# Patient Record
Sex: Female | Born: 1945 | Race: White | Hispanic: No | Marital: Married | Smoking: Never smoker
Health system: Southern US, Community
[De-identification: ages and names within clinical notes are randomized; demographics above are authoritative.]

## PROBLEM LIST (undated history)

## (undated) DIAGNOSIS — M4800 Spinal stenosis, site unspecified: Secondary | ICD-10-CM

## (undated) DIAGNOSIS — M1990 Unspecified osteoarthritis, unspecified site: Secondary | ICD-10-CM

## (undated) DIAGNOSIS — R55 Syncope and collapse: Secondary | ICD-10-CM

## (undated) DIAGNOSIS — R251 Tremor, unspecified: Secondary | ICD-10-CM

## (undated) DIAGNOSIS — E785 Hyperlipidemia, unspecified: Secondary | ICD-10-CM

## (undated) DIAGNOSIS — A879 Viral meningitis, unspecified: Secondary | ICD-10-CM

## (undated) DIAGNOSIS — E063 Autoimmune thyroiditis: Secondary | ICD-10-CM

## (undated) DIAGNOSIS — E039 Hypothyroidism, unspecified: Secondary | ICD-10-CM

## (undated) DIAGNOSIS — Z9889 Other specified postprocedural states: Secondary | ICD-10-CM

## (undated) DIAGNOSIS — C50919 Malignant neoplasm of unspecified site of unspecified female breast: Secondary | ICD-10-CM

## (undated) HISTORY — PX: BREAST SURGERY: SHX581

## (undated) HISTORY — DX: Viral meningitis, unspecified: A87.9

## (undated) HISTORY — DX: Hypothyroidism, unspecified: E03.9

## (undated) HISTORY — PX: FOOT SURGERY: SHX648

## (undated) HISTORY — PX: TONSILLECTOMY: SUR1361

## (undated) HISTORY — DX: Hyperlipidemia, unspecified: E78.5

## (undated) HISTORY — DX: Spinal stenosis, site unspecified: M48.00

## (undated) HISTORY — DX: Unspecified osteoarthritis, unspecified site: M19.90

## (undated) HISTORY — DX: Autoimmune thyroiditis: E06.3

## (undated) HISTORY — DX: Tremor, unspecified: R25.1

## (undated) HISTORY — DX: Other specified postprocedural states: Z98.890

---

## 1983-05-20 DIAGNOSIS — Z9889 Other specified postprocedural states: Secondary | ICD-10-CM

## 1983-05-20 HISTORY — DX: Other specified postprocedural states: Z98.890

## 1983-05-20 HISTORY — PX: KNEE ARTHROSCOPY: SHX127

## 2004-05-19 HISTORY — PX: CHOLECYSTECTOMY: SHX55

## 2004-05-22 ENCOUNTER — Ambulatory Visit: Payer: Self-pay | Admitting: Internal Medicine

## 2004-05-27 ENCOUNTER — Ambulatory Visit: Payer: Self-pay | Admitting: Surgery

## 2004-06-17 ENCOUNTER — Ambulatory Visit: Payer: Self-pay | Admitting: Surgery

## 2004-06-19 ENCOUNTER — Ambulatory Visit: Payer: Self-pay | Admitting: Surgery

## 2005-05-05 ENCOUNTER — Ambulatory Visit: Payer: Self-pay | Admitting: Specialist

## 2005-05-13 ENCOUNTER — Ambulatory Visit: Payer: Self-pay | Admitting: Specialist

## 2005-07-01 ENCOUNTER — Ambulatory Visit: Payer: Self-pay | Admitting: Internal Medicine

## 2006-07-16 ENCOUNTER — Ambulatory Visit: Payer: Self-pay | Admitting: Internal Medicine

## 2007-01-28 ENCOUNTER — Ambulatory Visit: Payer: Self-pay | Admitting: Internal Medicine

## 2007-06-25 ENCOUNTER — Ambulatory Visit: Payer: Self-pay | Admitting: Unknown Physician Specialty

## 2007-07-02 LAB — HM COLONOSCOPY

## 2007-07-19 ENCOUNTER — Ambulatory Visit: Payer: Self-pay | Admitting: Internal Medicine

## 2008-07-20 ENCOUNTER — Ambulatory Visit: Payer: Self-pay | Admitting: Internal Medicine

## 2009-08-16 ENCOUNTER — Ambulatory Visit: Payer: Self-pay | Admitting: Internal Medicine

## 2010-02-25 LAB — HM DEXA SCAN

## 2010-02-26 ENCOUNTER — Other Ambulatory Visit: Payer: Self-pay | Admitting: Internal Medicine

## 2010-02-27 ENCOUNTER — Ambulatory Visit: Payer: Self-pay | Admitting: Internal Medicine

## 2010-03-11 ENCOUNTER — Other Ambulatory Visit: Payer: Self-pay | Admitting: Internal Medicine

## 2010-05-22 ENCOUNTER — Other Ambulatory Visit: Payer: Self-pay | Admitting: Internal Medicine

## 2010-08-14 ENCOUNTER — Ambulatory Visit: Payer: Self-pay | Admitting: Internal Medicine

## 2011-02-18 ENCOUNTER — Other Ambulatory Visit: Payer: Self-pay | Admitting: Internal Medicine

## 2011-03-26 ENCOUNTER — Other Ambulatory Visit: Payer: Self-pay

## 2011-04-02 ENCOUNTER — Ambulatory Visit (INDEPENDENT_AMBULATORY_CARE_PROVIDER_SITE_OTHER): Payer: PRIVATE HEALTH INSURANCE | Admitting: Internal Medicine

## 2011-04-02 ENCOUNTER — Encounter: Payer: Self-pay | Admitting: Internal Medicine

## 2011-04-02 VITALS — BP 110/60 | HR 79 | Temp 98.4°F | Resp 14 | Ht 67.5 in | Wt 158.5 lb

## 2011-04-02 DIAGNOSIS — M8580 Other specified disorders of bone density and structure, unspecified site: Secondary | ICD-10-CM

## 2011-04-02 DIAGNOSIS — E785 Hyperlipidemia, unspecified: Secondary | ICD-10-CM

## 2011-04-02 DIAGNOSIS — G250 Essential tremor: Secondary | ICD-10-CM

## 2011-04-02 DIAGNOSIS — Z124 Encounter for screening for malignant neoplasm of cervix: Secondary | ICD-10-CM

## 2011-04-02 DIAGNOSIS — M48061 Spinal stenosis, lumbar region without neurogenic claudication: Secondary | ICD-10-CM

## 2011-04-02 DIAGNOSIS — M899 Disorder of bone, unspecified: Secondary | ICD-10-CM

## 2011-04-02 DIAGNOSIS — M949 Disorder of cartilage, unspecified: Secondary | ICD-10-CM

## 2011-04-02 DIAGNOSIS — E559 Vitamin D deficiency, unspecified: Secondary | ICD-10-CM

## 2011-04-02 NOTE — Progress Notes (Signed)
Subjective:    Patient ID: Tiffany Chang, female    DOB: 05/19/1946, 66 y.o.   MRN: [id number]  HPI   Past Medical History  Diagnosis Date  . Osteoporosis   . History of arthroscopy of left knee 1985  . Spinal stenosis     mild  . Hyperlipidemia     mild, with prior statin intolerance   Current Outpatient Prescriptions on File Prior to Visit  Medication Sig Dispense Refill  . SYNTHROID 75 MCG tablet TAKE 1 TABLET EVERY DAY  30 tablet  3      Review of Systems    BP 110/60  Pulse 79  Temp(Src) 98.4 F (36.9 C) (Oral)  Resp 14  Ht 5' 7.5" (1.715 m)  Wt 158 lb 8 oz (71.895 kg)  BMI 24.46 kg/m2  SpO2 96%     Physical Exam    Assessment & Plan:   Subjective:    Tiffany Chang is a 65 y.o. female who presents for an annual exam. The patient is sexually active. GYN screening history: last pap: was normal. The patient wears seatbelts: yes. The patient participates in regular exercise: yes. Has the patient ever been transfused or tattooed?: no. The patient reports that there is not domestic violence in her life.  Last seen one year ago.  History of spinal stenosis getting more problematic. Sees T[facility] Chang.  Has periods of daily pain followed by pain free periods.  Teaching her core strength training classes does not aggravate it. Essential tremor is not getting worse, not chronically bothering her hand writing, and she remains able to drink soup out of right hand . Using propanolol.  Taking crestor for hyperlipidemia every other day or every 3 days without myalgias. Needs repeat lipids .  Annual eye exam by Tiffany Chang : vitreous degeneration and hypermetropia.  Last colonoscopy was 2009 by Tiffany Chang was clear , not due yet.   Menstrual History: OB History    Grav Para Term Preterm Abortions TAB SAB Ect Mult Living                   No LMP recorded. Patient is postmenopausal.    The following portions of the patient's history were reviewed and updated as appropriate: allergies,  current medications, past family history, past medical history, past social history, past surgical history and problem list.  Review of Systems A comprehensive review of systems was negative.    Objective:    BP 110/60  Pulse 79  Temp(Src) 98.4 F (36.9 C) (Oral)  Resp 14  Ht 5' 7.5" (1.715 m)  Wt 158 lb 8 oz (71.895 kg)  BMI 24.46 kg/m2  SpO2 96%  General Appearance:    Alert, cooperative, no distress, appears stated age  Head:    Normocephalic, without obvious abnormality, atraumatic  Eyes:    PERRL, conjunctiva/corneas clear, EOM's intact, fundi    benign, both eyes  Ears:    Normal TM's and external ear canals, both ears  Nose:   Nares normal, septum midline, mucosa normal, no drainage    or sinus tenderness  Throat:   Lips, mucosa, and tongue normal; teeth and gums normal  Neck:   Supple, symmetrical, trachea midline, no adenopathy;    thyroid:  no enlargement/tenderness/nodules; no carotid   bruit or JVD  Back:     Symmetric, no curvature, ROM normal, no CVA tenderness  Lungs:     Clear to auscultation bilaterally, respirations unlabored  Chest Wall:    No  tenderness or deformity   Heart:    Regular rate and rhythm, S1 and S2 normal, no murmur, rub   or gallop  Breast Exam:    No tenderness, masses, or nipple abnormality  Abdomen:     Soft, non-tender, bowel sounds active all four quadrants,    no masses, no organomegaly  Genitalia:    Normal female without lesion, discharge or tenderness  Rectal:    Normal tone, normal prostate, no masses or tenderness;   guaiac negative stool  Extremities:   Extremities normal, atraumatic, no cyanosis or edema  Pulses:   2+ and symmetric all extremities  Skin:   Skin color, texture, turgor normal, no rashes or lesions  Lymph nodes:   Cervical, supraclavicular, and axillary nodes normal  Neurologic:   CNII-XII intact, normal strength, sensation and reflexes    throughout  .    Assessment:    Healthy female exam.    Plan:      Await pap smear results. Blood tests: Comprehensive metabolic panel, Lipoproteins, Total cholesterol and TSH. Follow up as needed. Mammogram.

## 2011-04-03 ENCOUNTER — Other Ambulatory Visit (HOSPITAL_COMMUNITY)
Admission: RE | Admit: 2011-04-03 | Discharge: 2011-04-03 | Disposition: A | Discharge status: Home / Self Care | Payer: PRIVATE HEALTH INSURANCE | Source: Ambulatory Visit | Attending: Internal Medicine | Admitting: Internal Medicine

## 2011-04-03 DIAGNOSIS — Z1159 Encounter for screening for other viral diseases: Secondary | ICD-10-CM | POA: Insufficient documentation

## 2011-04-03 DIAGNOSIS — Z01419 Encounter for gynecological examination (general) (routine) without abnormal findings: Secondary | ICD-10-CM | POA: Insufficient documentation

## 2011-04-05 ENCOUNTER — Encounter: Payer: Self-pay | Admitting: Internal Medicine

## 2011-04-05 DIAGNOSIS — E559 Vitamin D deficiency, unspecified: Secondary | ICD-10-CM | POA: Insufficient documentation

## 2011-04-05 DIAGNOSIS — M48061 Spinal stenosis, lumbar region without neurogenic claudication: Secondary | ICD-10-CM | POA: Insufficient documentation

## 2011-04-05 DIAGNOSIS — M810 Age-related osteoporosis without current pathological fracture: Secondary | ICD-10-CM | POA: Insufficient documentation

## 2011-04-05 DIAGNOSIS — E785 Hyperlipidemia, unspecified: Secondary | ICD-10-CM | POA: Insufficient documentation

## 2011-04-05 DIAGNOSIS — M8580 Other specified disorders of bone density and structure, unspecified site: Secondary | ICD-10-CM | POA: Insufficient documentation

## 2011-04-05 DIAGNOSIS — G250 Essential tremor: Secondary | ICD-10-CM | POA: Insufficient documentation

## 2011-04-05 NOTE — Assessment & Plan Note (Signed)
Last DEXA 2011  . T scorees -1.8 Femur and stable

## 2011-04-05 NOTE — Assessment & Plan Note (Addendum)
Using Crestor every 3 days with drop in LDL from 189 to 133 in Jan 2012.

## 2011-04-08 ENCOUNTER — Other Ambulatory Visit: Payer: Self-pay | Admitting: Internal Medicine

## 2011-04-08 LAB — HM PAP SMEAR: HM Pap smear: NORMAL

## 2011-04-09 ENCOUNTER — Encounter: Payer: Self-pay | Admitting: *Deleted

## 2011-05-06 ENCOUNTER — Other Ambulatory Visit: Payer: Self-pay | Admitting: Internal Medicine

## 2011-06-24 ENCOUNTER — Encounter: Payer: Self-pay | Admitting: Internal Medicine

## 2011-07-12 LAB — HM COLONOSCOPY

## 2011-07-16 ENCOUNTER — Telehealth: Payer: Self-pay | Admitting: Internal Medicine

## 2011-07-16 DIAGNOSIS — Z1239 Encounter for other screening for malignant neoplasm of breast: Secondary | ICD-10-CM

## 2011-07-16 NOTE — Telephone Encounter (Signed)
Patient called and stated it is time for her mammogram, she goes to [facility].

## 2011-07-17 ENCOUNTER — Other Ambulatory Visit: Payer: Self-pay | Admitting: *Deleted

## 2011-07-17 MED ORDER — LEVOTHYROXINE SODIUM 75 MCG PO TABS: 75.0000 ug | ORAL_TABLET | Freq: Every day | ORAL | Status: DC

## 2011-07-17 NOTE — Telephone Encounter (Signed)
On printer

## 2011-07-17 NOTE — Telephone Encounter (Signed)
Patient notified she will pick up the order.

## 2011-08-14 ENCOUNTER — Other Ambulatory Visit: Payer: Self-pay | Admitting: Internal Medicine

## 2011-08-14 MED ORDER — ROSUVASTATIN CALCIUM 5 MG PO TABS: 5.0000 mg | ORAL_TABLET | ORAL | Status: DC

## 2011-08-27 ENCOUNTER — Ambulatory Visit: Payer: Self-pay | Admitting: Internal Medicine

## 2011-08-27 LAB — HM MAMMOGRAPHY: HM Mammogram: NORMAL

## 2011-08-28 ENCOUNTER — Telehealth: Payer: Self-pay | Admitting: Internal Medicine

## 2011-08-28 NOTE — Telephone Encounter (Signed)
Mammogram was normal.   

## 2011-08-29 NOTE — Telephone Encounter (Signed)
Patient notified

## 2011-09-05 ENCOUNTER — Encounter: Payer: Self-pay | Admitting: Internal Medicine

## 2011-09-10 ENCOUNTER — Other Ambulatory Visit: Payer: Self-pay | Admitting: Internal Medicine

## 2011-09-10 MED ORDER — PROPRANOLOL HCL 20 MG PO TABS: 20.0000 mg | ORAL_TABLET | Freq: Two times a day (BID) | ORAL | Status: DC

## 2011-11-10 ENCOUNTER — Other Ambulatory Visit: Payer: Self-pay | Admitting: *Deleted

## 2011-11-10 MED ORDER — LEVOTHYROXINE SODIUM 75 MCG PO TABS: 75.0000 ug | ORAL_TABLET | Freq: Every day | ORAL | Status: DC

## 2011-12-09 ENCOUNTER — Other Ambulatory Visit: Payer: Self-pay | Admitting: Internal Medicine

## 2011-12-09 MED ORDER — PROPRANOLOL HCL 20 MG PO TABS: 20.0000 mg | ORAL_TABLET | Freq: Two times a day (BID) | ORAL | Status: DC

## 2012-03-05 ENCOUNTER — Other Ambulatory Visit: Payer: Self-pay | Admitting: *Deleted

## 2012-03-05 MED ORDER — PROPRANOLOL HCL 20 MG PO TABS: 20.0000 mg | ORAL_TABLET | Freq: Two times a day (BID) | ORAL | Status: DC

## 2012-03-05 NOTE — Telephone Encounter (Signed)
R'cd fax from [facility] for refill of Propranolol.

## 2012-03-18 ENCOUNTER — Other Ambulatory Visit: Payer: Self-pay

## 2012-03-18 MED ORDER — LEVOTHYROXINE SODIUM 75 MCG PO TABS: 75.0000 ug | ORAL_TABLET | Freq: Every day | ORAL | Status: DC

## 2012-03-18 NOTE — Telephone Encounter (Signed)
Yes, but please remind patient to make appt forTSH check.and annual PE

## 2012-03-18 NOTE — Telephone Encounter (Signed)
Refill request for Levothyroxine 75 mcg no recent OV. Ok to refill?

## 2012-03-19 NOTE — Telephone Encounter (Signed)
Levothyroxine 75 mcg called in to [facility] pharmacy.

## 2012-06-29 ENCOUNTER — Ambulatory Visit (INDEPENDENT_AMBULATORY_CARE_PROVIDER_SITE_OTHER): Payer: 59 | Admitting: Internal Medicine

## 2012-06-29 ENCOUNTER — Encounter: Payer: Self-pay | Admitting: Internal Medicine

## 2012-06-29 VITALS — BP 112/60 | HR 90 | Temp 98.0°F | Resp 16 | Ht 67.75 in | Wt 162.5 lb

## 2012-06-29 DIAGNOSIS — M8580 Other specified disorders of bone density and structure, unspecified site: Secondary | ICD-10-CM

## 2012-06-29 DIAGNOSIS — Z0000 Encounter for general adult medical examination without abnormal findings: Secondary | ICD-10-CM

## 2012-06-29 DIAGNOSIS — R5381 Other malaise: Secondary | ICD-10-CM

## 2012-06-29 DIAGNOSIS — E785 Hyperlipidemia, unspecified: Secondary | ICD-10-CM

## 2012-06-29 DIAGNOSIS — G250 Essential tremor: Secondary | ICD-10-CM

## 2012-06-29 DIAGNOSIS — Z1239 Encounter for other screening for malignant neoplasm of breast: Secondary | ICD-10-CM

## 2012-06-29 DIAGNOSIS — Z23 Encounter for immunization: Secondary | ICD-10-CM

## 2012-06-29 DIAGNOSIS — E559 Vitamin D deficiency, unspecified: Secondary | ICD-10-CM

## 2012-06-29 DIAGNOSIS — M899 Disorder of bone, unspecified: Secondary | ICD-10-CM

## 2012-06-29 DIAGNOSIS — R5383 Other fatigue: Secondary | ICD-10-CM

## 2012-06-29 DIAGNOSIS — E039 Hypothyroidism, unspecified: Secondary | ICD-10-CM

## 2012-06-29 DIAGNOSIS — Z13220 Encounter for screening for lipoid disorders: Secondary | ICD-10-CM

## 2012-06-29 MED ORDER — ZOSTER VACCINE LIVE 19400 UNT/0.65ML ~~LOC~~ SOLR: 0.6500 mL | Freq: Once | SUBCUTANEOUS | Status: DC

## 2012-06-29 NOTE — Patient Instructions (Addendum)
 This is  my version of a  "Low GI"  Diet:  It is not ultra low carb, but will still lower your blood sugars and allow you to lose 5 to 10 lbs per month if you follow it carefully. All of the foods can be found at grocery stores and in bulk at [employer].  The Atkins protein bars and shakes are available in more varieties at [employer], [employer] and [employer].     7 AM Breakfast:  Low carbohydrate Protein  Shakes (I recommend the EAS AdvantEdge "Carb Control" shakes  Or the low carb shakes by Atkins.   Both are available everywhere:  In  cases at [employer]Js  Or in 4 packs at grocery stores and pharmacies  2.5 carbs  (Alternative is  a toasted Arnold's Sandwhich Thin w/ peanut butter, a "Bagel Thin" with cream cheese and salmon) or  a scrambled egg burrito made with a low carb tortilla .  Avoid cereal and bananas, oatmeal too unless you are cooking the old fashioned kind that takes 30-40 minutes to prepare.  the rest is overly processed, has minimal fiber, and is loaded with carbohydrates!   10 AM: Protein bar by Atkins (the snack size, under 200 cal).  There are many varieties , available widely again or in bulk in limited varieties at BJs)  Other so called "protein bars" tend to be loaded with carbohydrates.  Remember, in food advertising, the word "energy" is synonymous for " carbohydrate."  Lunch: sandwich of turkey, (or any lunchmeat, grilled meat or canned tuna), fresh avocado, mayonnaise  and cheese on a lower carbohydrate pita bread, flatbread, or tortilla . Ok to use regular mayonnaise. The bread is the only source or carbohydrate that can be decreased (Joseph's makes a pita bread and a flat bread that are 50 cal and 4 net carbs ; Toufayan makes a low carb flatbread that's 100 cal and 9 net carbs  and  Mission makes a low carb whole wheat tortilla  That is 210 cal and 6 net carbs)  3 PM:  Mid day :  Another protein bar,  Or a  cheese stick (100 cal, 0 carbs),  Or 1 ounce of  almonds, walnuts, pistachios,  pecans, peanuts,  Macadamia nuts. Or a Dannon light n Fit greek yogurt, 80 cal 8 net carbs . Avoid "granola"; the dried cranberries and raisins are loaded with carbohydrates. Mixed nuts ok if no raisins or cranberries or dried fruit.      6 PM  Dinner:  "mean and green:"  Meat/chicken/fish or a high protein legume; , with a green salad, and a low GI  Veggie (broccoli, cauliflower, green beans, spinach, brussel sprouts. Lima beans) : Avoid "Low fat dressings, as well as [place] and [place]! They are loaded with sugar! Instead use ranch, vinagrette,  Blue cheese, etc.  There is a low carb pasta by Dreamfield's available at [employer] that is acceptable and tastes great. Try Michel Angel's chicken piccata over low carb pasta. The chicken dish is 0 carbs, and can be found in frozen section at BJs and [employer]. Also try Aaron Sanchez's "Carnitas" (pulled pork, no sauce,  0 carbs) and his pot roast.   both are in the refrigerated section at BJs   Dreamfield's makes a low carb pasta only 5 g/serving.  Available at all grocery stores,  And tastes like normal pasta  9 PM snack : Breyer's "low carb" fudgsicle or  ice cream bar (Carb Smart   line), or  Weight Watcher's ice cream bar , or another "no sugar added" ice cream;a serving of fresh berries/cherries with whipped cream (Avoid bananas, pineapple, grapes  and watermelon on a regular basis because they are high in sugar)   Remember that snack Substitutions should be less than 10 carbs per serving and meals < 20 carbs. Remember to subtract fiber grams and sugar alcohols to get the "net carbs."  

## 2012-06-29 NOTE — Progress Notes (Signed)
Patient ID: Tiffany Chang, female   DOB: Mar 31, 1946, 67 y.o.   MRN: [id number]    Subjective:     Tiffany Chang is a 67 y.o. female and is here for a comprehensive physical exam. The patient reports the following:.Weight gain. She has a very healthy diet and is physically active but admits that she is not "pushing " herself in her workouts to the point of breathing hard .  No other complaints today.   BMI is < 25   Colonoscopy is planned for march by Tiffany Chang for follow up on polyps .  Her PAP smear is not due until 2015.  Her mammogram is due in April .  She has had her annual dermatology exam by Dr. Roseanne [facility] at [facility] .    History   Social History  . Marital Status: Married    Spouse Name: N/A    Number of Children: N/A  . Years of Education: N/A   Occupational History  . Not on file.   Social History Main Topics  . Smoking status: Never Smoker   . Smokeless tobacco: Never Used  . Alcohol Use: 1.5 oz/week    3 drink(s) per week     Comment: social  . Drug Use: No  . Sexually Active: Not on file   Other Topics Concern  . Not on file   Social History Narrative  . No narrative on file   Health Maintenance  Topic Date Due  . Colonoscopy  01/31/1998  . Zostavax  02/01/2008  . Influenza Vaccine  01/17/2013  . Mammogram  08/26/2013  . Pap Smear  04/02/2014  . Tetanus/tdap  06/29/2022    The following portions of the patient's history were reviewed and updated as appropriate: allergies, current medications, past family history, past medical history, past social history, past surgical history and problem list.  Review of Systems A comprehensive review of systems was negative.   Objective:  BP 112/60  Pulse 90  Temp(Src) 98 F (36.7 C) (Oral)  Resp 16  Ht 5' 7.75" (1.721 m)  Wt 162 lb 8 oz (73.71 kg)  BMI 24.89 kg/m2  SpO2 98%  General Appearance:    Alert, cooperative, no distress, appears stated age  Head:    Normocephalic, without obvious abnormality, atraumatic   Eyes:    PERRL, conjunctiva/corneas clear, EOM's intact, fundi    benign, both eyes  Ears:    Normal TM's and external ear canals, both ears  Nose:   Nares normal, septum midline, mucosa normal, no drainage    or sinus tenderness  Throat:   Lips, mucosa, and tongue normal; teeth and gums normal  Neck:   Supple, symmetrical, trachea midline, no adenopathy;    thyroid:  no enlargement/tenderness/nodules; no carotid   bruit or JVD  Back:     Symmetric, no curvature, ROM normal, no CVA tenderness  Lungs:     Clear to auscultation bilaterally, respirations unlabored  Chest Wall:    No tenderness or deformity   Heart:    Regular rate and rhythm, S1 and S2 normal, no murmur, rub   or gallop  Abdomen:     Soft, non-tender, bowel sounds active all four quadrants,    no masses, no organomegaly  Extremities:   Extremities normal, atraumatic, no cyanosis or edema  Pulses:   2+ and symmetric all extremities  Skin:   Skin color, texture, turgor normal, no rashes or lesions  Lymph nodes:   Cervical, supraclavicular, and axillary nodes normal  Neurologic:   CNII-XII intact, normal strength, sensation and reflexes    throughout      Assessment:   Unspecified vitamin D deficiency Continue weekly Drisdol given osteopenia.   Hyperlipidemia Tolerating low dose QOD crestor . repeat lfts and lipids are due.   Osteopenia By last DEXA 2011 and prior use of Evista for 5 years.  Will repeat DEXA this year and if T scores have dropped significantly will consider medication.   Benign essential tremor She inquired about the need for a referral to a neurologist since [facility] neurologist was a specialist in movement disorders.  Hewr tremor is nonprogressive and not suggestive of Parkinson's.  I advised against referral unless she was concerned or wanting a second opinion.   Routine general medical examination at a health care facility Annual comprehensive exam excluding breast , pelvic and PAP smear was  done today. Screenings were all brought up to date. She will return for fasting labs.    Updated Medication List Outpatient Encounter Prescriptions as of 06/29/2012  Medication Sig Dispense Refill  . aspirin 81 MG tablet Take 81 mg by mouth daily.        . ergocalciferol (VITAMIN D2) 50000 UNITS capsule Take 50,000 Units by mouth once a week.        . levothyroxine (SYNTHROID) 75 MCG tablet Take 1 tablet (75 mcg total) by mouth daily.  30 tablet  3  . propranolol (INDERAL) 20 MG tablet Take 1 tablet (20 mg total) by mouth 2 (two) times daily.  60 tablet  3  . rosuvastatin (CRESTOR) 5 MG tablet Take 1 tablet (5 mg total) by mouth every other day.  30 tablet  3  . zoster vaccine live, PF, (ZOSTAVAX) 96045 UNT/0.65ML injection Inject 19,400 Units into the skin once.  1 vial  0   No facility-administered encounter medications on file as of 06/29/2012.

## 2012-07-01 ENCOUNTER — Encounter: Payer: Self-pay | Admitting: Internal Medicine

## 2012-07-01 DIAGNOSIS — Z0000 Encounter for general adult medical examination without abnormal findings: Secondary | ICD-10-CM | POA: Insufficient documentation

## 2012-07-01 NOTE — Assessment & Plan Note (Signed)
By last DEXA 2011 and prior use of Evista for 5 years.  Will repeat DEXA this year and if T scores have dropped significantly will consider medication.

## 2012-07-01 NOTE — Assessment & Plan Note (Signed)
Annual comprehensive exam excluding breast , pelvic and PAP smear was done today. Screenings were all brought up to date. She will return for fasting labs.

## 2012-07-01 NOTE — Assessment & Plan Note (Signed)
Continue weekly Drisdol given osteopenia.

## 2012-07-01 NOTE — Assessment & Plan Note (Addendum)
Tolerating low dose QOD crestor . repeat lfts and lipids are due.

## 2012-07-01 NOTE — Assessment & Plan Note (Signed)
She inquired about the need for a referral to a neurologist since [facility] neurologist was a specialist in movement disorders.  Hewr tremor is nonprogressive and not suggestive of Parkinson's.  I advised against referral unless she was concerned or wanting a second opinion.

## 2012-07-06 ENCOUNTER — Other Ambulatory Visit: Payer: Self-pay | Admitting: General Practice

## 2012-07-06 MED ORDER — PROPRANOLOL HCL 20 MG PO TABS: 20.0000 mg | ORAL_TABLET | Freq: Two times a day (BID) | ORAL | Status: DC

## 2012-07-06 MED ORDER — LEVOTHYROXINE SODIUM 75 MCG PO TABS: 75.0000 ug | ORAL_TABLET | Freq: Every day | ORAL | Status: DC

## 2012-07-06 NOTE — Telephone Encounter (Signed)
meds filled per pt.

## 2012-07-08 ENCOUNTER — Other Ambulatory Visit (INDEPENDENT_AMBULATORY_CARE_PROVIDER_SITE_OTHER): Payer: 59

## 2012-07-08 DIAGNOSIS — R5383 Other fatigue: Secondary | ICD-10-CM

## 2012-07-08 DIAGNOSIS — R5381 Other malaise: Secondary | ICD-10-CM

## 2012-07-08 DIAGNOSIS — E559 Vitamin D deficiency, unspecified: Secondary | ICD-10-CM

## 2012-07-08 DIAGNOSIS — E039 Hypothyroidism, unspecified: Secondary | ICD-10-CM

## 2012-07-08 DIAGNOSIS — Z13220 Encounter for screening for lipoid disorders: Secondary | ICD-10-CM

## 2012-07-08 LAB — CBC WITH DIFFERENTIAL/PLATELET
Basophils Absolute: 0 10*3/uL (ref 0.0–0.1)
Basophils Relative: 0.6 % (ref 0.0–3.0)
Eosinophils Absolute: 0.2 10*3/uL (ref 0.0–0.7)
Eosinophils Relative: 4.4 % (ref 0.0–5.0)
HCT: 38.9 % (ref 36.0–46.0)
Hemoglobin: 13.1 g/dL (ref 12.0–15.0)
Lymphocytes Relative: 28.4 % (ref 12.0–46.0)
Lymphs Abs: 1.4 10*3/uL (ref 0.7–4.0)
MCHC: 33.8 g/dL (ref 30.0–36.0)
MCV: 87.1 fl (ref 78.0–100.0)
Monocytes Absolute: 0.3 10*3/uL (ref 0.1–1.0)
Monocytes Relative: 6.4 % (ref 3.0–12.0)
Neutro Abs: 2.9 10*3/uL (ref 1.4–7.7)
Neutrophils Relative %: 60.2 % (ref 43.0–77.0)
Platelets: 325 10*3/uL (ref 150.0–400.0)
RBC: 4.47 Mil/uL (ref 3.87–5.11)
RDW: 13.9 % (ref 11.5–14.6)
WBC: 4.8 10*3/uL (ref 4.5–10.5)

## 2012-07-08 LAB — LDL CHOLESTEROL, DIRECT: Direct LDL: 169.6 mg/dL

## 2012-07-08 LAB — TSH: TSH: 6.1 u[IU]/mL — ABNORMAL HIGH (ref 0.35–5.50)

## 2012-07-08 LAB — COMPREHENSIVE METABOLIC PANEL
ALT: 19 U/L (ref 0–35)
AST: 18 U/L (ref 0–37)
Albumin: 3.9 g/dL (ref 3.5–5.2)
Alkaline Phosphatase: 81 U/L (ref 39–117)
BUN: 21 mg/dL (ref 6–23)
CO2: 28 mEq/L (ref 19–32)
Calcium: 9.2 mg/dL (ref 8.4–10.5)
Chloride: 106 mEq/L (ref 96–112)
Creatinine, Ser: 0.8 mg/dL (ref 0.4–1.2)
GFR: 80.1 mL/min (ref >60.00)
Glucose, Bld: 100 mg/dL — ABNORMAL HIGH (ref 70–99)
Potassium: 4.5 mEq/L (ref 3.5–5.1)
Sodium: 140 mEq/L (ref 135–145)
Total Bilirubin: 0.5 mg/dL (ref 0.3–1.2)
Total Protein: 6.9 g/dL (ref 6.0–8.3)

## 2012-07-08 LAB — LIPID PANEL
Cholesterol: 255 mg/dL — ABNORMAL HIGH (ref 0–200)
HDL: 36.7 mg/dL — ABNORMAL LOW (ref >39.00)
Total CHOL/HDL Ratio: 7
Triglycerides: 149 mg/dL (ref 0.0–149.0)
VLDL: 29.8 mg/dL (ref 0.0–40.0)

## 2012-07-09 ENCOUNTER — Other Ambulatory Visit: Payer: Self-pay | Admitting: General Practice

## 2012-07-09 LAB — VITAMIN D 25 HYDROXY (VIT D DEFICIENCY, FRACTURES): Vit D, 25-Hydroxy: 39 ng/mL (ref 30–89)

## 2012-07-09 MED ORDER — LEVOTHYROXINE SODIUM 88 MCG PO TABS: 88.0000 ug | ORAL_TABLET | Freq: Every day | ORAL | Status: DC

## 2012-07-09 MED ORDER — ROSUVASTATIN CALCIUM 5 MG PO TABS: 5.0000 mg | ORAL_TABLET | ORAL | Status: DC

## 2012-07-09 NOTE — Addendum Note (Signed)
Addended by: Sherlene Shams on: 07/09/2012 06:24 AM   Modules accepted: Orders

## 2012-07-26 ENCOUNTER — Ambulatory Visit: Payer: Self-pay | Admitting: Unknown Physician Specialty

## 2012-07-27 LAB — PATHOLOGY REPORT

## 2012-08-23 ENCOUNTER — Encounter: Payer: Self-pay | Admitting: Internal Medicine

## 2012-08-24 ENCOUNTER — Other Ambulatory Visit (INDEPENDENT_AMBULATORY_CARE_PROVIDER_SITE_OTHER): Payer: 59

## 2012-08-24 DIAGNOSIS — E039 Hypothyroidism, unspecified: Secondary | ICD-10-CM

## 2012-08-24 DIAGNOSIS — Z13220 Encounter for screening for lipoid disorders: Secondary | ICD-10-CM

## 2012-08-24 LAB — LDL CHOLESTEROL, DIRECT: Direct LDL: 129 mg/dL

## 2012-08-24 LAB — TSH: TSH: 1.01 u[IU]/mL (ref 0.35–5.50)

## 2012-08-30 ENCOUNTER — Ambulatory Visit: Payer: Self-pay | Admitting: Internal Medicine

## 2012-09-08 ENCOUNTER — Encounter: Payer: Self-pay | Admitting: Internal Medicine

## 2012-11-04 ENCOUNTER — Other Ambulatory Visit: Payer: Self-pay | Admitting: *Deleted

## 2012-11-04 MED ORDER — LEVOTHYROXINE SODIUM 88 MCG PO TABS: 88.0000 ug | ORAL_TABLET | Freq: Every day | ORAL | Status: DC

## 2012-11-04 MED ORDER — PROPRANOLOL HCL 20 MG PO TABS: 20.0000 mg | ORAL_TABLET | Freq: Two times a day (BID) | ORAL | Status: DC

## 2013-03-24 ENCOUNTER — Other Ambulatory Visit: Payer: Self-pay

## 2013-05-02 ENCOUNTER — Other Ambulatory Visit: Payer: Self-pay | Admitting: Internal Medicine

## 2013-07-05 ENCOUNTER — Encounter: Payer: 59 | Admitting: Internal Medicine

## 2013-07-11 ENCOUNTER — Encounter: Payer: Self-pay | Admitting: Internal Medicine

## 2013-07-11 ENCOUNTER — Ambulatory Visit (INDEPENDENT_AMBULATORY_CARE_PROVIDER_SITE_OTHER): Payer: 59 | Admitting: Internal Medicine

## 2013-07-11 VITALS — BP 120/68 | HR 67 | Temp 98.4°F | Resp 16 | Ht 67.5 in | Wt 157.8 lb

## 2013-07-11 DIAGNOSIS — Z79899 Other long term (current) drug therapy: Secondary | ICD-10-CM

## 2013-07-11 DIAGNOSIS — G252 Other specified forms of tremor: Secondary | ICD-10-CM

## 2013-07-11 DIAGNOSIS — Z1239 Encounter for other screening for malignant neoplasm of breast: Secondary | ICD-10-CM

## 2013-07-11 DIAGNOSIS — M8580 Other specified disorders of bone density and structure, unspecified site: Secondary | ICD-10-CM

## 2013-07-11 DIAGNOSIS — E559 Vitamin D deficiency, unspecified: Secondary | ICD-10-CM

## 2013-07-11 DIAGNOSIS — M949 Disorder of cartilage, unspecified: Secondary | ICD-10-CM

## 2013-07-11 DIAGNOSIS — G250 Essential tremor: Secondary | ICD-10-CM

## 2013-07-11 DIAGNOSIS — Z0000 Encounter for general adult medical examination without abnormal findings: Secondary | ICD-10-CM

## 2013-07-11 DIAGNOSIS — R5383 Other fatigue: Secondary | ICD-10-CM

## 2013-07-11 DIAGNOSIS — Z8249 Family history of ischemic heart disease and other diseases of the circulatory system: Secondary | ICD-10-CM

## 2013-07-11 DIAGNOSIS — M899 Disorder of bone, unspecified: Secondary | ICD-10-CM

## 2013-07-11 DIAGNOSIS — R7309 Other abnormal glucose: Secondary | ICD-10-CM

## 2013-07-11 DIAGNOSIS — Z23 Encounter for immunization: Secondary | ICD-10-CM

## 2013-07-11 DIAGNOSIS — Z124 Encounter for screening for malignant neoplasm of cervix: Secondary | ICD-10-CM

## 2013-07-11 DIAGNOSIS — R739 Hyperglycemia, unspecified: Secondary | ICD-10-CM

## 2013-07-11 DIAGNOSIS — E785 Hyperlipidemia, unspecified: Secondary | ICD-10-CM

## 2013-07-11 DIAGNOSIS — Z136 Encounter for screening for cardiovascular disorders: Secondary | ICD-10-CM

## 2013-07-11 DIAGNOSIS — R5381 Other malaise: Secondary | ICD-10-CM

## 2013-07-11 MED ORDER — ZOSTER VACCINE LIVE 19400 UNT/0.65ML ~~LOC~~ SOLR: 0.6500 mL | Freq: Once | SUBCUTANEOUS | Status: DC

## 2013-07-11 NOTE — Assessment & Plan Note (Signed)
Would like neurology evaluation .  Referral to Dr. [name] discussed.

## 2013-07-11 NOTE — Patient Instructions (Signed)
You had your annual Medicare wellness exam today  We will schedule your mammogram, your bone density tests soon.  Referrals to Dr Tat and Dr Lucky Cowboy for 1) tremor evaluaton 2) aortic aneurysm screening   I do recommend a Shingles vaccine.  I have given you prescription for this because it will cost less elsewhere since  Medicare will not reimburse for it   You received the pneumonia vaccine today.  Return for fasting labs, We will contact you with the bloodwork results

## 2013-07-11 NOTE — Progress Notes (Signed)
Patient ID: Tiffany Chang, female   DOB: 10-26-1945, 68 y.o.   MRN: [id number]  The patient is here for her "Welcome to  Medicare"  wellness examination and management of other chronic and acute problems:    Hyperlipidmeia.  Has been taking crestor gwice weekly with good tolerance.    Concerned that she has "pre diabetes" due to a recent screening a1c 5.7 via fingerstick   BET:  Wants to see [name] for evaluation of her BET   History of AA in family member : Father had a ruptured AAA and whe would like to have aortic ultrasound/eval by [facility].    No prior cardiovascular screen.  Exercises regularly,  No risk factors. No chest pain  Frustrated at how much effort it takes to maintain healthy weight.  Feels it is hormonally driven since she had no problems premenopasue.     The risk factors are reflected in the social history.  The roster of all physicians providing medical care to patient - is listed in the Snapshot section of the chart.  Activities of daily living:  The patient is 100% independent in all ADLs: dressing, toileting, feeding as well as independent mobility  Home safety : The patient has smoke detectors in the home. They wear seatbelts.  There are no firearms at home. There is no violence in the home.   There is no risks for hepatitis, STDs or HIV. There is no   history of blood transfusion. They have no travel history to infectious disease endemic areas of the world.  The patient has seen their dentist in the last six month. They have seen their eye doctor in the last year. They admit to slight hearing difficulty with regard to whispered voices and some television programs.  They have deferred audiologic testing in the last year.  They do not  have excessive sun exposure. Discussed the need for sun protection: hats, long sleeves and use of sunscreen if there is significant sun exposure.   Diet: the importance of a healthy diet is discussed. They do have a healthy  diet.  The benefits of regular aerobic exercise were discussed. She walks 4 times per week ,  20 minutes.   Depression screen: there are no signs or vegative symptoms of depression- irritability, change in appetite, anhedonia, sadness/tearfullness.  Cognitive assessment: the patient manages all their financial and personal affairs and is actively engaged. They could relate day,date,year and events; recalled 2/3 objects at 3 minutes; performed clock-face test normally.  The following portions of the patient's history were reviewed and updated as appropriate: allergies, current medications, past family history, past medical history,  past surgical history, past social history  and problem list.  Visual acuity was not assessed per patient preference since she has regular follow up with her ophthalmologist. Hearing and body mass index were assessed and reviewed.   During the course of the visit the patient was educated and counseled about appropriate screening and preventive services including : fall prevention , diabetes screening, nutrition counseling, colorectal cancer screening, and recommended immunizations.    Objective:   BP 120/68  Pulse 67  Temp(Src) 98.4 F (36.9 C) (Oral)  Resp 16  Ht 5' 7.5" (1.715 m)  Wt 157 lb 12 oz (71.555 kg)  BMI 24.33 kg/m2  SpO2 97%  General Appearance:    Alert, cooperative, no distress, appears stated age  Head:    Normocephalic, without obvious abnormality, atraumatic  Eyes:    PERRL, conjunctiva/corneas clear, EOM's  intact, fundi    benign, both eyes  Ears:    Normal TM's and external ear canals, both ears  Nose:   Nares normal, septum midline, mucosa normal, no drainage    or sinus tenderness  Throat:   Lips, mucosa, and tongue normal; teeth and gums normal  Neck:   Supple, symmetrical, trachea midline, no adenopathy;    thyroid:  no enlargement/tenderness/nodules; no carotid   bruit or JVD  Back:     Symmetric, no curvature, ROM normal, no  CVA tenderness  Lungs:     Clear to auscultation bilaterally, respirations unlabored  Chest Wall:    No tenderness or deformity   Heart:    Regular rate and rhythm, S1 and S2 normal, no murmur, rub   or gallop  Breast Exam:    No tenderness, masses, or nipple abnormality  Abdomen:     Soft, non-tender, bowel sounds active all four quadrants,    no masses, no organomegaly  Genitalia:    Pelvic: cervix normal in appearance, external genitalia normal, no adnexal masses or tenderness, no cervical motion tenderness, rectovaginal septum normal, uterus normal size, shape, and consistency and vagina normal without discharge  Extremities:   Extremities normal, atraumatic, no cyanosis or edema  Pulses:   2+ and symmetric all extremities  Skin:   Skin color, texture, turgor normal, no rashes or lesions  Lymph nodes:   Cervical, supraclavicular, and axillary nodes normal  Neurologic:   CNII-XII intact, normal strength, sensation and reflexes    throughout    Assessment and Plan:  Benign essential tremor Would like neurology evaluation .  Referral to Dr. [name] discussed.   Hyperlipidemia Tolerating crestor twice weekly  Fasting labs due.   Osteopenia Needs follow up DEXA   Family history of abdominal aortic aneurysm Referral to [facility] for evaluation .,  Exam was normal   Encounter for initial preventive physical examination covered by Medicare Annual comprehensive exam was done including breast, pelvic and PAP smear. All screenings have been addressed .    Updated Medication List Outpatient Encounter Prescriptions as of 07/11/2013  Medication Sig  . aspirin 81 MG tablet Take 81 mg by mouth daily.    . ergocalciferol (VITAMIN D2) 50000 UNITS capsule Take 50,000 Units by mouth once a week.    . levothyroxine (SYNTHROID, LEVOTHROID) 88 MCG tablet Take 1 tablet (88 mcg total) by mouth daily.  . propranolol (INDERAL) 20 MG tablet TAKE ONE TABLET TWICE A DAY  . rosuvastatin (CRESTOR) 5  MG tablet Take 1 tablet (5 mg total) by mouth every other day.  . zoster vaccine live, PF, (ZOSTAVAX) [id number] UNT/0.65ML injection Inject 19,400 Units into the skin once.  . zoster vaccine live, PF, (ZOSTAVAX) [id number] UNT/0.65ML injection Inject 19,400 Units into the skin once.

## 2013-07-12 ENCOUNTER — Other Ambulatory Visit (HOSPITAL_COMMUNITY)
Admission: RE | Admit: 2013-07-12 | Discharge: 2013-07-12 | Disposition: A | Discharge status: Home / Self Care | Payer: 59 | Source: Ambulatory Visit | Attending: Internal Medicine | Admitting: Internal Medicine

## 2013-07-12 DIAGNOSIS — Z1151 Encounter for screening for human papillomavirus (HPV): Secondary | ICD-10-CM | POA: Insufficient documentation

## 2013-07-12 DIAGNOSIS — Z8249 Family history of ischemic heart disease and other diseases of the circulatory system: Secondary | ICD-10-CM | POA: Insufficient documentation

## 2013-07-12 DIAGNOSIS — Z01419 Encounter for gynecological examination (general) (routine) without abnormal findings: Secondary | ICD-10-CM | POA: Insufficient documentation

## 2013-07-12 DIAGNOSIS — Z0000 Encounter for general adult medical examination without abnormal findings: Secondary | ICD-10-CM | POA: Insufficient documentation

## 2013-07-12 NOTE — Assessment & Plan Note (Signed)
Tolerating crestor twice weekly  Fasting labs due.

## 2013-07-12 NOTE — Assessment & Plan Note (Signed)
Needs follow up DEXA

## 2013-07-12 NOTE — Assessment & Plan Note (Signed)
Annual comprehensive exam was done including breast, pelvic and PAP smear. All screenings have been addressed .  

## 2013-07-12 NOTE — Assessment & Plan Note (Signed)
Referral to [facility] for evaluation .,  Exam was normal

## 2013-07-13 ENCOUNTER — Other Ambulatory Visit: Payer: 59

## 2013-07-15 ENCOUNTER — Encounter: Payer: Self-pay | Admitting: Internal Medicine

## 2013-07-18 NOTE — Telephone Encounter (Signed)
Mailed unread MyChart message to pt  

## 2013-07-24 ENCOUNTER — Telehealth: Payer: Self-pay | Admitting: Internal Medicine

## 2013-07-24 NOTE — Telephone Encounter (Signed)
Abdominal aortic ultrasound was normal.

## 2013-07-25 NOTE — Telephone Encounter (Signed)
Left message, notifying pt of results 

## 2013-07-26 ENCOUNTER — Encounter: Payer: Self-pay | Admitting: Emergency Medicine

## 2013-07-26 ENCOUNTER — Encounter: Payer: Self-pay | Admitting: Neurology

## 2013-07-26 ENCOUNTER — Ambulatory Visit (INDEPENDENT_AMBULATORY_CARE_PROVIDER_SITE_OTHER): Payer: 59 | Admitting: Neurology

## 2013-07-26 VITALS — BP 130/76 | HR 80 | Resp 14 | Ht 67.5 in | Wt 159.0 lb

## 2013-07-26 DIAGNOSIS — G252 Other specified forms of tremor: Secondary | ICD-10-CM

## 2013-07-26 DIAGNOSIS — G250 Essential tremor: Secondary | ICD-10-CM

## 2013-07-26 NOTE — Progress Notes (Signed)
Subjective:    Tiffany Chang was seen in consultation in the movement disorder clinic at the request of Deborra Medina, MD.  The evaluation is for tremor.  She was previously seen by Dr. Loletta Specter at the [facility] in 2009.  I do have a note from him from May, 2009.  The patient is a 68 y.o. right handed female with a history of tremor.  Tremor began in approximately 9-10 years ago.  She first noted it in her R hand after a long trip to [place].  It is present with activation.  She went to Dr. Loletta Specter and was dx with ET.  She was started on propranolol 20 mg bid and it helped.  It is still helping and it hasn't really progressing.  The tremor is mainly in her R hand.  No tremor in the head or the legs.  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  There is a family hx of tremor due to PD in paternal grandmother and uncle.  Her balance is good.    Affected by caffeine:  no Affected by alcohol:  no Affected by stress:  no Affected by fatigue:  no Spills soup if on spoon:  no but she can notice the shake and has to concentrate if has peas on a spoon Spills glass of liquid if full:  no Affects ADL's (tying shoes, brushing teeth, etc):  no  Current/Previously tried tremor medications: propranolol  Current medications that may exacerbate tremor:  n/a  Outside reports reviewed: historical medical records, office notes and referral letter/letters.  Allergies  Allergen Reactions  . Statins     Leg cramps    Current Outpatient Prescriptions on File Prior to Visit  Medication Sig Dispense Refill  . aspirin 81 MG tablet Take 81 mg by mouth daily.        Marland Kitchen levothyroxine (SYNTHROID, LEVOTHROID) 88 MCG tablet Take 1 tablet (88 mcg total) by mouth daily.  30 tablet  9  . propranolol (INDERAL) 20 MG tablet TAKE ONE TABLET TWICE A DAY  60 tablet  3   No current facility-administered medications on file prior to visit.    Past Medical History  Diagnosis Date  . History of  arthroscopy of left knee 1985  . Spinal stenosis     mild  . Hyperlipidemia     mild, with prior statin intolerance  . Osteoporosis     prior Evista use x 5 yrs , 2007  . Hypothyroid   . Tremor   . Viral meningitis     68 years old  . Hashimoto's thyroiditis     Past Surgical History  Procedure Laterality Date  . Foot surgery      plantar fascitis, Dr. Elvina Mattes  . Knee arthroscopy  1985    right  . Cholecystectomy  2006  . Tonsillectomy    . Cesarean section      History   Social History  . Marital Status: Married    Spouse Name: N/A    Number of Children: N/A  . Years of Education: N/A   Occupational History  .      exercise physiologist   Social History Main Topics  . Smoking status: Never Smoker   . Smokeless tobacco: Never Used  . Alcohol Use: 1.5 oz/week    3 drink(s) per week     Comment: social, 2 times per week  . Drug Use: No  . Sexual Activity: Not on file   Other Topics  Concern  . Not on file   Social History Narrative  . No narrative on file    Family Status  Relation Status Death Age  . Mother Alive 63    htn, has pacemaker  . Father Deceased 59    AAA  . Sister Alive     breast cancer  . Sister Alive   . Sister Alive   . Son Alive     melanoma  . Daughter Alive     Review of Systems A complete 10 system ROS was obtained and was negative apart from what is mentioned.   Objective:   VITALS:   Filed Vitals:   07/26/13 1305  BP: 130/76  Pulse: 80  Resp: 14  Height: 5' 7.5" (1.715 m)  Weight: 159 lb (72.122 kg)   Gen:  Appears stated age and in NAD. HEENT:  Normocephalic, atraumatic. The mucous membranes are moist. The superficial temporal arteries are without ropiness or tenderness. Cardiovascular: Regular rate and rhythm. Lungs: Clear to auscultation bilaterally. Neck: There are no carotid bruits noted bilaterally.  NEUROLOGICAL:  Orientation:  The patient is alert and oriented x 3.  Recent and remote memory are  intact.  Attention span and concentration are normal.  Able to name objects and repeat without trouble.  Fund of knowledge is appropriate Cranial nerves: There is good facial symmetry. The pupils are equal round and reactive to light bilaterally. Fundoscopic exam reveals clear disc margins bilaterally. Extraocular muscles are intact and visual fields are full to confrontational testing. Speech is fluent and clear. Soft palate rises symmetrically and there is no tongue deviation. Hearing is intact to conversational tone. Tone: Tone is good throughout. Sensation: Sensation is intact to light touch and pinprick throughout (facial, trunk, extremities). Vibration is intact at the bilateral big toe. There is no extinction with double simultaneous stimulation. There is no sensory dermatomal level identified. Coordination:  The patient has no dysdiadichokinesia or dysmetria. Motor: Strength is 5/5 in the bilateral upper and lower extremities.  Shoulder shrug is equal bilaterally.  There is no pronator drift.  There are no fasciculations noted. DTR's: Deep tendon reflexes are 2/4 at the bilateral biceps, triceps, brachioradialis, patella and achilles.  Plantar responses are downgoing bilaterally. Gait and Station: The patient is able to ambulate without difficulty. The patient is able to heel toe walk without any difficulty. The patient is able to ambulate in a tandem fashion. The patient is able to stand in the Romberg position.   MOVEMENT EXAM: Tremor:  There is mild tremor in the RUE, noted most significantly with action.  It is notable with intention on the R, and most noteable with writing a sentence in cursive.  Print was not affected.  She had trouble with archimedes spirals on the R. The patient is able to pour water from one glass to another without spilling it but tremor was evident on the R     Assessment/Plan:   1.  Tremor.  -I think that the differential diagnosis lies between dystonic tremor and  essential tremor.  It is somewhat asymmetric, although essential tremor can be that way.  Nonetheless, the treatments really are the same and she is doing good on her propranolol, 20 mg twice a day.  It is a relatively low dose and she is side effect free.  She is able to continue to exercise.  She and I talked about risks and benefits of medication as well as possible progression of tremor.  She really has not  seen any progression, and I do not really recommend anything further.  I gave her some patient information on tremor, including information on the international essential trem[employer].  I will see her back on a prn basis.

## 2013-08-04 ENCOUNTER — Telehealth: Payer: Self-pay | Admitting: Internal Medicine

## 2013-08-04 ENCOUNTER — Other Ambulatory Visit (INDEPENDENT_AMBULATORY_CARE_PROVIDER_SITE_OTHER): Payer: 59

## 2013-08-04 DIAGNOSIS — R5383 Other fatigue: Secondary | ICD-10-CM

## 2013-08-04 DIAGNOSIS — R739 Hyperglycemia, unspecified: Secondary | ICD-10-CM

## 2013-08-04 DIAGNOSIS — Z79899 Other long term (current) drug therapy: Secondary | ICD-10-CM

## 2013-08-04 DIAGNOSIS — G250 Essential tremor: Secondary | ICD-10-CM

## 2013-08-04 DIAGNOSIS — M8580 Other specified disorders of bone density and structure, unspecified site: Secondary | ICD-10-CM

## 2013-08-04 DIAGNOSIS — M899 Disorder of bone, unspecified: Secondary | ICD-10-CM

## 2013-08-04 DIAGNOSIS — M949 Disorder of cartilage, unspecified: Secondary | ICD-10-CM

## 2013-08-04 DIAGNOSIS — G252 Other specified forms of tremor: Secondary | ICD-10-CM

## 2013-08-04 DIAGNOSIS — Z0000 Encounter for general adult medical examination without abnormal findings: Secondary | ICD-10-CM

## 2013-08-04 DIAGNOSIS — E559 Vitamin D deficiency, unspecified: Secondary | ICD-10-CM

## 2013-08-04 DIAGNOSIS — R7309 Other abnormal glucose: Secondary | ICD-10-CM

## 2013-08-04 DIAGNOSIS — R5381 Other malaise: Secondary | ICD-10-CM

## 2013-08-04 DIAGNOSIS — E785 Hyperlipidemia, unspecified: Secondary | ICD-10-CM

## 2013-08-04 LAB — CBC WITH DIFFERENTIAL/PLATELET
Basophils Absolute: 0 10*3/uL (ref 0.0–0.1)
Basophils Relative: 0.3 % (ref 0.0–3.0)
Eosinophils Absolute: 0.2 10*3/uL (ref 0.0–0.7)
Eosinophils Relative: 2.7 % (ref 0.0–5.0)
HCT: 40.2 % (ref 36.0–46.0)
Hemoglobin: 13.5 g/dL (ref 12.0–15.0)
Lymphocytes Relative: 21.1 % (ref 12.0–46.0)
Lymphs Abs: 1.4 10*3/uL (ref 0.7–4.0)
MCHC: 33.5 g/dL (ref 30.0–36.0)
MCV: 87.5 fl (ref 78.0–100.0)
Monocytes Absolute: 0.2 10*3/uL (ref 0.1–1.0)
Monocytes Relative: 2.4 % — ABNORMAL LOW (ref 3.0–12.0)
Neutro Abs: 4.8 10*3/uL (ref 1.4–7.7)
Neutrophils Relative %: 73.5 % (ref 43.0–77.0)
Platelets: 323 10*3/uL (ref 150.0–400.0)
RBC: 4.59 Mil/uL (ref 3.87–5.11)
RDW: 13.8 % (ref 11.5–14.6)
WBC: 6.5 10*3/uL (ref 4.5–10.5)

## 2013-08-04 LAB — COMPREHENSIVE METABOLIC PANEL
ALT: 15 U/L (ref 0–35)
AST: 13 U/L (ref 0–37)
Albumin: 4.2 g/dL (ref 3.5–5.2)
Alkaline Phosphatase: 81 U/L (ref 39–117)
BUN: 16 mg/dL (ref 6–23)
CO2: 28 mEq/L (ref 19–32)
Calcium: 9 mg/dL (ref 8.4–10.5)
Chloride: 105 mEq/L (ref 96–112)
Creatinine, Ser: 0.8 mg/dL (ref 0.4–1.2)
GFR: 81.05 mL/min (ref >60.00)
Glucose, Bld: 102 mg/dL — ABNORMAL HIGH (ref 70–99)
Potassium: 4.9 mEq/L (ref 3.5–5.1)
Sodium: 141 mEq/L (ref 135–145)
Total Bilirubin: 0.6 mg/dL (ref 0.3–1.2)
Total Protein: 7.3 g/dL (ref 6.0–8.3)

## 2013-08-04 LAB — LIPID PANEL
Cholesterol: 251 mg/dL — ABNORMAL HIGH (ref 0–200)
HDL: 38.3 mg/dL — ABNORMAL LOW (ref >39.00)
LDL Cholesterol: 183 mg/dL — ABNORMAL HIGH (ref 0–99)
Total CHOL/HDL Ratio: 7
Triglycerides: 151 mg/dL — ABNORMAL HIGH (ref 0.0–149.0)
VLDL: 30.2 mg/dL (ref 0.0–40.0)

## 2013-08-04 LAB — HEMOGLOBIN A1C: Hgb A1c MFr Bld: 6.2 % (ref 4.6–6.5)

## 2013-08-04 LAB — TSH: TSH: 1.53 u[IU]/mL (ref 0.35–5.50)

## 2013-08-04 NOTE — Telephone Encounter (Signed)
FYI

## 2013-08-04 NOTE — Telephone Encounter (Signed)
Patient states she had her physical about a month ago after her physical she started having leg cramps really bad. Her husband who is a doctor told her to stop using her Crestor. She states she has done that and she is waiting to hear back from her lab work. She wanted you to know that she had stopped taken them.

## 2013-08-05 ENCOUNTER — Telehealth: Payer: Self-pay | Admitting: Internal Medicine

## 2013-08-05 DIAGNOSIS — E785 Hyperlipidemia, unspecified: Secondary | ICD-10-CM

## 2013-08-05 DIAGNOSIS — Z1239 Encounter for other screening for malignant neoplasm of breast: Secondary | ICD-10-CM

## 2013-08-05 DIAGNOSIS — Z789 Other specified health status: Secondary | ICD-10-CM | POA: Insufficient documentation

## 2013-08-05 LAB — VITAMIN D 25 HYDROXY (VIT D DEFICIENCY, FRACTURES): Vit D, 25-Hydroxy: 51 ng/mL (ref 30–89)

## 2013-08-05 NOTE — Assessment & Plan Note (Signed)
Lab Results  Component Value Date   CHOL 251* 08/04/2013   CHOL 255* 07/08/2012   Lab Results  Component Value Date   HDL 38.30* 08/04/2013   HDL 36.70* 07/08/2012   Lab Results  Component Value Date   LDLCALC 183* 08/04/2013   Lab Results  Component Value Date   TRIG 151.0* 08/04/2013   TRIG 149.0 07/08/2012   Lab Results  Component Value Date   CHOLHDL 7 08/04/2013   CHOLHDL 7 07/08/2012   Lab Results  Component Value Date   LDLDIRECT 129.0 08/24/2012   LDLDIRECT 169.6 07/08/2012

## 2013-08-08 NOTE — Telephone Encounter (Signed)
Mailed unread MyChart message to pt  

## 2013-08-09 NOTE — Telephone Encounter (Signed)
Scheduling with arm has been down all morning. I will make this apt as soon as it comes back up

## 2013-08-09 NOTE — Telephone Encounter (Signed)
Tiffany Chang ,  Tiffany Chang prefers an afternoon appt for her mammmogram  Thanks.  Dr Derrel Nip

## 2013-08-09 NOTE — Addendum Note (Signed)
Addended by: Crecencio Mc on: 08/09/2013 09:46 AM   Modules accepted: Orders

## 2013-08-11 ENCOUNTER — Encounter: Payer: Self-pay | Admitting: Internal Medicine

## 2013-08-29 ENCOUNTER — Other Ambulatory Visit: Payer: Self-pay | Admitting: Internal Medicine

## 2013-08-31 ENCOUNTER — Ambulatory Visit: Payer: Self-pay | Admitting: Internal Medicine

## 2013-09-02 ENCOUNTER — Telehealth: Payer: Self-pay | Admitting: Internal Medicine

## 2013-09-02 DIAGNOSIS — M8580 Other specified disorders of bone density and structure, unspecified site: Secondary | ICD-10-CM

## 2013-09-02 NOTE — Telephone Encounter (Signed)
Recent DEXA April 2015 showed that T Scores are  -1.4 in the femur and  -2.3 in he forearm.   the prior T scores from the same machine in 2011 were not provided for comparison and after review of old records,  It appears that  they didn't even report the same femur for comparison. So what I have found is that there  was a slight decrease in femur from -1.3 to -1.4 .  I  Recommend resuming Evista if you tolerated it in the past and repeating the DEXA in 2017.

## 2013-09-02 NOTE — Assessment & Plan Note (Addendum)
Recent DEXA April 2015 showed that T Scores have improved to -1.4 in the femur but the forearm T score is much worse at -2.3 ,  the prior T scores from the same machine in 2011 were not provided for comparison and after review of chart they didn't even report the same femur for comparison. There was a slight decrease in femur from -1.3 to -1.4 .   Recommend resuming Evista if she tolerated it in the past and repeating the DEXA in 2017.

## 2013-09-27 ENCOUNTER — Encounter: Payer: Self-pay | Admitting: Internal Medicine

## 2013-09-29 ENCOUNTER — Encounter: Payer: Self-pay | Admitting: Internal Medicine

## 2014-02-27 ENCOUNTER — Other Ambulatory Visit: Payer: Self-pay | Admitting: Internal Medicine

## 2014-05-19 DIAGNOSIS — C50919 Malignant neoplasm of unspecified site of unspecified female breast: Secondary | ICD-10-CM

## 2014-05-19 HISTORY — PX: MASTECTOMY: SHX3

## 2014-05-19 HISTORY — DX: Malignant neoplasm of unspecified site of unspecified female breast: C50.919

## 2014-05-23 ENCOUNTER — Encounter: Payer: Self-pay | Admitting: Internal Medicine

## 2014-05-23 ENCOUNTER — Other Ambulatory Visit: Payer: Self-pay | Admitting: Internal Medicine

## 2014-05-23 DIAGNOSIS — F32A Depression, unspecified: Secondary | ICD-10-CM

## 2014-05-23 DIAGNOSIS — F329 Major depressive disorder, single episode, unspecified: Secondary | ICD-10-CM

## 2014-06-05 ENCOUNTER — Telehealth: Payer: Self-pay | Admitting: *Deleted

## 2014-06-05 NOTE — Telephone Encounter (Signed)
Pt sent MyChart message following up on Psy referral.  Please notify pt of status.

## 2014-06-26 ENCOUNTER — Other Ambulatory Visit: Payer: Self-pay | Admitting: Internal Medicine

## 2014-06-27 ENCOUNTER — Ambulatory Visit (INDEPENDENT_AMBULATORY_CARE_PROVIDER_SITE_OTHER): Payer: 59 | Admitting: Psychology

## 2014-06-27 DIAGNOSIS — F4321 Adjustment disorder with depressed mood: Secondary | ICD-10-CM

## 2014-07-01 ENCOUNTER — Encounter: Payer: Self-pay | Admitting: Internal Medicine

## 2014-07-03 NOTE — Telephone Encounter (Signed)
FYI

## 2014-07-10 ENCOUNTER — Encounter: Payer: 59 | Admitting: Internal Medicine

## 2014-07-11 ENCOUNTER — Ambulatory Visit (INDEPENDENT_AMBULATORY_CARE_PROVIDER_SITE_OTHER): Payer: 59 | Admitting: Psychology

## 2014-07-11 DIAGNOSIS — F4321 Adjustment disorder with depressed mood: Secondary | ICD-10-CM

## 2014-07-24 ENCOUNTER — Encounter: Payer: Self-pay | Admitting: Internal Medicine

## 2014-07-24 ENCOUNTER — Other Ambulatory Visit: Payer: Self-pay | Admitting: Internal Medicine

## 2014-07-24 ENCOUNTER — Ambulatory Visit (INDEPENDENT_AMBULATORY_CARE_PROVIDER_SITE_OTHER): Payer: 59 | Admitting: Internal Medicine

## 2014-07-24 VITALS — BP 118/76 | HR 75 | Temp 98.6°F | Resp 16 | Ht 68.75 in | Wt 161.5 lb

## 2014-07-24 DIAGNOSIS — G250 Essential tremor: Secondary | ICD-10-CM

## 2014-07-24 DIAGNOSIS — Z0000 Encounter for general adult medical examination without abnormal findings: Secondary | ICD-10-CM | POA: Diagnosis not present

## 2014-07-24 DIAGNOSIS — E559 Vitamin D deficiency, unspecified: Secondary | ICD-10-CM

## 2014-07-24 DIAGNOSIS — Z889 Allergy status to unspecified drugs, medicaments and biological substances status: Secondary | ICD-10-CM | POA: Diagnosis not present

## 2014-07-24 DIAGNOSIS — M48061 Spinal stenosis, lumbar region without neurogenic claudication: Secondary | ICD-10-CM

## 2014-07-24 DIAGNOSIS — Z789 Other specified health status: Secondary | ICD-10-CM

## 2014-07-24 DIAGNOSIS — M4806 Spinal stenosis, lumbar region: Secondary | ICD-10-CM

## 2014-07-24 DIAGNOSIS — Z8249 Family history of ischemic heart disease and other diseases of the circulatory system: Secondary | ICD-10-CM

## 2014-07-24 DIAGNOSIS — E785 Hyperlipidemia, unspecified: Secondary | ICD-10-CM

## 2014-07-24 MED ORDER — PROPRANOLOL HCL 20 MG PO TABS: 20.0000 mg | ORAL_TABLET | Freq: Two times a day (BID) | ORAL | Status: DC

## 2014-07-24 NOTE — Assessment & Plan Note (Addendum)
Managed with daily stretching and walking and avoidance of prolonged sitting.

## 2014-07-24 NOTE — Assessment & Plan Note (Signed)
Has not progressed,   Using propranolol bid.

## 2014-07-24 NOTE — Progress Notes (Signed)
Patient ID: Tiffany Chang, female   DOB: [birth year], 69 y.o.   MRN: [id number]   Annual exanm.  No new issues,  Needs thryoid checked,  Did not toelrate crestor even qod Subjective:    The patient is here for  Her annual Medicare wellness examination and management of other chronic and acute problems.   The risk factors are reflected in the social history.  The roster of all physicians providing medical care to patient - is listed in the Snapshot section of the chart.  Activities of daily living:  The patient is 100% independent in all ADLs: dressing, toileting, feeding as well as independent mobility  Home safety : The patient has smoke detectors in the home. They wear seatbelts.  There are no firearms at home. There is no violence in the home.   There is no risks for hepatitis, STDs or HIV. There is no   history of blood transfusion. They have no travel history to infectious disease endemic areas of the world.  The patient has seen their dentist in the last six month. They have seen their eye doctor in the last year. They admit to slight hearing difficulty with regard to whispered voices and some television programs.  They have deferred audiologic testing in the last year.  They do not  have excessive sun exposure. Discussed the need for sun protection: hats, long sleeves and use of sunscreen if there is significant sun exposure.   Diet: the importance of a healthy diet is discussed. They do have a healthy diet.  The benefits of regular aerobic exercise were discussed. She walks 4 times per week ,  20 minutes.   Depression screen: there are no signs or vegative symptoms of depression- irritability, change in appetite, anhedonia, sadness/tearfullness.  Cognitive assessment: the patient manages all their financial and personal affairs and is actively engaged. They could relate day,date,year and events; recalled 2/3 objects at 3 minutes; performed clock-face test normally.  The following portions  of the patient's history were reviewed and updated as appropriate: allergies, current medications, past family history, past medical history,  past surgical history, past social history  and problem list.  Visual acuity was not assessed per patient preference since she has regular follow up with her ophthalmologist. Hearing and body mass index were assessed and reviewed.   During the course of the visit the patient was educated and counseled about appropriate screening and preventive services including : fall prevention , diabetes screening, nutrition counseling, colorectal cancer screening, and recommended immunizations.    Review of Systems  Patient denies headache, fevers, malaise, unintentional weight loss, skin rash, eye pain, sinus congestion and sinus pain, sore throat, dysphagia,  hemoptysis , cough, dyspnea, wheezing, chest pain, palpitations, orthopnea, edema, abdominal pain, nausea, melena, diarrhea, constipation, flank pain, dysuria, hematuria, urinary  Frequency, nocturia, numbness, tingling, seizures,  Focal weakness, Loss of consciousness,  Tremor, insomnia, depression, anxiety, and suicidal ideation.      Objective:   BP 118/76 mmHg  Pulse 75  Temp(Src) 98.6 F (37 C) (Oral)  Resp 16  Ht 5' 8.75" (1.746 m)  Wt 161 lb 8 oz (73.256 kg)  BMI 24.03 kg/m2  SpO2 98% General appearance: alert, cooperative and appears stated age Head: Normocephalic, without obvious abnormality, atraumatic Eyes: conjunctivae/corneas clear. PERRL, EOM's intact. Fundi benign. Ears: normal TM's and external ear canals both ears Nose: Nares normal. Septum midline. Mucosa normal. No drainage or sinus tenderness. Throat: lips, mucosa, and tongue normal; teeth and gums normal  Neck: no adenopathy, no carotid bruit, no JVD, supple, symmetrical, trachea midline and thyroid not enlarged, symmetric, no tenderness/mass/nodules Lungs: clear to auscultation bilaterally Breasts: normal appearance, no masses or  tenderness Heart: regular rate and rhythm, S1, S2 normal, no murmur, click, rub or gallop Abdomen: soft, non-tender; bowel sounds normal; no masses,  no organomegaly Extremities: extremities normal, atraumatic, no cyanosis or edema Pulses: 2+ and symmetric Skin: Skin color, texture, turgor normal. No rashes or lesions Neurologic: Alert and oriented X 3, normal strength and tone. Normal symmetric reflexes. Normal coordination and gait.   .    Assessment and Plan:    Problem List Items Addressed This Visit    Vitamin D deficiency    Resolved with supplementation.       Statin intolerance - Primary    Did not tolerate qod crestor  Dicussed ryr       Spinal stenosis of lumbar region    Managed with daily stretching and walking and avoidance of prolonged sitting.       Hyperlipidemia LDL goal <160   Relevant Medications   propranolol (INDERAL) IR tablet   Hyperlipidemia    mild, with prior statin intolerance.  Retrial of crestor  caused myalgias. Recommended trial of red yeast rice but she prefers to discuss with [facility] pharmacist.       Relevant Medications   propranolol (INDERAL) IR tablet   Family history of abdominal aortic aneurysm    screening ultrasound was negative for AAA 2015.      Encounter for initial preventive physical examination covered by Medicare    Annual Medicare wellness  exam was done as well as a comprehensive physical exam and management of acute and chronic conditions .  During the course of the visit the patient was educated and counseled about appropriate screening and preventive services including : fall prevention , diabetes screening, nutrition counseling, colorectal cancer screening, and recommended immunizations.  Printed recommendations for health maintenance screenings was given.       Relevant Orders   MM DIGITAL SCREENING BILATERAL   Benign essential tremor    Has not progressed,   Using propranolol bid.

## 2014-07-24 NOTE — Patient Instructions (Signed)
Please take a probiotic ( Align, Floraque or Culturelle) for 3 weeks if you are given an antibiotic to prevent a serious antibiotic associated diarrhea  Called" clostridium dificile colitis" ( should also help prevent   vaginal yeast infection)   Health Maintenance Adopting a healthy lifestyle and getting preventive care can go a long way to promote health and wellness. Talk with your health care provider about what schedule of regular examinations is right for you. This is a good chance for you to check in with your provider about disease prevention and staying healthy. In between checkups, there are plenty of things you can do on your own. Experts have done a lot of research about which lifestyle changes and preventive measures are most likely to keep you healthy. Ask your health care provider for more information. WEIGHT AND DIET  Eat a healthy diet  Be sure to include plenty of vegetables, fruits, low-fat dairy products, and lean protein.  Do not eat a lot of foods high in solid fats, added sugars, or salt.  Get regular exercise. This is one of the most important things you can do for your health.  Most adults should exercise for at least 150 minutes each week. The exercise should increase your heart rate and make you sweat (moderate-intensity exercise).  Most adults should also do strengthening exercises at least twice a week. This is in addition to the moderate-intensity exercise.  Maintain a healthy weight  Body mass index (BMI) is a measurement that can be used to identify possible weight problems. It estimates body fat based on height and weight. Your health care provider can help determine your BMI and help you achieve or maintain a healthy weight.  For females 83 years of age and older:   A BMI below 18.5 is considered underweight.  A BMI of 18.5 to 24.9 is normal.  A BMI of 25 to 29.9 is considered overweight.  A BMI of 30 and above is considered obese.  Watch levels of  cholesterol and blood lipids  You should start having your blood tested for lipids and cholesterol at 67 years of age, then have this test every 5 years.  You may need to have your cholesterol levels checked more often if:  Your lipid or cholesterol levels are high.  You are older than 67 years of age.  You are at high risk for heart disease.  CANCER SCREENING   Lung Cancer  Lung cancer screening is recommended for adults 40-4 years old who are at high risk for lung cancer because of a history of smoking.  A yearly low-dose CT scan of the lungs is recommended for people who:  Currently smoke.  Have quit within the past 15 years.  Have at least a 30-pack-year history of smoking. A pack year is smoking an average of one pack of cigarettes a day for 1 year.  Yearly screening should continue until it has been 15 years since you quit.  Yearly screening should stop if you develop a health problem that would prevent you from having lung cancer treatment.  Breast Cancer  Practice breast self-awareness. This means understanding how your breasts normally appear and feel.  It also means doing regular breast self-exams. Let your health care provider know about any changes, no matter how small.  If you are in your 20s or 30s, you should have a clinical breast exam (CBE) by a health care provider every 1-3 years as part of a regular health exam.  If  you are 40 or older, have a CBE every year. Also consider having a breast X-ray (mammogram) every year.  If you have a family history of breast cancer, talk to your health care provider about genetic screening.  If you are at high risk for breast cancer, talk to your health care provider about having an MRI and a mammogram every year.  Breast cancer gene (BRCA) assessment is recommended for women who have family members with BRCA-related cancers. BRCA-related cancers include:  Breast.  Ovarian.  Tubal.  Peritoneal  cancers.  Results of the assessment will determine the need for genetic counseling and BRCA1 and BRCA2 testing. Cervical Cancer Routine pelvic examinations to screen for cervical cancer are no longer recommended for nonpregnant women who are considered low risk for cancer of the pelvic organs (ovaries, uterus, and vagina) and who do not have symptoms. A pelvic examination may be necessary if you have symptoms including those associated with pelvic infections. Ask your health care provider if a screening pelvic exam is right for you.   The Pap test is the screening test for cervical cancer for women who are considered at risk.  If you had a hysterectomy for a problem that was not cancer or a condition that could lead to cancer, then you no longer need Pap tests.  If you are older than 65 years, and you have had normal Pap tests for the past 10 years, you no longer need to have Pap tests.  If you have had past treatment for cervical cancer or a condition that could lead to cancer, you need Pap tests and screening for cancer for at least 20 years after your treatment.  If you no longer get a Pap test, assess your risk factors if they change (such as having a new sexual partner). This can affect whether you should start being screened again.  Some women have medical problems that increase their chance of getting cervical cancer. If this is the case for you, your health care provider may recommend more frequent screening and Pap tests.  The human papillomavirus (HPV) test is another test that may be used for cervical cancer screening. The HPV test looks for the virus that can cause cell changes in the cervix. The cells collected during the Pap test can be tested for HPV.  The HPV test can be used to screen women 30 years of age and older. Getting tested for HPV can extend the interval between normal Pap tests from three to five years.  An HPV test also should be used to screen women of any age who  have unclear Pap test results.  After 67 years of age, women should have HPV testing as often as Pap tests.  Colorectal Cancer  This type of cancer can be detected and often prevented.  Routine colorectal cancer screening usually begins at 67 years of age and continues through 67 years of age.  Your health care provider may recommend screening at an earlier age if you have risk factors for colon cancer.  Your health care provider may also recommend using home test kits to check for hidden blood in the stool.  A small camera at the end of a tube can be used to examine your colon directly (sigmoidoscopy or colonoscopy). This is done to check for the earliest forms of colorectal cancer.  Routine screening usually begins at age 50.  Direct examination of the colon should be repeated every 5-10 years through 67 years of age. However, you   may need to be screened more often if early forms of precancerous polyps or small growths are found. Skin Cancer  Check your skin from head to toe regularly.  Tell your health care provider about any new moles or changes in moles, especially if there is a change in a mole's shape or color.  Also tell your health care provider if you have a mole that is larger than the size of a pencil eraser.  Always use sunscreen. Apply sunscreen liberally and repeatedly throughout the day.  Protect yourself by wearing long sleeves, pants, a wide-brimmed hat, and sunglasses whenever you are outside. HEART DISEASE, DIABETES, AND HIGH BLOOD PRESSURE   Have your blood pressure checked at least every 1-2 years. High blood pressure causes heart disease and increases the risk of stroke.  If you are between 3 years and 36 years old, ask your health care provider if you should take aspirin to prevent strokes.  Have regular diabetes screenings. This involves taking a blood sample to check your fasting blood sugar level.  If you are at a normal weight and have a low risk for  diabetes, have this test once every three years after 67 years of age.  If you are overweight and have a high risk for diabetes, consider being tested at a younger age or more often. PREVENTING INFECTION  Hepatitis B  If you have a higher risk for hepatitis B, you should be screened for this virus. You are considered at high risk for hepatitis B if:  You were born in a country where hepatitis B is common. Ask your health care provider which countries are considered high risk.  Your parents were born in a high-risk country, and you have not been immunized against hepatitis B (hepatitis B vaccine).  You have HIV or AIDS.  You use needles to inject street drugs.  You live with someone who has hepatitis B.  You have had sex with someone who has hepatitis B.  You get hemodialysis treatment.  You take certain medicines for conditions, including cancer, organ transplantation, and autoimmune conditions. Hepatitis C  Blood testing is recommended for:  Everyone born from [birth year] through 1963.  Anyone with known risk factors for hepatitis C. Sexually transmitted infections (STIs)  You should be screened for sexually transmitted infections (STIs) including gonorrhea and chlamydia if:  You are sexually active and are younger than 67 years of age.  You are older than 67 years of age and your health care provider tells you that you are at risk for this type of infection.  Your sexual activity has changed since you were last screened and you are at an increased risk for chlamydia or gonorrhea. Ask your health care provider if you are at risk.  If you do not have HIV, but are at risk, it may be recommended that you take a prescription medicine daily to prevent HIV infection. This is called pre-exposure prophylaxis (PrEP). You are considered at risk if:  You are sexually active and do not regularly use condoms or know the HIV status of your partner(s).  You take drugs by injection.  You are  sexually active with a partner who has HIV. Talk with your health care provider about whether you are at high risk of being infected with HIV. If you choose to begin PrEP, you should first be tested for HIV. You should then be tested every 3 months for as long as you are taking PrEP.  PREGNANCY   If you  are premenopausal and you may become pregnant, ask your health care provider about preconception counseling.  If you may become pregnant, take 400 to 800 micrograms (mcg) of folic acid every day.  If you want to prevent pregnancy, talk to your health care provider about birth control (contraception). OSTEOPOROSIS AND MENOPAUSE   Osteoporosis is a disease in which the bones lose minerals and strength with aging. This can result in serious bone fractures. Your risk for osteoporosis can be identified using a bone density scan.  If you are 11 years of age or older, or if you are at risk for osteoporosis and fractures, ask your health care provider if you should be screened.  Ask your health care provider whether you should take a calcium or vitamin D supplement to lower your risk for osteoporosis.  Menopause may have certain physical symptoms and risks.  Hormone replacement therapy may reduce some of these symptoms and risks. Talk to your health care provider about whether hormone replacement therapy is right for you.  HOME CARE INSTRUCTIONS   Schedule regular health, dental, and eye exams.  Stay current with your immunizations.   Do not use any tobacco products including cigarettes, chewing tobacco, or electronic cigarettes.  If you are pregnant, do not drink alcohol.  If you are breastfeeding, limit how much and how often you drink alcohol.  Limit alcohol intake to no more than 1 drink per day for nonpregnant women. One drink equals 12 ounces of beer, 5 ounces of wine, or 1 ounces of hard liquor.  Do not use street drugs.  Do not share needles.  Ask your health care provider for  help if you need support or information about quitting drugs.  Tell your health care provider if you often feel depressed.  Tell your health care provider if you have ever been abused or do not feel safe at home. Document Released: 11/18/2010 Document Revised: 09/19/2013 Document Reviewed: 04/06/2013 [facility] Patient Information 2015 [place], [place]. This information is not intended to replace advice given to you by your health care provider. Make sure you discuss any questions you have with your health care provider.

## 2014-07-24 NOTE — Assessment & Plan Note (Signed)
Did not tolerate qod crestor  Dicussed ryr

## 2014-07-24 NOTE — Progress Notes (Signed)
Pre-visit discussion using our clinic review tool. No additional management support is needed unless otherwise documented below in the visit note.  

## 2014-07-25 ENCOUNTER — Encounter: Payer: Self-pay | Admitting: Internal Medicine

## 2014-07-25 ENCOUNTER — Telehealth: Payer: Self-pay | Admitting: *Deleted

## 2014-07-25 ENCOUNTER — Other Ambulatory Visit (INDEPENDENT_AMBULATORY_CARE_PROVIDER_SITE_OTHER): Payer: 59

## 2014-07-25 DIAGNOSIS — E785 Hyperlipidemia, unspecified: Secondary | ICD-10-CM | POA: Insufficient documentation

## 2014-07-25 DIAGNOSIS — E034 Atrophy of thyroid (acquired): Secondary | ICD-10-CM

## 2014-07-25 DIAGNOSIS — R5383 Other fatigue: Secondary | ICD-10-CM

## 2014-07-25 DIAGNOSIS — E038 Other specified hypothyroidism: Secondary | ICD-10-CM

## 2014-07-25 LAB — LIPID PANEL
Cholesterol: 244 mg/dL — ABNORMAL HIGH (ref 0–200)
HDL: 42.4 mg/dL (ref >39.00)
LDL Cholesterol: 169 mg/dL — ABNORMAL HIGH (ref 0–99)
NonHDL: 201.6
Total CHOL/HDL Ratio: 6
Triglycerides: 165 mg/dL — ABNORMAL HIGH (ref 0.0–149.0)
VLDL: 33 mg/dL (ref 0.0–40.0)

## 2014-07-25 LAB — COMPREHENSIVE METABOLIC PANEL
ALT: 11 U/L (ref 0–35)
AST: 12 U/L (ref 0–37)
Albumin: 4.2 g/dL (ref 3.5–5.2)
Alkaline Phosphatase: 81 U/L (ref 39–117)
BUN: 16 mg/dL (ref 6–23)
CO2: 34 mEq/L — ABNORMAL HIGH (ref 19–32)
Calcium: 9.7 mg/dL (ref 8.4–10.5)
Chloride: 104 mEq/L (ref 96–112)
Creatinine, Ser: 0.76 mg/dL (ref 0.40–1.20)
GFR: 80.81 mL/min (ref >60.00)
Glucose, Bld: 106 mg/dL — ABNORMAL HIGH (ref 70–99)
Potassium: 5.4 mEq/L — ABNORMAL HIGH (ref 3.5–5.1)
Sodium: 139 mEq/L (ref 135–145)
Total Bilirubin: 0.4 mg/dL (ref 0.2–1.2)
Total Protein: 7.1 g/dL (ref 6.0–8.3)

## 2014-07-25 LAB — CBC WITH DIFFERENTIAL/PLATELET
Basophils Absolute: 0 10*3/uL (ref 0.0–0.1)
Basophils Relative: 0.5 % (ref 0.0–3.0)
Eosinophils Absolute: 0.2 10*3/uL (ref 0.0–0.7)
Eosinophils Relative: 2.6 % (ref 0.0–5.0)
HCT: 41 % (ref 36.0–46.0)
Hemoglobin: 13.9 g/dL (ref 12.0–15.0)
Lymphocytes Relative: 26.4 % (ref 12.0–46.0)
Lymphs Abs: 1.6 10*3/uL (ref 0.7–4.0)
MCHC: 34 g/dL (ref 30.0–36.0)
MCV: 86.3 fl (ref 78.0–100.0)
Monocytes Absolute: 0.3 10*3/uL (ref 0.1–1.0)
Monocytes Relative: 5.1 % (ref 3.0–12.0)
Neutro Abs: 3.9 10*3/uL (ref 1.4–7.7)
Neutrophils Relative %: 65.4 % (ref 43.0–77.0)
Platelets: 349 10*3/uL (ref 150.0–400.0)
RBC: 4.74 Mil/uL (ref 3.87–5.11)
RDW: 14.2 % (ref 11.5–15.5)
WBC: 6 10*3/uL (ref 4.0–10.5)

## 2014-07-25 LAB — TSH: TSH: 1.44 u[IU]/mL (ref 0.35–4.50)

## 2014-07-25 NOTE — Telephone Encounter (Signed)
Labs and dx?  

## 2014-07-25 NOTE — Assessment & Plan Note (Signed)

## 2014-07-25 NOTE — Assessment & Plan Note (Signed)
screening ultrasound was negative for AAA 2015.

## 2014-07-25 NOTE — Assessment & Plan Note (Signed)
Resolved with supplementation 

## 2014-07-25 NOTE — Assessment & Plan Note (Signed)
mild, with prior statin intolerance.  Retrial of crestor  caused myalgias. Recommended trial of red yeast rice but she prefers to discuss with [facility] pharmacist.

## 2014-07-26 ENCOUNTER — Other Ambulatory Visit: Payer: Self-pay | Admitting: Internal Medicine

## 2014-07-26 ENCOUNTER — Encounter: Payer: Self-pay | Admitting: Internal Medicine

## 2014-07-26 DIAGNOSIS — E875 Hyperkalemia: Secondary | ICD-10-CM

## 2014-07-27 ENCOUNTER — Other Ambulatory Visit: Payer: Self-pay | Admitting: Internal Medicine

## 2014-07-27 MED ORDER — SYNTHROID 88 MCG PO TABS: 88.0000 ug | ORAL_TABLET | Freq: Every day | ORAL | Status: DC

## 2014-08-08 ENCOUNTER — Ambulatory Visit (INDEPENDENT_AMBULATORY_CARE_PROVIDER_SITE_OTHER): Payer: 59 | Admitting: Psychology

## 2014-08-08 DIAGNOSIS — F4321 Adjustment disorder with depressed mood: Secondary | ICD-10-CM

## 2014-08-09 NOTE — Telephone Encounter (Signed)
Pt had viewed unread [employer]. Lab appt scheduled for tomorrow for repeat potassium

## 2014-08-09 NOTE — Telephone Encounter (Signed)
Left message for pt to return my call.

## 2014-08-10 ENCOUNTER — Other Ambulatory Visit (INDEPENDENT_AMBULATORY_CARE_PROVIDER_SITE_OTHER): Payer: 59

## 2014-08-10 ENCOUNTER — Encounter: Payer: Self-pay | Admitting: Internal Medicine

## 2014-08-10 DIAGNOSIS — E875 Hyperkalemia: Secondary | ICD-10-CM | POA: Diagnosis not present

## 2014-08-10 LAB — BASIC METABOLIC PANEL
BUN: 23 mg/dL (ref 6–23)
CO2: 30 mEq/L (ref 19–32)
Calcium: 9.6 mg/dL (ref 8.4–10.5)
Chloride: 103 mEq/L (ref 96–112)
Creatinine, Ser: 0.81 mg/dL (ref 0.40–1.20)
GFR: 75.07 mL/min (ref >60.00)
Glucose, Bld: 116 mg/dL — ABNORMAL HIGH (ref 70–99)
Potassium: 4.6 mEq/L (ref 3.5–5.1)
Sodium: 137 mEq/L (ref 135–145)

## 2014-08-11 ENCOUNTER — Encounter: Payer: Self-pay | Admitting: Internal Medicine

## 2014-08-25 NOTE — Telephone Encounter (Signed)
Mailed Patient a copy of her unread My Chart Message on 08/25/2014.

## 2014-09-04 ENCOUNTER — Ambulatory Visit
Admit: 2014-09-04 | Disposition: A | Discharge status: Home / Self Care | Payer: Self-pay | Attending: Internal Medicine | Admitting: Internal Medicine

## 2014-09-06 ENCOUNTER — Telehealth: Payer: Self-pay | Admitting: Internal Medicine

## 2014-09-06 NOTE — Telephone Encounter (Signed)
Tiffany Chang,  Your  mammogram was reported to me as abnormal on the right breast, due to possible distortion.  It was read byThomas Purcell Nails Louie Casa will want to know ) [facility] will be contacting you for additional images and possibly an ultrasound.  If you don't hear from them by tomorrow,  Let me know.

## 2014-09-06 NOTE — Telephone Encounter (Signed)
Patient notified and voiced understanding and will advise if has not heard from [place] by 09/07/14.

## 2014-09-20 ENCOUNTER — Other Ambulatory Visit: Payer: Self-pay | Admitting: Internal Medicine

## 2014-09-20 ENCOUNTER — Ambulatory Visit
Admission: RE | Admit: 2014-09-20 | Discharge: 2014-09-20 | Disposition: A | Discharge status: Home / Self Care | Payer: 59 | Source: Ambulatory Visit | Attending: Internal Medicine | Admitting: Internal Medicine

## 2014-09-20 ENCOUNTER — Telehealth: Payer: Self-pay | Admitting: Internal Medicine

## 2014-09-20 DIAGNOSIS — R928 Other abnormal and inconclusive findings on diagnostic imaging of breast: Secondary | ICD-10-CM

## 2014-09-20 DIAGNOSIS — C50911 Malignant neoplasm of unspecified site of right female breast: Secondary | ICD-10-CM

## 2014-09-20 NOTE — Telephone Encounter (Signed)
[facility] [place] called needing orders signed for Biopsy and [place]. Patient was also notified of date and time of biopsy today and verified with patient directions to facility.

## 2014-09-20 NOTE — Telephone Encounter (Signed)
I have printed the order ,  signed it  AND placed it on your keyboard

## 2014-09-21 ENCOUNTER — Other Ambulatory Visit: Payer: Self-pay | Admitting: Internal Medicine

## 2014-09-21 NOTE — Telephone Encounter (Signed)
Faxed orders to [facility] imaging Breast center attention Juliann Pulse to [phone].

## 2014-09-22 ENCOUNTER — Telehealth: Payer: Self-pay

## 2014-09-22 NOTE — Telephone Encounter (Signed)
Patient is aware of appointment °

## 2014-09-22 NOTE — Telephone Encounter (Signed)
Cathy at the breast center of [facility] called and was hoping to speak with Juliann Pulse regarding this pt's surgery  Callback - [phone] ext [phone]

## 2014-09-25 ENCOUNTER — Ambulatory Visit
Admission: RE | Admit: 2014-09-25 | Discharge: 2014-09-25 | Disposition: A | Discharge status: Home / Self Care | Payer: 59 | Source: Ambulatory Visit | Attending: Internal Medicine | Admitting: Internal Medicine

## 2014-09-25 DIAGNOSIS — N6489 Other specified disorders of breast: Secondary | ICD-10-CM | POA: Diagnosis not present

## 2014-09-25 DIAGNOSIS — D0591 Unspecified type of carcinoma in situ of right breast: Secondary | ICD-10-CM | POA: Diagnosis not present

## 2014-09-25 DIAGNOSIS — C50411 Malignant neoplasm of upper-outer quadrant of right female breast: Secondary | ICD-10-CM | POA: Diagnosis not present

## 2014-09-25 DIAGNOSIS — R928 Other abnormal and inconclusive findings on diagnostic imaging of breast: Secondary | ICD-10-CM | POA: Diagnosis not present

## 2014-09-26 ENCOUNTER — Other Ambulatory Visit: Payer: Self-pay | Admitting: Internal Medicine

## 2014-09-26 ENCOUNTER — Encounter: Payer: Self-pay | Admitting: Internal Medicine

## 2014-09-26 DIAGNOSIS — C50911 Malignant neoplasm of unspecified site of right female breast: Secondary | ICD-10-CM

## 2014-09-27 ENCOUNTER — Telehealth: Payer: Self-pay

## 2014-09-27 ENCOUNTER — Telehealth: Payer: Self-pay | Admitting: *Deleted

## 2014-09-27 DIAGNOSIS — C50911 Malignant neoplasm of unspecified site of right female breast: Secondary | ICD-10-CM

## 2014-09-27 NOTE — Telephone Encounter (Signed)
Order for referral printed and signed.  Does Tiffany Chang need to do anything with it?

## 2014-09-27 NOTE — Telephone Encounter (Signed)
Ann from the [facility] called requesting a referral to Dr Grayland Ormond, pt has appoint already scheduled for 5.17.16 at 10 am.  Please fax referral to [phone].

## 2014-09-27 NOTE — Telephone Encounter (Signed)
Received notification from Dr. Pat Patrick that his wife diagnosed with breast cancer, and wanted navigation. Phoned patient to introduce Navigation service.   Scheduled her to see Dr. Grayland Ormond on 10/03/14 at 10:00.  Phoned Dr. Lupita Dawn office for referral.  [facility] Breast Cancer Treatment Handbook/folder with hospital service to Dr. Pat Patrick to give to patient.  Will meet with her next week in Breast Clinic.

## 2014-09-28 ENCOUNTER — Encounter: Payer: Self-pay | Admitting: Internal Medicine

## 2014-09-29 ENCOUNTER — Telehealth: Payer: Self-pay

## 2014-09-29 NOTE — Telephone Encounter (Signed)
Oncology Nurse Navigator Documentation  Oncology Nurse Navigator Flowsheets 09/29/2014  Navigator Encounter Type Telephone  Patient Visit Type Follow-up  Treatment Phase Other  Barriers/Navigation Needs No barriers at this time  Interventions Coordination of Care  Coordination of Care MD Appointments  Time Spent with Patient 15    Phoned patient to review risk factors for Case Conference presentation.  At patients request put her in contact with Carlena Hurl, Survivorship Outreach coordinator.

## 2014-10-03 ENCOUNTER — Other Ambulatory Visit: Payer: Self-pay | Admitting: Surgery

## 2014-10-03 ENCOUNTER — Encounter
Admission: RE | Admit: 2014-10-03 | Discharge: 2014-10-03 | Disposition: A | Discharge status: Home / Self Care | Payer: 59 | Source: Ambulatory Visit | Attending: Surgery | Admitting: Surgery

## 2014-10-03 ENCOUNTER — Inpatient Hospital Stay: Payer: 59 | Attending: Oncology | Admitting: Oncology

## 2014-10-03 ENCOUNTER — Ambulatory Visit: Payer: Medicare Other

## 2014-10-03 VITALS — BP 143/85 | HR 74 | Temp 98.4°F | Resp 18 | Ht 67.72 in | Wt 158.1 lb

## 2014-10-03 DIAGNOSIS — C50911 Malignant neoplasm of unspecified site of right female breast: Secondary | ICD-10-CM

## 2014-10-03 DIAGNOSIS — M818 Other osteoporosis without current pathological fracture: Secondary | ICD-10-CM | POA: Insufficient documentation

## 2014-10-03 DIAGNOSIS — Z170 Estrogen receptor positive status [ER+]: Secondary | ICD-10-CM | POA: Diagnosis not present

## 2014-10-03 DIAGNOSIS — Z01810 Encounter for preprocedural cardiovascular examination: Secondary | ICD-10-CM | POA: Diagnosis not present

## 2014-10-03 DIAGNOSIS — C50411 Malignant neoplasm of upper-outer quadrant of right female breast: Secondary | ICD-10-CM | POA: Insufficient documentation

## 2014-10-03 DIAGNOSIS — E039 Hypothyroidism, unspecified: Secondary | ICD-10-CM | POA: Diagnosis not present

## 2014-10-03 DIAGNOSIS — Z79899 Other long term (current) drug therapy: Secondary | ICD-10-CM | POA: Diagnosis not present

## 2014-10-03 DIAGNOSIS — Z01812 Encounter for preprocedural laboratory examination: Secondary | ICD-10-CM | POA: Insufficient documentation

## 2014-10-03 LAB — BASIC METABOLIC PANEL
Anion gap: 7 (ref 5–15)
BUN: 22 mg/dL — ABNORMAL HIGH (ref 6–20)
CO2: 27 mmol/L (ref 22–32)
Calcium: 8.8 mg/dL — ABNORMAL LOW (ref 8.9–10.3)
Chloride: 106 mmol/L (ref 101–111)
Creatinine, Ser: 0.66 mg/dL (ref 0.44–1.00)
GFR calc Af Amer: >60 mL/min (ref >60)
GFR calc non Af Amer: >60 mL/min (ref >60)
Glucose, Bld: 106 mg/dL — ABNORMAL HIGH (ref 65–99)
Potassium: 4 mmol/L (ref 3.5–5.1)
Sodium: 140 mmol/L (ref 135–145)

## 2014-10-03 LAB — DIFFERENTIAL
Basophils Absolute: 0 10*3/uL (ref 0–0.1)
Basophils Relative: 1 %
Eosinophils Absolute: 0.2 10*3/uL (ref 0–0.7)
Eosinophils Relative: 2 %
Lymphocytes Relative: 21 %
Lymphs Abs: 1.6 10*3/uL (ref 1.0–3.6)
Monocytes Absolute: 0.4 10*3/uL (ref 0.2–0.9)
Monocytes Relative: 6 %
Neutro Abs: 5.3 10*3/uL (ref 1.4–6.5)
Neutrophils Relative %: 70 %

## 2014-10-03 LAB — CBC
HCT: 40.7 % (ref 35.0–47.0)
Hemoglobin: 13.4 g/dL (ref 12.0–16.0)
MCH: 29.2 pg (ref 26.0–34.0)
MCHC: 32.8 g/dL (ref 32.0–36.0)
MCV: 88.9 fL (ref 80.0–100.0)
Platelets: 309 10*3/uL (ref 150–440)
RBC: 4.58 MIL/uL (ref 3.80–5.20)
RDW: 13.6 % (ref 11.5–14.5)
WBC: 7.5 10*3/uL (ref 3.6–11.0)

## 2014-10-03 LAB — SLIDE CONSULT, PATHOLOGY ARMC

## 2014-10-03 NOTE — Progress Notes (Signed)
Oncology Nurse Navigator Documentation  Oncology Nurse Navigator Flowsheets 09/29/2014 10/03/2014  Navigator Encounter Type Telephone Initial MedOnc  Patient Visit Type Follow-up Medonc  Treatment Phase Other -  Barriers/Navigation Needs No barriers at this time -  Interventions Coordination of Care Coordination of Care  Coordination of Care MD Appointments MD Appointments  Education Method - Teach-back  Support Groups/Services - Breast  Time Spent with Patient 15 60   Met with patient at initial [facility] visit.  Coordinated Pre-admission testing for today.  Patient is scheduled for mastectomy 10/10/14.  Spoke to Julianne Handler RN at Dr. Thompson Caul office. Put her in contact with Sarah in Nuclear Medicine to schedule Sentinel node biopsy .

## 2014-10-03 NOTE — Patient Instructions (Signed)
  Your procedure is scheduled on: Report to 5/24/16Day Surgery. To find out your arrival time please call [phone] between 1PM - 3PM on 10/09/14  Remember: Instructions that are not followed completely may result in serious medical risk, up to and including death, or upon the discretion of your surgeon and anesthesiologist your surgery may need to be rescheduled.    _x___ 1. Do not eat food or drink liquids after midnight. No gum chewing or hard candies.     __x__ 2. No Alcohol for 24 hours before or after surgery.   ____ 3. Bring all medications with you on the day of surgery if instructed.    __x__ 4. Notify your doctor if there is any change in your medical condition     (cold, fever, infections).     Do not wear jewelry, make-up, hairpins, clips or nail polish.  Do not wear lotions, powders, or perfumes. You may wear deodorant.  Do not shave 48 hours prior to surgery. Men may shave face and neck.  Do not bring valuables to the hospital.    [facility] is not responsible for any belongings or valuables.               Contacts, dentures or bridgework may not be worn into surgery.  Leave your suitcase in the car. After surgery it may be brought to your room.  For patients admitted to the hospital, discharge time is determined by your                treatment team.   Patients discharged the day of surgery will not be allowed to drive home.   Please read over the following fact sheets that you were given:    ____ Take these medicines the morning of surgery with A SIP OF WATER:    1. Synthroid  2. Prropranolol  3.   4.  5.  6.  ____ Fleet Enema (as directed)   _x___ Use CHG Soap as directed  ____ Use inhalers on the day of surgery  ____ Stop metformin 2 days prior to surgery    ____ Take 1/2 of usual insulin dose the night before surgery and none on the morning of surgery.   _x___ Stop Coumadin/Plavix/aspirin on: stop Aspirin 10/03/14  ____ Stop Anti-inflammatories  on    ____ Stop supplements until after surgery.    ____ Bring C-Pap to the hospital.

## 2014-10-06 ENCOUNTER — Other Ambulatory Visit: Payer: Medicare Other

## 2014-10-10 ENCOUNTER — Ambulatory Visit: Payer: 59 | Admitting: Certified Registered Nurse Anesthetist

## 2014-10-10 ENCOUNTER — Observation Stay
Admission: RE | Admit: 2014-10-10 | Discharge: 2014-10-11 | Disposition: A | Discharge status: Home / Self Care | Payer: 59 | Source: Ambulatory Visit | Attending: Surgery | Admitting: Surgery

## 2014-10-10 ENCOUNTER — Encounter
Admission: RE | Admit: 2014-10-10 | Discharge: 2014-10-10 | Disposition: A | Discharge status: Home / Self Care | Payer: 59 | Source: Ambulatory Visit | Attending: Surgery | Admitting: Surgery

## 2014-10-10 ENCOUNTER — Encounter
Admission: RE | Disposition: A | Discharge status: Home / Self Care | Payer: Self-pay | Source: Ambulatory Visit | Attending: Surgery

## 2014-10-10 DIAGNOSIS — C50911 Malignant neoplasm of unspecified site of right female breast: Secondary | ICD-10-CM | POA: Diagnosis not present

## 2014-10-10 DIAGNOSIS — Z7982 Long term (current) use of aspirin: Secondary | ICD-10-CM | POA: Insufficient documentation

## 2014-10-10 DIAGNOSIS — Z853 Personal history of malignant neoplasm of breast: Secondary | ICD-10-CM | POA: Diagnosis not present

## 2014-10-10 DIAGNOSIS — Z803 Family history of malignant neoplasm of breast: Secondary | ICD-10-CM | POA: Diagnosis not present

## 2014-10-10 DIAGNOSIS — E559 Vitamin D deficiency, unspecified: Secondary | ICD-10-CM | POA: Insufficient documentation

## 2014-10-10 DIAGNOSIS — Z8249 Family history of ischemic heart disease and other diseases of the circulatory system: Secondary | ICD-10-CM | POA: Insufficient documentation

## 2014-10-10 DIAGNOSIS — D0501 Lobular carcinoma in situ of right breast: Principal | ICD-10-CM | POA: Insufficient documentation

## 2014-10-10 DIAGNOSIS — C50411 Malignant neoplasm of upper-outer quadrant of right female breast: Secondary | ICD-10-CM | POA: Diagnosis present

## 2014-10-10 DIAGNOSIS — Z91048 Other nonmedicinal substance allergy status: Secondary | ICD-10-CM | POA: Insufficient documentation

## 2014-10-10 DIAGNOSIS — Z79899 Other long term (current) drug therapy: Secondary | ICD-10-CM | POA: Diagnosis not present

## 2014-10-10 DIAGNOSIS — E079 Disorder of thyroid, unspecified: Secondary | ICD-10-CM | POA: Insufficient documentation

## 2014-10-10 DIAGNOSIS — L821 Other seborrheic keratosis: Secondary | ICD-10-CM | POA: Diagnosis not present

## 2014-10-10 HISTORY — PX: SIMPLE MASTECTOMY WITH AXILLARY SENTINEL NODE BIOPSY: SHX6098

## 2014-10-10 HISTORY — PX: SENTINEL NODE BIOPSY: SHX6608

## 2014-10-10 SURGERY — SIMPLE MASTECTOMY
Anesthesia: General | Laterality: Right

## 2014-10-10 MED ORDER — ONDANSETRON HCL 4 MG/2ML IJ SOLN
4.0000 mg | Freq: Four times a day (QID) | INTRAMUSCULAR | Status: DC | PRN
  Administered 2014-10-11: 4 mg via INTRAMUSCULAR
  Filled 2014-10-10: qty 2

## 2014-10-10 MED ORDER — DEXAMETHASONE SODIUM PHOSPHATE 4 MG/ML IJ SOLN
INTRAMUSCULAR | Status: DC | PRN
  Administered 2014-10-10: 10 mg via INTRAVENOUS

## 2014-10-10 MED ORDER — HYDROCODONE-ACETAMINOPHEN 5-325 MG PO TABS
1.0000 | ORAL_TABLET | ORAL | Status: DC | PRN
Start: 2014-10-10 — End: 2014-10-11
  Administered 2014-10-10 – 2014-10-11 (×3): 1 via ORAL
  Filled 2014-10-10: qty 2
  Filled 2014-10-10 (×2): qty 1

## 2014-10-10 MED ORDER — FENTANYL CITRATE (PF) 100 MCG/2ML IJ SOLN
25.0000 ug | INTRAMUSCULAR | Status: DC | PRN
  Administered 2014-10-10 (×4): 25 ug via INTRAVENOUS

## 2014-10-10 MED ORDER — LIDOCAINE HCL (CARDIAC) 20 MG/ML IV SOLN
INTRAVENOUS | Status: DC | PRN
  Administered 2014-10-10: 80 mg via INTRAVENOUS

## 2014-10-10 MED ORDER — ONDANSETRON HCL 4 MG/2ML IJ SOLN: 4.0000 mg | Freq: Once | INTRAMUSCULAR | Status: DC | PRN

## 2014-10-10 MED ORDER — FAMOTIDINE 20 MG PO TABS
20.0000 mg | ORAL_TABLET | Freq: Once | ORAL | Status: AC
  Administered 2014-10-10: 20 mg via ORAL

## 2014-10-10 MED ORDER — DEXTROSE-NACL 5-0.2 % IV SOLN
INTRAVENOUS | Status: DC
Start: 2014-10-10 — End: 2014-10-11
  Administered 2014-10-10: 20:00:00 via INTRAVENOUS

## 2014-10-10 MED ORDER — LACTATED RINGERS IV SOLN
INTRAVENOUS | Status: DC
  Administered 2014-10-10 (×3): via INTRAVENOUS

## 2014-10-10 MED ORDER — PROPOFOL 10 MG/ML IV BOLUS
INTRAVENOUS | Status: DC | PRN
  Administered 2014-10-10: 150 mg via INTRAVENOUS

## 2014-10-10 MED ORDER — PROPRANOLOL HCL 10 MG PO TABS
20.0000 mg | ORAL_TABLET | Freq: Two times a day (BID) | ORAL | Status: DC
  Administered 2014-10-10 – 2014-10-11 (×2): 20 mg via ORAL
  Filled 2014-10-10 (×2): qty 2

## 2014-10-10 MED ORDER — GLYCOPYRROLATE 0.2 MG/ML IJ SOLN
INTRAMUSCULAR | Status: DC | PRN
  Administered 2014-10-10: 0.2 mg via INTRAVENOUS

## 2014-10-10 MED ORDER — ACETAMINOPHEN 325 MG PO TABS: 650.0000 mg | ORAL_TABLET | ORAL | Status: DC | PRN

## 2014-10-10 MED ORDER — ONDANSETRON HCL 4 MG/2ML IJ SOLN
INTRAMUSCULAR | Status: DC | PRN
  Administered 2014-10-10: 4 mg via INTRAVENOUS

## 2014-10-10 MED ORDER — SODIUM CHLORIDE 0.9 % IR SOLN
Status: DC | PRN
  Administered 2014-10-10: 700 mL

## 2014-10-10 MED ORDER — TECHNETIUM TC 99M SULFUR COLLOID
1.0480 | Freq: Once | INTRAVENOUS | Status: AC | PRN
  Administered 2014-10-10: 1.048 via INTRAVENOUS

## 2014-10-10 MED ORDER — LEVOTHYROXINE SODIUM 88 MCG PO TABS
88.0000 ug | ORAL_TABLET | Freq: Every day | ORAL | Status: DC
  Administered 2014-10-11: 88 ug via ORAL
  Filled 2014-10-10 (×2): qty 1

## 2014-10-10 MED ORDER — MORPHINE SULFATE 2 MG/ML IJ SOLN
2.0000 mg | INTRAMUSCULAR | Status: DC | PRN
  Administered 2014-10-10: 2 mg via INTRAVENOUS
  Filled 2014-10-10: qty 1

## 2014-10-10 MED ORDER — PHENYLEPHRINE HCL 10 MG/ML IJ SOLN
INTRAMUSCULAR | Status: DC | PRN
  Administered 2014-10-10 (×4): 100 ug via INTRAVENOUS

## 2014-10-10 MED ORDER — FENTANYL CITRATE (PF) 100 MCG/2ML IJ SOLN
INTRAMUSCULAR | Status: AC
  Administered 2014-10-10: 25 ug via INTRAVENOUS
  Filled 2014-10-10: qty 2

## 2014-10-10 MED ORDER — FAMOTIDINE 20 MG PO TABS
ORAL_TABLET | ORAL | Status: AC
  Administered 2014-10-10: 20 mg via ORAL
  Filled 2014-10-10: qty 1

## 2014-10-10 MED ORDER — ACETAMINOPHEN 325 MG PO TABS: 650.0000 mg | ORAL_TABLET | Freq: Four times a day (QID) | ORAL | Status: DC | PRN

## 2014-10-10 MED ORDER — HYDROCODONE-ACETAMINOPHEN 5-325 MG PO TABS
1.0000 | ORAL_TABLET | ORAL | Status: DC | PRN
Start: 2014-10-10 — End: 2014-10-10

## 2014-10-10 MED ORDER — FENTANYL CITRATE (PF) 100 MCG/2ML IJ SOLN
INTRAMUSCULAR | Status: DC | PRN
  Administered 2014-10-10 (×2): 25 ug via INTRAVENOUS
  Administered 2014-10-10 (×2): 50 ug via INTRAVENOUS
  Administered 2014-10-10 (×4): 25 ug via INTRAVENOUS

## 2014-10-10 MED ORDER — MIDAZOLAM HCL 2 MG/2ML IJ SOLN
INTRAMUSCULAR | Status: DC | PRN
  Administered 2014-10-10: 2 mg via INTRAVENOUS

## 2014-10-10 SURGICAL SUPPLY — 36 items
BULB RESERV EVAC DRAIN JP 100C (MISCELLANEOUS) ×4 IMPLANT
CANISTER SUCT 1200ML W/VALVE (MISCELLANEOUS) ×2 IMPLANT
CHLORAPREP W/TINT 26ML (MISCELLANEOUS) ×2 IMPLANT
DERMABOND ADVANCED (GAUZE/BANDAGES/DRESSINGS) ×1
DERMABOND ADVANCED .7 DNX12 (GAUZE/BANDAGES/DRESSINGS) ×1 IMPLANT
DRAIN CHANNEL JP 15F RND 16 (MISCELLANEOUS) ×4 IMPLANT
DRAPE LAPAROTOMY TRNSV 106X77 (MISCELLANEOUS) ×2 IMPLANT
GAUZE SPONGE 4X4 12PLY STRL (GAUZE/BANDAGES/DRESSINGS) ×2 IMPLANT
GLOVE BIO SURGEON STRL SZ7.5 (GLOVE) ×4 IMPLANT
GOWN STRL REUS W/ TWL LRG LVL3 (GOWN DISPOSABLE) ×3 IMPLANT
GOWN STRL REUS W/TWL LRG LVL3 (GOWN DISPOSABLE) ×3
KIT RM TURNOVER STRD PROC AR (KITS) ×2 IMPLANT
LABEL OR SOLS (LABEL) ×2 IMPLANT
LIQUID BAND (GAUZE/BANDAGES/DRESSINGS) ×2 IMPLANT
NDL SAFETY 18GX1.5 (NEEDLE) IMPLANT
NDL SAFETY 22GX1.5 (NEEDLE) ×2 IMPLANT
PACK BASIN MINOR ARMC (MISCELLANEOUS) ×2 IMPLANT
PAD GROUND ADULT SPLIT (MISCELLANEOUS) ×2 IMPLANT
SLEVE PROBE SENORX GAMMA FIND (MISCELLANEOUS) ×2 IMPLANT
SPONGE LAP 18X18 5 PK (GAUZE/BANDAGES/DRESSINGS) ×4 IMPLANT
SUT CHROMIC 3 0 SH 27 (SUTURE) ×2 IMPLANT
SUT CHROMIC 4 0 RB 1X27 (SUTURE) ×4 IMPLANT
SUT ETHILON 3-0 FS-10 30 BLK (SUTURE) ×2
SUT ETHILON 4-0 (SUTURE) ×5
SUT ETHILON 4-0 FS2 18XMFL BLK (SUTURE) ×5
SUT MNCRL 4-0 (SUTURE) ×2
SUT MNCRL 4-0 27XMFL (SUTURE) ×2
SUT SILK 3-0 (SUTURE) ×2
SUT SILK 3-0 SH-1 18XCR BRD (SUTURE) ×2
SUT VICRYL+ 3-0 144IN (SUTURE) ×2 IMPLANT
SUTURE EHLN 3-0 FS-10 30 BLK (SUTURE) ×1 IMPLANT
SUTURE ETHLN 4-0 FS2 18XMF BLK (SUTURE) ×5 IMPLANT
SUTURE MNCRL 4-0 27XMF (SUTURE) ×2 IMPLANT
SUTURE SILK 3-0 SH-1 18XCR BRD (SUTURE) ×2 IMPLANT
SYRINGE 10CC LL (SYRINGE) ×2 IMPLANT
WATER STERILE IRR 1000ML POUR (IV SOLUTION) ×2 IMPLANT

## 2014-10-10 NOTE — Transfer of Care (Signed)
Immediate Anesthesia Transfer of Care Note  Patient: Tiffany Chang  Procedure(s) Performed: Procedure(s): SIMPLE MASTECTOMY (Right) SENTINEL NODE BIOPSY (Right)  Patient Location: PACU  Anesthesia Type:General  Level of Consciousness: sedated  Airway & Oxygen Therapy: Patient Spontanous Breathing and Patient connected to face mask oxygen  Post-op Assessment: Report given to RN  Post vital signs: Reviewed and stable  Last Vitals:  Filed Vitals:   10/10/14 1739  BP: 113/65  Pulse: 90  Temp: 37.3 C  Resp: 16    Complications: No apparent anesthesia complications

## 2014-10-10 NOTE — Op Note (Signed)
OPERATIVE REPORT  PREOPERATIVE  DIAGNOSIS: . Carcinoma of the right breast  POSTOPERATIVE DIAGNOSIS: . Carcinoma of the right breast  PROCEDURE: . Right mastectomy with sentinel lymph node biopsy  ANESTHESIA:  General  SURGEON: Rochel Brome  MD   INDICATIONS: . She had recent mammogram depicting a density in the upper outer quadrant of the left breast. 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  With the patient on the operating table in the supine position under general anesthesia,  the right arm was placed on a lateral arm support. The right breast was prepared with Chloaprep and  draped in a sterile manner. Oblique incisions were made from medial to lateral above and below the breast and carried down into the subcutaneous tissues using electrocautery for hemostasis. Silk sutures were placed at the skin edges for traction. Skin and subcutaneous flaps were raised in the direction of the clavicle also medially towards the sternum, inferiorly to the inframammary fold and lateraly to the latissimus dorsi muscle. Further dissection was carried out in the axilla separating a portion of the axillary fat pad from the pectoralis major muscle. The gamma counter was used to demonstrate the location of radioactivity in the inferior aspect of the axilla adjacent to the rib cage. A lymph node was dissected free from surrounding structures with some surrounding fatty tissue. This lymph node was approximately 8 mm in dimension and was soft. The ex vivo counts per second was in the range of 90-120. This was submitted as sentinel lymph node for frozen section. The background count was less than 8 counts per second. There was no remaining palpable mass within the axilla. Next the breast was elevated off the underlying deep fascia and muscle with electrocautery dissecting from medial to lateral.  The pathologist called to report no cancer was seen in the sentinel lymph node. Further dissection  was carried out removing the axillary tail of the breast in continuity with the breast. The lateral end of the skin ellipse was tagged with a 3-0 nylon stitch and the breast was submitted for routine pathology. It is noted that during the course of the procedure a number of bleeding points were suture ligated with 3-0 chromic. The wound was inspected and determined hemostasis was intact. The wound was irrigated with warm saline solution. The estimated blood loss for the procedure was approximately 50 cc. 2 Blake drains were inserted through separate inferior stab wounds. One was placed in the axilla and the other along the medial chest wall. The drains were sutured to the skin with 3-0 nylon. The wound was closed with a running 4-0 Monocryl subcuticular suture and LiquiBand. Gauze dressings were placed around the drain site and secured with benzoin and 2 inch silk tape. The drains were activated and there was a minimal serosanguineous drainage. The patient was prepared for transfer to the recovery room in satisfactory condition.  Rochel Brome M.D.

## 2014-10-10 NOTE — Anesthesia Postprocedure Evaluation (Signed)
  Anesthesia Post-op Note  Patient: Tiffany Chang  Procedure(s) Performed: Procedure(s): SIMPLE MASTECTOMY (Right) SENTINEL NODE BIOPSY (Right)  Anesthesia type:General LMA  Patient location: PACU  Post pain: Pain level controlled  Post assessment: Post-op Vital signs reviewed, Patient's Cardiovascular Status Stable, Respiratory Function Stable, Patent Airway and No signs of Nausea or vomiting  Post vital signs: Reviewed and stable  Last Vitals:  Filed Vitals:   10/10/14 1810  BP: 115/80  Pulse: 91  Temp:   Resp: 14    Level of consciousness: awake, alert  and patient cooperative  Complications: No apparent anesthesia complications

## 2014-10-10 NOTE — Anesthesia Procedure Notes (Signed)
Procedure Name: LMA Insertion Date/Time: 10/10/2014 2:56 PM Performed by: Silvana Newness Pre-anesthesia Checklist: Patient identified, Emergency Drugs available, Suction available, Patient being monitored and Timeout performed Patient Re-evaluated:Patient Re-evaluated prior to inductionOxygen Delivery Method: Circle system utilized Preoxygenation: Pre-oxygenation with 100% oxygen Intubation Type: IV induction Ventilation: Mask ventilation without difficulty LMA: LMA inserted LMA Size: 3.5 Number of attempts: 1 Placement Confirmation: positive ETCO2 and breath sounds checked- equal and bilateral Tube secured with: Tape Dental Injury: Teeth and Oropharynx as per pre-operative assessment

## 2014-10-10 NOTE — Anesthesia Preprocedure Evaluation (Signed)
Anesthesia Evaluation  Patient identified by MRN, date of birth, ID band Patient awake    Reviewed: Allergy & Precautions, H&P , NPO status , Patient's Chart, lab work & pertinent test results, reviewed documented beta blocker date and time   Airway Mallampati: II  TM Distance: >3 FB Neck ROM: full    Dental   Pulmonary Current Smoker,          Cardiovascular Rate:Normal     Neuro/Psych    GI/Hepatic   Endo/Other    Renal/GU      Musculoskeletal   Abdominal   Peds  Hematology   Anesthesia Other Findings   Reproductive/Obstetrics                             Anesthesia Physical Anesthesia Plan  ASA: II  Anesthesia Plan: General LMA   Post-op Pain Management:    Induction:   Airway Management Planned:   Additional Equipment:   Intra-op Plan:   Post-operative Plan:   Informed Consent: I have reviewed the patients History and Physical, chart, labs and discussed the procedure including the risks, benefits and alternatives for the proposed anesthesia with the patient or authorized representative who has indicated his/her understanding and acceptance.     Plan Discussed with: CRNA  Anesthesia Plan Comments:         Anesthesia Quick Evaluation

## 2014-10-10 NOTE — H&P (Signed)
  After discussion in the office she elected to proceed with a mastectomy with sentinel lymph node biopsy possible axillary lymph node dissection.  She reports no change in overall condition since the day of the office visit  Rochel Brome M.D. 10/10/2014

## 2014-10-11 ENCOUNTER — Encounter: Payer: Self-pay | Admitting: Nurse Practitioner

## 2014-10-11 DIAGNOSIS — E559 Vitamin D deficiency, unspecified: Secondary | ICD-10-CM | POA: Diagnosis not present

## 2014-10-11 DIAGNOSIS — E079 Disorder of thyroid, unspecified: Secondary | ICD-10-CM | POA: Diagnosis not present

## 2014-10-11 DIAGNOSIS — Z7982 Long term (current) use of aspirin: Secondary | ICD-10-CM | POA: Diagnosis not present

## 2014-10-11 DIAGNOSIS — Z853 Personal history of malignant neoplasm of breast: Secondary | ICD-10-CM | POA: Diagnosis not present

## 2014-10-11 DIAGNOSIS — L821 Other seborrheic keratosis: Secondary | ICD-10-CM | POA: Diagnosis not present

## 2014-10-11 DIAGNOSIS — D0501 Lobular carcinoma in situ of right breast: Secondary | ICD-10-CM | POA: Diagnosis not present

## 2014-10-11 MED ORDER — HYDROCODONE-ACETAMINOPHEN 5-325 MG PO TABS: 1.0000 | ORAL_TABLET | ORAL | Status: DC | PRN

## 2014-10-11 MED ORDER — ONDANSETRON HCL 4 MG/2ML IJ SOLN
4.0000 mg | Freq: Four times a day (QID) | INTRAMUSCULAR | Status: DC | PRN
Start: 2014-10-11 — End: 2014-10-11

## 2014-10-11 NOTE — Progress Notes (Signed)
Instructions given to pt. And spouse with all questions answered. Prescriptions given and IV removed. Pt. Taken out in wheelchair by Set designer.

## 2014-10-13 LAB — SURGICAL PATHOLOGY

## 2014-10-13 NOTE — Progress Notes (Addendum)
Patient states she is doing well. "Healing".  Reminded of navigation support.   Oncology Nurse Navigator Documentation  Oncology Nurse Navigator Flowsheets 09/29/2014 10/03/2014 10/13/2014  Navigator Encounter Type Telephone Initial MedOnc Telephone  Patient Visit Type Follow-up Medonc Medonc  Treatment Phase Other - Other  Barriers/Navigation Needs No barriers at this time - No barriers at this time  Interventions Coordination of Care Coordination of Care Talk to a survivor  Coordination of Care MD Appointments MD Appointments -  Education Method - Teach-back -  Support Groups/Services - Breast -  Time Spent with Patient 15 60 15

## 2014-10-13 NOTE — Discharge Summary (Signed)
This 69 year old came in through the outpatient surgery department prepared for right mastectomy with sentinel lymph node biopsy.  There was a recent finding of  lobular carcinoma of the upper-outer quadrant.  Postoperatively she was kept in observation bed overnight.  She did have some postoperative nausea which resolved with time  On the day after surgery her wound appeared to be healing satisfactorily.  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

## 2014-10-18 NOTE — Addendum Note (Signed)
Addendum  created 10/18/14 1146 by Molli Barrows, MD   Modules edited: Anesthesia Events, Narrator   Narrator:  Narrator: Event Log Edited

## 2014-10-19 NOTE — Progress Notes (Signed)
[facility]  Telephone:(336) [phone] Fax:(336) [phone]  ID: ADLEIGH MCMASTERS OB: Feb 11, 1946  MR#: [id number]  EPP#:[id number]  Patient Care Team: Crecencio Mc, MD as PCP - General (Internal Medicine)  CHIEF COMPLAINT:  Chief Complaint  Patient presents with  . New Evaluation    breast ca    INTERVAL HISTORY: Patient is a 69 year old female who had a routine screening mammogram that was reported as suspicious. Subsequent biopsy revealed invasive lobular breast cancer. Currently, she is anxious but otherwise feels well. She has no neurologic complaints. She denies any fevers. She has a good appetite and denies weight loss. She has no chest pain or shortness of breath. She denies any nausea, vomiting, constipation, or diarrhea. She has no urinary complaints. Patient otherwise feels well and offers no further specific complaints.  REVIEW OF SYSTEMS:   Review of Systems  Constitutional: Negative.   Cardiovascular: Negative.     As per HPI. Otherwise, a complete review of systems is negatve.  PAST MEDICAL HISTORY: Past Medical History  Diagnosis Date  . History of arthroscopy of left knee 1985  . Spinal stenosis     mild  . Hyperlipidemia     mild, with prior statin intolerance  . Osteoporosis     prior Evista use x 5 yrs , 2007  . Hypothyroid   . Tremor   . Viral meningitis     69 years old  . Hashimoto's thyroiditis     PAST SURGICAL HISTORY: Past Surgical History  Procedure Laterality Date  . Foot surgery      plantar fascitis, Dr. Elvina Mattes  . Knee arthroscopy  1985    right  . Cholecystectomy  2006  . Tonsillectomy    . Cesarean section    . Simple mastectomy with axillary sentinel node biopsy Right 10/10/2014    Procedure: SIMPLE MASTECTOMY;  Surgeon: Leonie Green, MD;  Location: [facility] ORS;  Service: General;  Laterality: Right;  . Sentinel node biopsy Right 10/10/2014    Procedure: SENTINEL NODE BIOPSY;  Surgeon: Leonie Green, MD;   Location: [facility] ORS;  Service: General;  Laterality: Right;    FAMILY HISTORY Family History  Problem Relation Age of Onset  . Osteoporosis Mother   . Cancer Sister 32    Breast  . Cancer Maternal Grandfather     breast  . Aortic aneurysm Father   . Breast cancer Paternal Grandmother 71       ADVANCED DIRECTIVES:    HEALTH MAINTENANCE: History  Substance Use Topics  . Smoking status: Never Smoker   . Smokeless tobacco: Never Used  . Alcohol Use: 1.5 oz/week    3 drink(s) per week     Comment: social, 2 times per week     Colonoscopy:  PAP:  Bone density:  Lipid panel:  Allergies  Allergen Reactions  . Statins Other (See Comments)    Reaction:  Leg cramps   . Bee Venom Hives    Current Outpatient Prescriptions  Medication Sig Dispense Refill  . propranolol (INDERAL) 20 MG tablet TAKE ONE TABLET TWICE A DAY 60 tablet 6  . SYNTHROID 88 MCG tablet Take 1 tablet (88 mcg total) by mouth daily. 90 tablet 1  . aspirin EC 81 MG tablet Take 81 mg by mouth daily.    . Cholecalciferol (VITAMIN D3) 2000 UNITS TABS Take 1 tablet by mouth daily.    Marland Kitchen HYDROcodone-acetaminophen (NORCO/VICODIN) 5-325 MG per tablet Take 1-2 tablets by mouth every 4 (four) hours  as needed for moderate pain. 16 tablet 0   No current facility-administered medications for this visit.    OBJECTIVE: Filed Vitals:   10/03/14 1054  BP: 143/85  Pulse: 74  Temp: 98.4 F (36.9 C)  Resp: 18     Body mass index is 24.24 kg/(m^2).    ECOG FS:0 - Asymptomatic  General: Well-developed, well-nourished, no acute distress. Eyes: Pink conjunctiva, anicteric sclera. Breasts: Exam deferred today. Lungs: Clear to auscultation bilaterally. Heart: Regular rate and rhythm. No rubs, murmurs, or gallops. Abdomen: Soft, nontender, nondistended. No organomegaly noted, normoactive bowel sounds. Musculoskeletal: No edema, cyanosis, or clubbing. Neuro: Alert, answering all questions appropriately. Cranial nerves  grossly intact. Skin: No rashes or petechiae noted. Psych: Normal affect.   LAB RESULTS:  Lab Results  Component Value Date   NA 140 10/03/2014   K 4.0 10/03/2014   CL 106 10/03/2014   CO2 27 10/03/2014   GLUCOSE 106* 10/03/2014   BUN 22* 10/03/2014   CREATININE 0.66 10/03/2014   CALCIUM 8.8* 10/03/2014   PROT 7.1 07/25/2014   ALBUMIN 4.2 07/25/2014   AST 12 07/25/2014   ALT 11 07/25/2014   ALKPHOS 81 07/25/2014   BILITOT 0.4 07/25/2014   GFRNONAA >60 10/03/2014   GFRAA >60 10/03/2014    Lab Results  Component Value Date   WBC 7.5 10/03/2014   NEUTROABS 5.3 10/03/2014   HGB 13.4 10/03/2014   HCT 40.7 10/03/2014   MCV 88.9 10/03/2014   PLT 309 10/03/2014     STUDIES: Nm Sentinel Node Inj-no Rpt (breast)  10/10/2014   CLINICAL DATA: right breast sentinel node   Sulfur colloid was injected intradermally by the nuclear medicine  technologist for breast cancer sentinel node localization.    US Breast Ltd Uni Right Inc Axilla  09/20/2014   CLINICAL DATA:  Recall from screening mammogram.  EXAM: DIGITAL DIAGNOSTIC RIGHT MAMMOGRAM WITH 3D TOMOSYNTHESIS  ULTRASOUND RIGHT BREAST  COMPARISON:  09/04/2014, 08/31/2013, 08/30/2012, 08/27/2011.  ACR Breast Density Category c: The breast tissue is heterogeneously dense, which may obscure small masses.  FINDINGS: There is a persistent subtle area of distortion located within the upper outer quadrant of the right breast (anterior 1/3).  On physical exam, there is thickening present within the upper outer quadrant of the right breast without a discrete palpable mass present. There is no palpable right axillary adenopathy.  Targeted ultrasound is performed, showing a subtle hypoechoic area with questionable distortion located within the right breast at the 10 o'clock position 5 cm from the nipple with mild shadowing. By ultrasound this measures 9 x 9 x 8 mm in size.  Ultrasound of the right axilla demonstrates normal axillary contents and no  evidence for adenopathy.  This may be secondary to invasive mammary carcinoma or a complex sclerosing lesion and I recommend image guided core needle biopsy of this area of distortion within the right breast. As the finding on ultrasound is relatively subtle, this would be more reliably sampled via tomosynthesis guided core needle biopsy. I have discussed this with the patient. This may be scheduled at the [facility].  IMPRESSION: Subtle area of distortion located within the right breast at 10 o'clock position. Tissue sampling via tomosynthesis guided core needle biopsy is recommended.  RECOMMENDATION: Right breast tomosynthesis guided core needle biopsy.  I have discussed the findings and recommendations with the patient. Results were also provided in writing at the conclusion of the visit. If applicable, a reminder letter will be sent to the  patient regarding the next appointment.  BI-RADS CATEGORY  4: Suspicious.   Electronically Signed   By: Altamese Cabal M.D.   On: 09/20/2014 12:37   Mm Diag Breast Tomo Uni Right  09/25/2014   CLINICAL DATA:  Post right breast stereotactic biopsy.  EXAM: DIAGNOSTIC RIGHT MAMMOGRAM POST STEREOTACTIC BIOPSY  COMPARISON:  Previous exam(s).  FINDINGS: Mammographic images were obtained following stereotactic guided biopsy of area of distortion located within the upper outer quadrant of the right breast. The X shaped clip is in appropriate position  IMPRESSION: Appropriate positioning of clip following right breast stereotactic biopsy  Final Assessment: Post Procedure Mammograms for Marker Placement   Electronically Signed   By: Altamese Cabal M.D.   On: 09/25/2014 11:30   Mm Diag Breast Tomo Uni Right  09/20/2014   CLINICAL DATA:  Recall from screening mammogram.  EXAM: DIGITAL DIAGNOSTIC RIGHT MAMMOGRAM WITH 3D TOMOSYNTHESIS  ULTRASOUND RIGHT BREAST  COMPARISON:  09/04/2014, 08/31/2013, 08/30/2012, 08/27/2011.  ACR Breast Density Category c: The breast  tissue is heterogeneously dense, which may obscure small masses.  FINDINGS: There is a persistent subtle area of distortion located within the upper outer quadrant of the right breast (anterior 1/3).  On physical exam, there is thickening present within the upper outer quadrant of the right breast without a discrete palpable mass present. There is no palpable right axillary adenopathy.  Targeted ultrasound is performed, showing a subtle hypoechoic area with questionable distortion located within the right breast at the 10 o'clock position 5 cm from the nipple with mild shadowing. By ultrasound this measures 9 x 9 x 8 mm in size.  Ultrasound of the right axilla demonstrates normal axillary contents and no evidence for adenopathy.  This may be secondary to invasive mammary carcinoma or a complex sclerosing lesion and I recommend image guided core needle biopsy of this area of distortion within the right breast. As the finding on ultrasound is relatively subtle, this would be more reliably sampled via tomosynthesis guided core needle biopsy. I have discussed this with the patient. This may be scheduled at the [facility].  IMPRESSION: Subtle area of distortion located within the right breast at 10 o'clock position. Tissue sampling via tomosynthesis guided core needle biopsy is recommended.  RECOMMENDATION: Right breast tomosynthesis guided core needle biopsy.  I have discussed the findings and recommendations with the patient. Results were also provided in writing at the conclusion of the visit. If applicable, a reminder letter will be sent to the patient regarding the next appointment.  BI-RADS CATEGORY  4: Suspicious.   Electronically Signed   By: Altamese Cabal M.D.   On: 09/20/2014 12:37   Mm Rt Breast Bx W Loc Dev 1st Lesion Image Bx Spec Stereo Guide  09/26/2014   ADDENDUM REPORT: 09/26/2014 16:40  ADDENDUM: The pathology associated with the right stereotactic core biopsy demonstrated  invasive mammary carcinoma. This is concordant with the imaging findings. I have discussed the findings with the patient and her husband, Dr Pat Patrick, by telephone and answered their questions. The patient states that her biopsy site is not significantly tender and there is no hematoma formation or signs of infection. Her husband plans to arrange surgical consultation with Dr.Wilton Tamala Julian in [place], [place]. I have asked the patient to contact our [facility] or contact me for any additional questions or concerns.   Electronically Signed   By: Altamese Cabal M.D.   On: 09/26/2014 16:40   09/26/2014   CLINICAL DATA:  Area of distortion noted on recent right breast mammography performed in [place], [place].  EXAM: RIGHT BREAST STEREOTACTIC CORE NEEDLE BIOPSY  COMPARISON:  Previous exams.  FINDINGS: The patient and I discussed the procedure of stereotactic-guided biopsy including benefits and alternatives. We discussed the high likelihood of a successful procedure. We discussed the risks of the procedure including infection, bleeding, tissue injury, clip migration, and inadequate sampling. Informed written consent was given. The usual time out protocol was performed immediately prior to the procedure.  Using sterile technique and 2% lidocaine as local anesthetic, under stereotactic guidance, a 9 gauge vacuum assisted device was used to perform core needle biopsy of area of distortion located within the upper outer quadrant of the right breast using a superior craniocaudal approach.  At the conclusion of the procedure, a X shaped tissue marker clip was deployed into the biopsy cavity. Follow-up 2-view mammogram was performed and dictated separately.  IMPRESSION: Stereotactic-guided biopsy of the area of distortion located within the upper outer quadrant of the right breast as discussed above. No apparent complications.  Electronically Signed: By: Altamese Cabal M.D. 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  PLAN:    1. Breast cancer: Patient will have consultation with Dr. Tamala Julian later this week to determine whether she will pursue lumpectomy or full mastectomy. Patient states she will likely proceed with full mastectomy, but will make final decision later this week. If she has a full mastectomy, she likely will not require XRT. Given the stage of her cancer, she will not require chemotherapy, plus lobular adenocarcinoma is not very chemosensitive. She will benefit from an aromatase inhibitor for 5 years. Return to clinic 3 weeks in the postoperative period for further evaluation, and discuss her final pathology results, and treatment planning.   Patient expressed understanding and was in agreement with this plan. She also understands that She can call clinic at any time with any questions, concerns, or complaints.    Lloyd Huger, MD   10/19/2014 4:49 PM

## 2014-10-24 ENCOUNTER — Inpatient Hospital Stay: Payer: 59 | Attending: Oncology | Admitting: Oncology

## 2014-10-24 VITALS — BP 128/84 | HR 81 | Temp 98.4°F | Resp 16 | Wt 155.0 lb

## 2014-10-24 DIAGNOSIS — Z79811 Long term (current) use of aromatase inhibitors: Secondary | ICD-10-CM | POA: Diagnosis not present

## 2014-10-24 DIAGNOSIS — M8580 Other specified disorders of bone density and structure, unspecified site: Secondary | ICD-10-CM | POA: Diagnosis not present

## 2014-10-24 DIAGNOSIS — Z170 Estrogen receptor positive status [ER+]: Secondary | ICD-10-CM | POA: Diagnosis not present

## 2014-10-24 DIAGNOSIS — C50911 Malignant neoplasm of unspecified site of right female breast: Secondary | ICD-10-CM | POA: Diagnosis not present

## 2014-10-24 DIAGNOSIS — Z780 Asymptomatic menopausal state: Secondary | ICD-10-CM

## 2014-10-24 MED ORDER — LETROZOLE 2.5 MG PO TABS: 2.5000 mg | ORAL_TABLET | Freq: Every day | ORAL | Status: DC

## 2014-10-24 NOTE — Progress Notes (Signed)
Oncology Nurse Navigator Documentation  Oncology Nurse Navigator Flowsheets 09/29/2014 10/03/2014 10/13/2014 10/24/2014  Navigator Encounter Type Telephone Initial MedOnc Telephone Other  Patient Visit Type Follow-up Medonc Medonc Medonc  Treatment Phase Other - Other Other  Barriers/Navigation Needs No barriers at this time - No barriers at this time No barriers at this time  Interventions Coordination of Care Coordination of Care Talk to a survivor Coordination of Care  Coordination of Care MD Appointments MD Appointments - Radiology  Education Method - Teach-back - -  Support Groups/Services - Breast - Breast  Time Spent with Patient 15 3 15 30  Met with patient, and husband at post-mastectomy Med-ONC visit.  Dr. Grayland Ormond discussed need for antihormonal treatment Femara daily x 5 years.  Patient is scheduled for [facility] 6/14/16at 0940, and she will return for 3 month follow-up with Dr. Grayland Ormond 01/31/15 at 10:40.  Reviewed potential side effects of Femara.

## 2014-10-31 ENCOUNTER — Ambulatory Visit
Admission: RE | Admit: 2014-10-31 | Discharge: 2014-10-31 | Disposition: A | Discharge status: Home / Self Care | Payer: 59 | Source: Ambulatory Visit | Attending: Oncology | Admitting: Oncology

## 2014-10-31 DIAGNOSIS — M8580 Other specified disorders of bone density and structure, unspecified site: Secondary | ICD-10-CM | POA: Diagnosis not present

## 2014-10-31 DIAGNOSIS — Z780 Asymptomatic menopausal state: Secondary | ICD-10-CM

## 2014-11-18 NOTE — Progress Notes (Signed)
[place]  Telephone:(336) [phone] Fax:(336) [phone]  ID: [name] OB: 02/22/1946  MR#: [id number]  UYQ#:[id number]  Patient Care Team: Crecencio Mc, MD as PCP - General (Internal Medicine)  CHIEF COMPLAINT:  Chief Complaint  Patient presents with  . Follow-up    breast cancer    INTERVAL HISTORY: Patient returns to clinic today for further evaluation, discussion of her final pathology results, and treatment planning. She continues to be anxious, but otherwise feels well. She has no neurologic complaints. She denies any fevers. She has a good appetite and denies weight loss. She has no chest pain or shortness of breath. She denies any nausea, vomiting, constipation, or diarrhea. She has no urinary complaints. Patient otherwise feels well and offers no further specific complaints.  REVIEW OF SYSTEMS:   Review of Systems  Constitutional: Negative.   Cardiovascular: Negative.     As per HPI. Otherwise, a complete review of systems is negatve.  PAST MEDICAL HISTORY: Past Medical History  Diagnosis Date  . History of arthroscopy of left knee 1985  . Spinal stenosis     mild  . Hyperlipidemia     mild, with prior statin intolerance  . Osteoporosis     prior Evista use x 5 yrs , 2007  . Hypothyroid   . Tremor   . Viral meningitis     69 years old  . Hashimoto's thyroiditis     PAST SURGICAL HISTORY: Past Surgical History  Procedure Laterality Date  . Foot surgery      plantar fascitis, Dr. Elvina Mattes  . Knee arthroscopy  1985    right  . Cholecystectomy  2006  . Tonsillectomy    . Cesarean section    . Simple mastectomy with axillary sentinel node biopsy Right 10/10/2014    Procedure: SIMPLE MASTECTOMY;  Surgeon: Leonie Green, MD;  Location: [facility] ORS;  Service: General;  Laterality: Right;  . Sentinel node biopsy Right 10/10/2014    Procedure: SENTINEL NODE BIOPSY;  Surgeon: Leonie Green, MD;  Location: [facility] ORS;  Service: General;   Laterality: Right;    FAMILY HISTORY Family History  Problem Relation Age of Onset  . Osteoporosis Mother   . Cancer Sister 84    Breast  . Cancer Maternal Grandfather     breast  . Aortic aneurysm Father   . Breast cancer Paternal Grandmother 42       ADVANCED DIRECTIVES:    HEALTH MAINTENANCE: History  Substance Use Topics  . Smoking status: Never Smoker   . Smokeless tobacco: Never Used  . Alcohol Use: 1.5 oz/week    3 drink(s) per week     Comment: social, 2 times per week     Colonoscopy:  PAP:  Bone density:  Lipid panel:  Allergies  Allergen Reactions  . Statins Other (See Comments)    Reaction:  Leg cramps   . Bee Venom Hives    Current Outpatient Prescriptions  Medication Sig Dispense Refill  . aspirin EC 81 MG tablet Take 81 mg by mouth daily.    . Cholecalciferol (VITAMIN D3) 2000 UNITS TABS Take 1 tablet by mouth daily.    Marland Kitchen HYDROcodone-acetaminophen (NORCO/VICODIN) 5-325 MG per tablet Take 1-2 tablets by mouth every 4 (four) hours as needed for moderate pain. 16 tablet 0  . propranolol (INDERAL) 20 MG tablet TAKE ONE TABLET TWICE A DAY 60 tablet 6  . SYNTHROID 88 MCG tablet Take 1 tablet (88 mcg total) by mouth daily. [place]  tablet 1  . letrozole (FEMARA) 2.5 MG tablet Take 1 tablet (2.5 mg total) by mouth daily. 30 tablet 11   No current facility-administered medications for this visit.    OBJECTIVE: Filed Vitals:   10/24/14 1041  BP: 128/84  Pulse: 81  Temp: 98.4 F (36.9 C)  Resp: 16     Body mass index is 23.57 kg/(m^2).    ECOG FS:0 - Asymptomatic  General: Well-developed, well-nourished, no acute distress. Eyes: Anicteric sclera. Breasts: Exam deferred today. Lungs: Clear to auscultation bilaterally. Heart: Regular rate and rhythm. No rubs, murmurs, or gallops. Abdomen: Soft, nontender, nondistended. No organomegaly noted, normoactive bowel sounds. Musculoskeletal: No edema, cyanosis, or clubbing. Neuro: Alert, answering all  questions appropriately. Cranial nerves grossly intact. Skin: No rashes or petechiae noted. Psych: Normal affect.   LAB RESULTS:  Lab Results  Component Value Date   NA 140 10/03/2014   K 4.0 10/03/2014   CL 106 10/03/2014   CO2 27 10/03/2014   GLUCOSE 106* 10/03/2014   BUN 22* 10/03/2014   CREATININE 0.66 10/03/2014   CALCIUM 8.8* 10/03/2014   PROT 7.1 07/25/2014   ALBUMIN 4.2 07/25/2014   AST 12 07/25/2014   ALT 11 07/25/2014   ALKPHOS 81 07/25/2014   BILITOT 0.4 07/25/2014   GFRNONAA >60 10/03/2014   GFRAA >60 10/03/2014    Lab Results  Component Value Date   WBC 7.5 10/03/2014   NEUTROABS 5.3 10/03/2014   HGB 13.4 10/03/2014   HCT 40.7 10/03/2014   MCV 88.9 10/03/2014   PLT 309 10/03/2014     STUDIES: Dg Bone Density  10/31/2014   EXAM: DUAL X-RAY ABSORPTIOMETRY (DXA) FOR BONE MINERAL DENSITY  IMPRESSION: Dear Dr. Delight Hoh,  Your patient Tiffany Chang completed a BMD test on 10/31/2014 using the [place] (analysis version: 14.10) manufactured by [employer]. The following summarizes the results of our evaluation.  PATIENT BIOGRAPHICAL: Name: Tiffany Chang, Tiffany Chang Patient ID: [id number] Birth Date: Mar 13, 1946 Height: 68.0 in. Gender: Female Exam Date: 10/31/2014 Weight: 156.5 lbs. Indications: Caucasian, Family Hx of Osteoporosis, History of Breast Cancer, Hypothyroid, Postmenopausal Fractures: Treatments: ASPRIN 81 MG, Calcium, Synthroid, Vit D  ASSESSMENT:  The BMD measured at Femur Neck Right is 0.805 g/cm2 with a T-score of -1.7. This patient's diagnostic category is considered LOW BONE MASS/ OSTEOPENIA according to [facility]) criteria. L4 was not utilized due to advanced degenerative changes.  Site Region Measured Measured WHO Young Adult BMD Date       Age      Classification T-score AP Spine L1-L3 10/31/2014 67.0 Normal -0.7 1.099 g/cm2 AP Spine L1-L3 08/31/2013 65.5 Normal -0.6 1.106 g/cm2 AP Spine L1-L3 02/27/2010 62.0 Normal -0.6 1.104  g/cm2  DualFemur Neck Right 10/31/2014 67.0 Osteopenia -1.7 0.805 g/cm2 DualFemur Neck Right 02/27/2010 62.0 Osteopenia -1.8 0.793 g/cm2  World Health Organization [facility]) criteria for post-menopausal, Caucasian Women: Normal:       T-score at or above -1 SD Osteopenia:   T-score between -1 and -2.5 SD Osteoporosis: T-score at or below -2.5 SD  COMPARISON: 08/31/2013. Since the prior study, there has been NO significant change in bone mineral density of the lumbar spine or hips.  RECOMMENDATIONS: National Osteoporosis Foundation recommends that FDA-approved medical therapies be considered in postmenopausal women and men age 79 or older with a: 1. Hip or vertebral (clinical or morphometric) fracture. 2. T-score of < -2.5 at the spine or hip. 3. Ten-year fracture probability by FRAX of 3% or greater for  hip fracture or 20% or greater for major osteoporotic fracture.  All treatment decisions require clinical judgment and consideration of individual patient factors, including patient preferences, co-morbidities, previous drug use, risk factors not captured in the FRAX model (e.g. falls, vitamin D deficiency, increased bone turnover, interval significant decline in bone density) and possible under - or over-estimation of fracture risk by FRAX.  All patients should ensure an adequate intake of dietary calcium (1200 mg/d) and vitamin D (800 IU daily) unless contraindicated.  FOLLOW-UP: People with diagnosed cases of osteoporosis or at high risk for fracture should have regular bone mineral density tests. For patients eligible for Medicare, routine testing is allowed once every 2 years. The testing frequency can be increased to one year for patients who have rapidly progressing disease, those who are receiving or discontinuing medical therapy to restore bone mass, or have additional risk factors.  I have reviewed this report, and agree with the above findings. Hassan Rowan, MD [facility] Radiology  Dear Dr. Delight Hoh,  Your  patient William Dalton completed a FRAX assessment on 10/31/2014 using the [place] (analysis version: 14.10) manufactured by [employer]. The following summarizes the results of our evaluation.  PATIENT BIOGRAPHICAL: Name: VIENNE, CORCORAN Patient ID: [id number] Birth Date: 28-Jul-1945 Height:    68.0 in. Gender:     Female    [age]       Weight:    156.5 lbs. Ethnicity:  White                            Exam Date: 10/31/2014  FRAX* RESULTS:  (version: 3.5) 10-year Probability of Fracture1 Major Osteoporotic Fracture2 Hip Fracture 9.7% 1.3% Population: [place] (Caucasian) Risk Factors: None  Based on Femur (Right) Neck BMD  1 -The 10-year probability of fracture may be lower than reported if the patient has received treatment. 2 -Major Osteoporotic Fracture: Clinical Spine, Forearm, Hip or Shoulder  *FRAX is a Materials engineer of the [employer] for Metabolic Bone Disease, a [place] (WHO) [facility].  ASSESSMENT: The probability of a major osteoporotic fracture is 9.7% within the next ten years.  The probability of a hip fracture is 1.3% within the next ten years.  Hassan Rowan, MD [facility] Radiology   Electronically Signed   By: Margarette [place] M.D.   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  PLAN:    1. Breast cancer: Patient ultimately decided to have a full mastectomy therefore she does not require adjuvant XRT. Given the fact that her malignancy is lobular in nature, she does not require chemotherapy at this time. She will require an aromatase inhibitor and was given a prescription for letrozole. She will take letrozole for 5 years completing in June 2021. No further intervention is needed at this time. Return to clinic in 3 months with repeat laboratory work and further evaluation.   Approximately 30 minutes was spent in discussion and consultation.    Patient expressed understanding and was in agreement with this plan. She also understands that She can call clinic at any time with any questions, concerns, or complaints.    Lloyd Huger, MD   11/18/2014 5:11 PM

## 2015-01-16 ENCOUNTER — Other Ambulatory Visit: Payer: Self-pay | Admitting: Internal Medicine

## 2015-01-24 ENCOUNTER — Ambulatory Visit: Payer: Medicare Other | Admitting: Oncology

## 2015-01-31 ENCOUNTER — Inpatient Hospital Stay: Payer: 59 | Attending: Oncology | Admitting: Oncology

## 2015-01-31 VITALS — BP 122/77 | HR 86 | Temp 95.9°F | Resp 16 | Wt 161.6 lb

## 2015-01-31 DIAGNOSIS — Z170 Estrogen receptor positive status [ER+]: Secondary | ICD-10-CM | POA: Insufficient documentation

## 2015-01-31 DIAGNOSIS — E063 Autoimmune thyroiditis: Secondary | ICD-10-CM | POA: Diagnosis not present

## 2015-01-31 DIAGNOSIS — Z79899 Other long term (current) drug therapy: Secondary | ICD-10-CM | POA: Diagnosis not present

## 2015-01-31 DIAGNOSIS — Z79811 Long term (current) use of aromatase inhibitors: Secondary | ICD-10-CM | POA: Diagnosis not present

## 2015-01-31 DIAGNOSIS — Z9013 Acquired absence of bilateral breasts and nipples: Secondary | ICD-10-CM | POA: Insufficient documentation

## 2015-01-31 DIAGNOSIS — C50911 Malignant neoplasm of unspecified site of right female breast: Secondary | ICD-10-CM | POA: Diagnosis not present

## 2015-01-31 DIAGNOSIS — E039 Hypothyroidism, unspecified: Secondary | ICD-10-CM | POA: Diagnosis not present

## 2015-01-31 DIAGNOSIS — M8580 Other specified disorders of bone density and structure, unspecified site: Secondary | ICD-10-CM | POA: Diagnosis not present

## 2015-01-31 DIAGNOSIS — E785 Hyperlipidemia, unspecified: Secondary | ICD-10-CM | POA: Insufficient documentation

## 2015-01-31 DIAGNOSIS — Z7982 Long term (current) use of aspirin: Secondary | ICD-10-CM | POA: Diagnosis not present

## 2015-02-08 NOTE — Progress Notes (Signed)
[place]  Telephone:(336) [phone] Fax:(336) [phone]  ID: Tiffany Chang OB: 1945-06-16  MR#: [id number]  DXA#:[id number]  Patient Care Team: Crecencio Mc, MD as PCP - General (Internal Medicine)  CHIEF COMPLAINT:  Chief Complaint  Patient presents with  . Breast Cancer    INTERVAL HISTORY: Patient returns to clinic today for further evaluation and routine 3 month follow-up. She is tolerating letrozole well without significant side effects. She currently feels well and is asymptomatic. She has no neurologic complaints. She denies any fevers. She has a good appetite and denies weight loss. She has no chest pain or shortness of breath. She denies any nausea, vomiting, constipation, or diarrhea. She has no urinary complaints. Patient offers no specific complaints today.  REVIEW OF SYSTEMS:   Review of Systems  Constitutional: Negative.   Cardiovascular: Negative.   Musculoskeletal: Negative.   Neurological: Negative.     As per HPI. Otherwise, a complete review of systems is negatve.  PAST MEDICAL HISTORY: Past Medical History  Diagnosis Date  . History of arthroscopy of left knee 1985  . Spinal stenosis     mild  . Hyperlipidemia     mild, with prior statin intolerance  . Osteoporosis     prior Evista use x 5 yrs , 2007  . Hypothyroid   . Tremor   . Viral meningitis     69 years old  . Hashimoto's thyroiditis     PAST SURGICAL HISTORY: Past Surgical History  Procedure Laterality Date  . Foot surgery      plantar fascitis, Dr. Elvina Mattes  . Knee arthroscopy  1985    right  . Cholecystectomy  2006  . Tonsillectomy    . Cesarean section    . Simple mastectomy with axillary sentinel node biopsy Right 10/10/2014    Procedure: SIMPLE MASTECTOMY;  Surgeon: Leonie Green, MD;  Location: [facility] ORS;  Service: General;  Laterality: Right;  . Sentinel node biopsy Right 10/10/2014    Procedure: SENTINEL NODE BIOPSY;  Surgeon: Leonie Green, MD;   Location: [facility] ORS;  Service: General;  Laterality: Right;    FAMILY HISTORY Family History  Problem Relation Age of Onset  . Osteoporosis Mother   . Cancer Sister 50    Breast  . Cancer Maternal Grandfather     breast  . Aortic aneurysm Father   . Breast cancer Paternal Grandmother 54       ADVANCED DIRECTIVES:    HEALTH MAINTENANCE: Social History  Substance Use Topics  . Smoking status: Never Smoker   . Smokeless tobacco: Never Used  . Alcohol Use: 1.5 oz/week    3 drink(s) per week     Comment: social, 2 times per week     Colonoscopy:  PAP:  Bone density:  Lipid panel:  Allergies  Allergen Reactions  . Statins Other (See Comments)    Reaction:  Leg cramps   . Bee Venom Hives    Current Outpatient Prescriptions  Medication Sig Dispense Refill  . aspirin EC 81 MG tablet Take 81 mg by mouth daily.    . Cholecalciferol (VITAMIN D3) 2000 UNITS TABS Take 1 tablet by mouth daily.    Marland Kitchen letrozole (FEMARA) 2.5 MG tablet Take 1 tablet (2.5 mg total) by mouth daily. 30 tablet 11  . propranolol (INDERAL) 20 MG tablet TAKE ONE TABLET TWICE A DAY 60 tablet 6  . SYNTHROID 88 MCG tablet TAKE 1 TABLET BY MOUTH ONCE DAILY 30 tablet 0  No current facility-administered medications for this visit.    OBJECTIVE: Filed Vitals:   01/31/15 1031  BP: 122/77  Pulse: 86  Temp: 95.9 F (35.5 C)  Resp: 16     Body mass index is 24.58 kg/(m^2).    ECOG FS:0 - Asymptomatic  General: Well-developed, well-nourished, no acute distress. Eyes: Anicteric sclera. Breasts: Exam deferred today. Lungs: Clear to auscultation bilaterally. Heart: Regular rate and rhythm. No rubs, murmurs, or gallops. Abdomen: Soft, nontender, nondistended. No organomegaly noted, normoactive bowel sounds. Musculoskeletal: No edema, cyanosis, or clubbing. Neuro: Alert, answering all questions appropriately. Cranial nerves grossly intact. Skin: No rashes or petechiae noted. Psych: Normal affect.   LAB  RESULTS:  Lab Results  Component Value Date   NA 140 10/03/2014   K 4.0 10/03/2014   CL 106 10/03/2014   CO2 27 10/03/2014   GLUCOSE 106* 10/03/2014   BUN 22* 10/03/2014   CREATININE 0.66 10/03/2014   CALCIUM 8.8* 10/03/2014   PROT 7.1 07/25/2014   ALBUMIN 4.2 07/25/2014   AST 12 07/25/2014   ALT 11 07/25/2014   ALKPHOS 81 07/25/2014   BILITOT 0.4 07/25/2014   GFRNONAA >60 10/03/2014   GFRAA >60 10/03/2014    Lab Results  Component Value Date   WBC 7.5 10/03/2014   NEUTROABS 5.3 10/03/2014   HGB 13.4 10/03/2014   HCT 40.7 10/03/2014   MCV 88.9 10/03/2014   PLT 309 10/03/2014     STUDIES: No results found.  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  PLAN:    1. Breast cancer: Patient ultimately decided to have a full mastectomy therefore she did not require adjuvant XRT. Given the fact that her malignancy is lobular in nature, she does not require chemotherapy at this time. Continue letrozole for 5 years completing in June 2021. No further intervention is needed at this time. Return to clinic in 6 months with repeat laboratory work and further evaluation.  2. Osteopenia: Bone mineral density in June 2016 revealed a T score of -1.7. Repeat in one year.  Patient expressed understanding and was in agreement with this plan. She also understands that She can call clinic at any time with any questions, concerns, or complaints.    Lloyd Huger, MD   02/08/2015 4:32 PM

## 2015-02-27 ENCOUNTER — Telehealth: Payer: Self-pay | Admitting: Internal Medicine

## 2015-02-27 NOTE — Telephone Encounter (Signed)
Form is in Dr. Derrel Nip red folder to sign.

## 2015-02-27 NOTE — Telephone Encounter (Signed)
Juliann Pulse please be advised of this.

## 2015-02-27 NOTE — Progress Notes (Unsigned)
Card sent to patient for follow up.

## 2015-02-27 NOTE — Telephone Encounter (Signed)
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 It is in Dr Derrel Nip box. Thank You!

## 2015-02-28 NOTE — Telephone Encounter (Signed)
Patient notified form ready and copy placed at front desk.

## 2015-02-28 NOTE — Telephone Encounter (Signed)
Completed and returned to [facility] in red folder.  No charge

## 2015-03-12 ENCOUNTER — Other Ambulatory Visit: Payer: Self-pay | Admitting: Internal Medicine

## 2015-03-12 MED ORDER — PREDNISONE 10 MG PO TABS: ORAL_TABLET | ORAL | Status: DC

## 2015-03-12 MED ORDER — AMOXICILLIN-POT CLAVULANATE 875-125 MG PO TABS: 1.0000 | ORAL_TABLET | Freq: Two times a day (BID) | ORAL | Status: DC

## 2015-03-15 ENCOUNTER — Other Ambulatory Visit: Payer: Self-pay | Admitting: Internal Medicine

## 2015-03-15 NOTE — Telephone Encounter (Signed)
Last OV was 07/24/14? Please advise?

## 2015-03-15 NOTE — Telephone Encounter (Signed)
Ok to refill,  Refill sent  

## 2015-04-19 ENCOUNTER — Other Ambulatory Visit: Payer: Self-pay | Admitting: Internal Medicine

## 2015-04-26 ENCOUNTER — Encounter: Payer: Self-pay | Admitting: Internal Medicine

## 2015-06-19 ENCOUNTER — Other Ambulatory Visit: Payer: Self-pay | Admitting: Internal Medicine

## 2015-06-26 ENCOUNTER — Encounter: Payer: Self-pay | Admitting: Internal Medicine

## 2015-07-09 ENCOUNTER — Encounter: Payer: Self-pay | Admitting: Internal Medicine

## 2015-07-25 ENCOUNTER — Encounter: Payer: Self-pay | Admitting: Internal Medicine

## 2015-07-25 ENCOUNTER — Ambulatory Visit (INDEPENDENT_AMBULATORY_CARE_PROVIDER_SITE_OTHER): Payer: Medicare Other | Admitting: Internal Medicine

## 2015-07-25 VITALS — BP 118/64 | HR 81 | Temp 98.4°F | Resp 12 | Ht 67.25 in | Wt 162.0 lb

## 2015-07-25 DIAGNOSIS — H6120 Impacted cerumen, unspecified ear: Secondary | ICD-10-CM | POA: Insufficient documentation

## 2015-07-25 DIAGNOSIS — G250 Essential tremor: Secondary | ICD-10-CM | POA: Diagnosis not present

## 2015-07-25 DIAGNOSIS — R5383 Other fatigue: Secondary | ICD-10-CM | POA: Diagnosis not present

## 2015-07-25 DIAGNOSIS — H6122 Impacted cerumen, left ear: Secondary | ICD-10-CM

## 2015-07-25 DIAGNOSIS — E785 Hyperlipidemia, unspecified: Secondary | ICD-10-CM | POA: Diagnosis not present

## 2015-07-25 DIAGNOSIS — E559 Vitamin D deficiency, unspecified: Secondary | ICD-10-CM | POA: Diagnosis not present

## 2015-07-25 DIAGNOSIS — Z0000 Encounter for general adult medical examination without abnormal findings: Secondary | ICD-10-CM

## 2015-07-25 DIAGNOSIS — M8580 Other specified disorders of bone density and structure, unspecified site: Secondary | ICD-10-CM

## 2015-07-25 DIAGNOSIS — Z7289 Other problems related to lifestyle: Secondary | ICD-10-CM

## 2015-07-25 NOTE — Assessment & Plan Note (Addendum)
Tremor is worsening ,  Affecting right hand greater than left.  .  She has not seen Tiffany Chang in 2 years.

## 2015-07-25 NOTE — Progress Notes (Signed)
Pre-visit discussion using our clinic review tool. No additional management support is needed unless otherwise documented below in the visit note.  

## 2015-07-25 NOTE — Patient Instructions (Addendum)
Colace (docusate) 200 mg daiy for the constipation (stool softener)   You need 25 to 35 g fiber daily    To make a low carb chip :  Take the [employer] or Pita bread,  Or the [place] Low carb whole wheat tortilla   Place on metal cookie sheet  Brush with olive oil  Sprinkle garlic powder (NOT garlic salt), grated parmesan cheese, mediterranean seasoning , or all of them?  Bake at 225 or 250 for 90 minutes   Try the Kevita probiotic beverage;  You can find it at the [place].   Menopause is a normal process in which your reproductive ability comes to an end. This process happens gradually over a span of months to years, usually between the ages of 28 and 1. Menopause is complete when you have missed 12 consecutive menstrual periods. It is important to talk with your health care provider about some of the most common conditions that affect postmenopausal women, such as heart disease, cancer, and bone loss (osteoporosis). Adopting a healthy lifestyle and getting preventive care can help to promote your health and wellness. Those actions can also lower your chances of developing some of these common conditions. WHAT SHOULD I KNOW ABOUT MENOPAUSE? During menopause, you may experience a number of symptoms, such as:  Moderate-to-severe hot flashes.  Night sweats.  Decrease in sex drive.  Mood swings.  Headaches.  Tiredness.  Irritability.  Memory problems.  Insomnia. Choosing to treat or not to treat menopausal changes is an individual decision that you make with your health care provider. WHAT SHOULD I KNOW ABOUT HORMONE REPLACEMENT THERAPY AND SUPPLEMENTS? Hormone therapy products are effective for treating symptoms that are associated with menopause, such as hot flashes and night sweats. Hormone replacement carries certain risks, especially as you become older. If you are thinking about using estrogen or estrogen with progestin treatments, discuss the benefits and risks with  your health care provider. WHAT SHOULD I KNOW ABOUT HEART DISEASE AND STROKE? Heart disease, heart attack, and stroke become more likely as you age. This may be due, in part, to the hormonal changes that your body experiences during menopause. These can affect how your body processes dietary fats, triglycerides, and cholesterol. Heart attack and stroke are both medical emergencies. There are many things that you can do to help prevent heart disease and stroke:  Have your blood pressure checked at least every 1-2 years. High blood pressure causes heart disease and increases the risk of stroke.  If you are 10-1 years old, ask your health care provider if you should take aspirin to prevent a heart attack or a stroke.  Do not use any tobacco products, including cigarettes, chewing tobacco, or electronic cigarettes. If you need help quitting, ask your health care provider.  It is important to eat a healthy diet and maintain a healthy weight.  Be sure to include plenty of vegetables, fruits, low-fat dairy products, and lean protein.  Avoid eating foods that are high in solid fats, added sugars, or salt (sodium).  Get regular exercise. This is one of the most important things that you can do for your health.  Try to exercise for at least 150 minutes each week. The type of exercise that you do should increase your heart rate and make you sweat. This is known as moderate-intensity exercise.  Try to do strengthening exercises at least twice each week. Do these in addition to the moderate-intensity exercise.  Know your numbers.Ask your health care provider  to check your cholesterol and your blood glucose. Continue to have your blood tested as directed by your health care provider. WHAT SHOULD I KNOW ABOUT CANCER SCREENING? There are several types of cancer. Take the following steps to reduce your risk and to catch any cancer development as early as possible. Breast Cancer  Practice breast  self-awareness.  This means understanding how your breasts normally appear and feel.  It also means doing regular breast self-exams. Let your health care provider know about any changes, no matter how small.  If you are 92 or older, have a clinician do a breast exam (clinical breast exam or CBE) every year. Depending on your age, family history, and medical history, it may be recommended that you also have a yearly breast X-ray (mammogram).  If you have a family history of breast cancer, talk with your health care provider about genetic screening.  If you are at high risk for breast cancer, talk with your health care provider about having an MRI and a mammogram every year.  Breast cancer (BRCA) gene test is recommended for women who have family members with BRCA-related cancers. Results of the assessment will determine the need for genetic counseling and BRCA1 and for BRCA2 testing. BRCA-related cancers include these types:  Breast. This occurs in males or females.  Ovarian.  Tubal. This may also be called fallopian tube cancer.  Cancer of the abdominal or pelvic lining (peritoneal cancer).  Prostate.  Pancreatic. Cervical, Uterine, and Ovarian Cancer Your health care provider may recommend that you be screened regularly for cancer of the pelvic organs. These include your ovaries, uterus, and vagina. This screening involves a pelvic exam, which includes checking for microscopic changes to the surface of your cervix (Pap test).  For women ages 21-65, health care providers may recommend a pelvic exam and a Pap test every three years. For women ages 49-65, they may recommend the Pap test and pelvic exam, combined with testing for human papilloma virus (HPV), every five years. Some types of HPV increase your risk of cervical cancer. Testing for HPV may also be done on women of any age who have unclear Pap test results.  Other health care providers may not recommend any screening for  nonpregnant women who are considered low risk for pelvic cancer and have no symptoms. Ask your health care provider if a screening pelvic exam is right for you.  If you have had past treatment for cervical cancer or a condition that could lead to cancer, you need Pap tests and screening for cancer for at least 20 years after your treatment. If Pap tests have been discontinued for you, your risk factors (such as having a new sexual partner) need to be reassessed to determine if you should start having screenings again. Some women have medical problems that increase the chance of getting cervical cancer. In these cases, your health care provider may recommend that you have screening and Pap tests more often.  If you have a family history of uterine cancer or ovarian cancer, talk with your health care provider about genetic screening.  If you have vaginal bleeding after reaching menopause, tell your health care provider.  There are currently no reliable tests available to screen for ovarian cancer. Lung Cancer Lung cancer screening is recommended for adults 30-71 years old who are at high risk for lung cancer because of a history of smoking. A yearly low-dose CT scan of the lungs is recommended if you:  Currently smoke.  Have  a history of at least 30 pack-years of smoking and you currently smoke or have quit within the past 15 years. A pack-year is smoking an average of one pack of cigarettes per day for one year. Yearly screening should:  Continue until it has been 15 years since you quit.  Stop if you develop a health problem that would prevent you from having lung cancer treatment. Colorectal Cancer  This type of cancer can be detected and can often be prevented.  Routine colorectal cancer screening usually begins at age 62 and continues through age 52.  If you have risk factors for colon cancer, your health care provider may recommend that you be screened at an earlier age.  If you have  a family history of colorectal cancer, talk with your health care provider about genetic screening.  Your health care provider may also recommend using home test kits to check for hidden blood in your stool.  A small camera at the end of a tube can be used to examine your colon directly (sigmoidoscopy or colonoscopy). This is done to check for the earliest forms of colorectal cancer.  Direct examination of the colon should be repeated every 5-10 years until age 12. However, if early forms of precancerous polyps or small growths are found or if you have a family history or genetic risk for colorectal cancer, you may need to be screened more often. Skin Cancer  Check your skin from head to toe regularly.  Monitor any moles. Be sure to tell your health care provider:  About any new moles or changes in moles, especially if there is a change in a mole's shape or color.  If you have a mole that is larger than the size of a pencil eraser.  If any of your family members has a history of skin cancer, especially at a young age, talk with your health care provider about genetic screening.  Always use sunscreen. Apply sunscreen liberally and repeatedly throughout the day.  Whenever you are outside, protect yourself by wearing long sleeves, pants, a wide-brimmed hat, and sunglasses. WHAT SHOULD I KNOW ABOUT OSTEOPOROSIS? Osteoporosis is a condition in which bone destruction happens more quickly than new bone creation. After menopause, you may be at an increased risk for osteoporosis. To help prevent osteoporosis or the bone fractures that can happen because of osteoporosis, the following is recommended:  If you are 63-53 years old, get at least 1,000 mg of calcium and at least 600 mg of vitamin D per day.  If you are older than age 21 but younger than age 32, get at least 1,200 mg of calcium and at least 600 mg of vitamin D per day.  If you are older than age 70, get at least 1,200 mg of calcium and  at least 800 mg of vitamin D per day. Smoking and excessive alcohol intake increase the risk of osteoporosis. Eat foods that are rich in calcium and vitamin D, and do weight-bearing exercises several times each week as directed by your health care provider. WHAT SHOULD I KNOW ABOUT HOW MENOPAUSE AFFECTS [place]? Depression may occur at any age, but it is more common as you become older. Common symptoms of depression include:  Low or sad mood.  Changes in sleep patterns.  Changes in appetite or eating patterns.  Feeling an overall lack of motivation or enjoyment of activities that you previously enjoyed.  Frequent crying spells. Talk with your health care provider if you think that  you are experiencing depression. WHAT SHOULD I KNOW ABOUT IMMUNIZATIONS? It is important that you get and maintain your immunizations. These include:  Tetanus, diphtheria, and pertussis (Tdap) booster vaccine.  Influenza every year before the flu season begins.  Pneumonia vaccine.  Shingles vaccine. Your health care provider may also recommend other immunizations.   This information is not intended to replace advice given to you by your health care provider. Make sure you discuss any questions you have with your health care provider.   Document Released: 06/27/2005 Document Revised: 05/26/2014 Document Reviewed: 01/05/2014 Elsevier Interactive Patient Education [employer].

## 2015-07-25 NOTE — Progress Notes (Signed)
Patient ID: Tiffany Chang, female    DOB: [birth year]  Age: 70 y.o. MRN: [id number]  The patient is here for annual Medicare wellness examination and management of other chronic and acute problems.  Diagnosed with BRCA  May 2016, s/p  simplel mastectomy right breast to avoid XRT .  Lobular,  Letrozole started June 2016 Osteopenia by DEXA June 2016   One year repeat advised  PAP 2015 Colonoscopy 2014 [place] one polyp found  Tubular adenoma  Repeat 07/2017  Body changes. feels like she has gained weight although her weight has not changed Exercising  Not as vigorously as before   bowels becoming more constipated.  Never been a problem until now    ET getting a little worse.  R > L . Having to eat with left hand more.  Writing slower.   wants to see neurology   Tiffany Chang  Needs afternoon appointment.   Using inderal   Arthritis and back issues stable Sleeping ok  Taking calcium supplements citra cal twice daily     The risk factors are reflected in the social history.  The roster of all physicians providing medical care to patient - is listed in the Snapshot section of the chart.  Activities of daily living:  The patient is 100% independent in all ADLs: dressing, toileting, feeding as well as independent mobility  Home safety : The patient has smoke detectors in the home. They wear seatbelts.  There are no firearms at home. There is no violence in the home.   There is no risks for hepatitis, STDs or HIV. There is no   history of blood transfusion. They have no travel history to infectious disease endemic areas of the world.  The patient has seen their dentist in the last six month. They have seen their eye doctor in the last year. They admit to slight hearing difficulty with regard to whispered voices and some television programs.  They have deferred audiologic testing in the last year.  They do not  have excessive sun exposure. Discussed the need for sun protection: hats, long sleeves and use  of sunscreen if there is significant sun exposure.   Diet: the importance of a healthy diet is discussed. They do have a healthy diet.  The benefits of regular aerobic exercise were discussed. She walks 4 times per week ,  20 minutes.   Depression screen: there are no signs or vegative symptoms of depression- irritability, change in appetite, anhedonia, sadness/tearfullness.  Cognitive assessment: the patient manages all their financial and personal affairs and is actively engaged. They could relate day,date,year and events; recalled 2/3 objects at 3 minutes; performed clock-face test normally.  The following portions of the patient's history were reviewed and updated as appropriate: allergies, current medications, past family history, past medical history,  past surgical history, past social history  and problem list.  Visual acuity was not assessed per patient preference since she has regular follow up with her ophthalmologist. Hearing and body mass index were assessed and reviewed.   During the course of the visit the patient was educated and counseled about appropriate screening and preventive services including : fall prevention , diabetes screening, nutrition counseling, colorectal cancer screening, and recommended immunizations.    CC: The primary encounter diagnosis was Benign essential tremor. Diagnoses of Vitamin D deficiency, Other fatigue, Hyperlipidemia, Other problems related to lifestyle, Cerumen impaction, left, Osteopenia, and Medicare annual wellness visit, subsequent were also pertinent to this visit.  History Tiffany Chang has  a past medical history of History of arthroscopy of left knee (1985); Spinal stenosis; Hyperlipidemia; Osteoporosis; Hypothyroid; Tremor; Viral meningitis; and Hashimoto's thyroiditis.   She has past surgical history that includes Foot surgery; Knee arthroscopy (1985); Cholecystectomy (2006); Tonsillectomy; Cesarean section; Simple mastectomy with axillary  sentinel node biopsy (Right, 10/10/2014); and Sentinel node biopsy (Right, 10/10/2014).   Her family history includes Aortic aneurysm in her father; Breast cancer (age of onset: 17) in her paternal grandmother; Cancer in her maternal grandfather; Cancer (age of onset: 53) in her sister; Osteoporosis in her mother.She reports that she has never smoked. She has never used smokeless tobacco. She reports that she drinks about 1.5 oz of alcohol per week. She reports that she does not use illicit drugs.  Outpatient Prescriptions Prior to Visit  Medication Sig Dispense Refill  . amoxicillin-clavulanate (AUGMENTIN) 875-125 MG tablet Take 1 tablet by mouth 2 (two) times daily. 14 tablet 0  . aspirin EC 81 MG tablet Take 81 mg by mouth daily.    . Cholecalciferol (VITAMIN D3) 2000 UNITS TABS Take 1 tablet by mouth daily.    Marland Kitchen letrozole (FEMARA) 2.5 MG tablet Take 1 tablet (2.5 mg total) by mouth daily. 30 tablet 11  . predniSONE (DELTASONE) 10 MG tablet 6 tablets on Day 1 , then reduce by 1 tablet daily until gone 21 tablet 0  . propranolol (INDERAL) 20 MG tablet TAKE 1 TABLET BY MOUTH TWICE DAILY 60 tablet 4  . SYNTHROID 88 MCG tablet TAKE 1 TABLET BY MOUTH EVERY DAY 30 tablet 1   No facility-administered medications prior to visit.    Review of Systems   Patient denies headache, fevers, malaise, unintentional weight loss, skin rash, eye pain, sinus congestion and sinus pain, sore throat, dysphagia,  hemoptysis , cough, dyspnea, wheezing, chest pain, palpitations, orthopnea, edema, abdominal pain, nausea, melena, diarrhea, constipation, flank pain, dysuria, hematuria, urinary  Frequency, nocturia, numbness, tingling, seizures,  Focal weakness, Loss of consciousness,  Tremor, insomnia, depression, anxiety, and suicidal ideation.      Objective:  BP 118/64 mmHg  Pulse 81  Temp(Src) 98.4 F (36.9 C) (Oral)  Resp 12  Ht 5' 7.25" (1.708 m)  Wt 162 lb (73.483 kg)  BMI 25.19 kg/m2  SpO2 98%  Physical  Exam   General appearance: alert, cooperative and appears stated age Head: Normocephalic, without obvious abnormality, atraumatic Eyes: conjunctivae/corneas clear. PERRL, EOM's intact. Fundi benign. Ears: normal TM's and external ear canals both ears Nose: Nares normal. Septum midline. Mucosa normal. No drainage or sinus tenderness. Throat: lips, mucosa, and tongue normal; teeth and gums normal Neck: no adenopathy, no carotid bruit, no JVD, supple, symmetrical, trachea midline and thyroid not enlarged, symmetric, no tenderness/mass/nodules Lungs: clear to auscultation bilaterally Breasts: right mastectomy, well healed.  Left breast normal appearance, no masses or tenderness Heart: regular rate and rhythm, S1, S2 normal, no murmur, click, rub or gallop Abdomen: soft, non-tender; bowel sounds normal; no masses,  no organomegaly Extremities: extremities normal, atraumatic, no cyanosis or edema Pulses: 2+ and symmetric Skin: Skin color, texture, turgor normal. No rashes or lesions Neurologic: Alert and oriented X 3, normal strength and tone. Normal symmetric reflexes. Normal coordination and gait.     Assessment & Plan:   Problem List Items Addressed This Visit    Benign essential tremor - Primary    Tremor is worsening ,  Affecting right hand greater than left.  .  She has not seen Tiffany Chang in 2 years.  Relevant Orders   Ambulatory referral to Neurology   Medicare annual wellness visit, subsequent    Annual comprehensive preventive exam was done as well as an evaluation and management of chronic conditions .  During the course of the visit the patient was educated and counseled about appropriate screening and preventive services including :  diabetes screening, lipid analysis with projected  10 year  risk for CAD , nutrition counseling, breast, cervical and colorectal cancer screening, and recommended immunizations.  Printed recommendations for health maintenance screenings was  give      Osteopenia    Discussed the current controversies surrounding the risks and benefits of calcium supplementation.  Encouraged her to increase dietary calcium through natural foods including almond/coconut milk      Cerumen impaction   Hyperlipidemia   Relevant Orders   Lipid panel   Vitamin D deficiency   Relevant Orders   VITAMIN D 25 Hydroxy (Vit-D Deficiency, Fractures)    Other Visit Diagnoses    Other fatigue        Relevant Orders    Comprehensive metabolic panel    TSH    CBC with Differential/Platelet    Other problems related to lifestyle        Relevant Orders    Hepatitis C antibody    HIV antibody       I am having Ms. Reining maintain her aspirin EC, Vitamin D3, letrozole, amoxicillin-clavulanate, predniSONE, propranolol, and SYNTHROID.  No orders of the defined types were placed in this encounter.    There are no discontinued medications.  Follow-up: Return in about 1 year (around 07/24/2016) for fasting labs needed asap.   Crecencio Mc, MD

## 2015-07-25 NOTE — Assessment & Plan Note (Signed)
Discussed the current controversies surrounding the risks and benefits of calcium supplementation.  Encouraged her to increase dietary calcium through natural foods including almond/coconut milk 

## 2015-07-26 ENCOUNTER — Encounter: Payer: Self-pay | Admitting: Internal Medicine

## 2015-07-26 NOTE — Assessment & Plan Note (Signed)
Annual comprehensive preventive exam was done as well as an evaluation and management of chronic conditions .  During the course of the visit the patient was educated and counseled about appropriate screening and preventive services including :  diabetes screening, lipid analysis with projected  10 year  risk for CAD , nutrition counseling, breast, cervical and colorectal cancer screening, and recommended immunizations.  Printed recommendations for health maintenance screenings was give 

## 2015-07-27 ENCOUNTER — Other Ambulatory Visit: Payer: Self-pay | Admitting: Internal Medicine

## 2015-07-27 DIAGNOSIS — H2513 Age-related nuclear cataract, bilateral: Secondary | ICD-10-CM | POA: Diagnosis not present

## 2015-07-27 DIAGNOSIS — Z853 Personal history of malignant neoplasm of breast: Secondary | ICD-10-CM

## 2015-07-30 NOTE — Telephone Encounter (Signed)
Patient had Mastectomy on the right breast why we [place]?

## 2015-07-31 ENCOUNTER — Encounter: Payer: Medicare Other | Admitting: Internal Medicine

## 2015-08-01 ENCOUNTER — Ambulatory Visit: Payer: Medicare Other | Admitting: Oncology

## 2015-08-06 ENCOUNTER — Other Ambulatory Visit (INDEPENDENT_AMBULATORY_CARE_PROVIDER_SITE_OTHER): Payer: Medicare Other

## 2015-08-06 ENCOUNTER — Other Ambulatory Visit: Payer: Self-pay | Admitting: Internal Medicine

## 2015-08-06 DIAGNOSIS — E785 Hyperlipidemia, unspecified: Secondary | ICD-10-CM | POA: Diagnosis not present

## 2015-08-06 DIAGNOSIS — R5383 Other fatigue: Secondary | ICD-10-CM | POA: Diagnosis not present

## 2015-08-06 DIAGNOSIS — E559 Vitamin D deficiency, unspecified: Secondary | ICD-10-CM | POA: Diagnosis not present

## 2015-08-06 DIAGNOSIS — Z7289 Other problems related to lifestyle: Secondary | ICD-10-CM | POA: Diagnosis not present

## 2015-08-06 DIAGNOSIS — C50411 Malignant neoplasm of upper-outer quadrant of right female breast: Secondary | ICD-10-CM

## 2015-08-06 LAB — CBC WITH DIFFERENTIAL/PLATELET
Basophils Absolute: 0 10*3/uL (ref 0.0–0.1)
Basophils Relative: 0.6 % (ref 0.0–3.0)
Eosinophils Absolute: 0.2 10*3/uL (ref 0.0–0.7)
Eosinophils Relative: 2.8 % (ref 0.0–5.0)
HCT: 42.1 % (ref 36.0–46.0)
Hemoglobin: 14.3 g/dL (ref 12.0–15.0)
Lymphocytes Relative: 24.2 % (ref 12.0–46.0)
Lymphs Abs: 1.5 10*3/uL (ref 0.7–4.0)
MCHC: 33.9 g/dL (ref 30.0–36.0)
MCV: 86.2 fl (ref 78.0–100.0)
Monocytes Absolute: 0.3 10*3/uL (ref 0.1–1.0)
Monocytes Relative: 5.3 % (ref 3.0–12.0)
Neutro Abs: 4 10*3/uL (ref 1.4–7.7)
Neutrophils Relative %: 67.1 % (ref 43.0–77.0)
Platelets: 320 10*3/uL (ref 150.0–400.0)
RBC: 4.89 Mil/uL (ref 3.87–5.11)
RDW: 13.8 % (ref 11.5–15.5)
WBC: 6 10*3/uL (ref 4.0–10.5)

## 2015-08-06 LAB — HIV ANTIBODY (ROUTINE TESTING W REFLEX): HIV 1&2 Ab, 4th Generation: NONREACTIVE

## 2015-08-06 LAB — LIPID PANEL
Cholesterol: 271 mg/dL — ABNORMAL HIGH (ref 0–200)
HDL: 38.4 mg/dL — ABNORMAL LOW (ref >39.00)
NonHDL: 232.96
Total CHOL/HDL Ratio: 7
Triglycerides: 201 mg/dL — ABNORMAL HIGH (ref 0.0–149.0)
VLDL: 40.2 mg/dL — ABNORMAL HIGH (ref 0.0–40.0)

## 2015-08-06 LAB — COMPREHENSIVE METABOLIC PANEL
ALT: 11 U/L (ref 0–35)
AST: 14 U/L (ref 0–37)
Albumin: 4.2 g/dL (ref 3.5–5.2)
Alkaline Phosphatase: 82 U/L (ref 39–117)
BUN: 16 mg/dL (ref 6–23)
CO2: 30 mEq/L (ref 19–32)
Calcium: 9.8 mg/dL (ref 8.4–10.5)
Chloride: 102 mEq/L (ref 96–112)
Creatinine, Ser: 0.72 mg/dL (ref 0.40–1.20)
GFR: 85.74 mL/min (ref >60.00)
Glucose, Bld: 92 mg/dL (ref 70–99)
Potassium: 4.7 mEq/L (ref 3.5–5.1)
Sodium: 138 mEq/L (ref 135–145)
Total Bilirubin: 0.4 mg/dL (ref 0.2–1.2)
Total Protein: 7.1 g/dL (ref 6.0–8.3)

## 2015-08-06 LAB — TSH: TSH: 0.74 u[IU]/mL (ref 0.35–4.50)

## 2015-08-06 LAB — LDL CHOLESTEROL, DIRECT: Direct LDL: 193 mg/dL

## 2015-08-07 LAB — VITAMIN D 25 HYDROXY (VIT D DEFICIENCY, FRACTURES): VITD: 27.62 ng/mL — ABNORMAL LOW (ref 30.00–100.00)

## 2015-08-07 LAB — HEPATITIS C ANTIBODY: HCV Ab: NEGATIVE

## 2015-08-08 ENCOUNTER — Encounter: Payer: Self-pay | Admitting: Internal Medicine

## 2015-08-08 ENCOUNTER — Inpatient Hospital Stay: Payer: Medicare Other | Attending: Oncology | Admitting: Oncology

## 2015-08-08 VITALS — BP 128/85 | HR 69 | Temp 97.7°F | Resp 18 | Ht 67.25 in | Wt 163.5 lb

## 2015-08-08 DIAGNOSIS — E063 Autoimmune thyroiditis: Secondary | ICD-10-CM | POA: Diagnosis not present

## 2015-08-08 DIAGNOSIS — E039 Hypothyroidism, unspecified: Secondary | ICD-10-CM | POA: Diagnosis not present

## 2015-08-08 DIAGNOSIS — Z170 Estrogen receptor positive status [ER+]: Secondary | ICD-10-CM | POA: Insufficient documentation

## 2015-08-08 DIAGNOSIS — E785 Hyperlipidemia, unspecified: Secondary | ICD-10-CM | POA: Insufficient documentation

## 2015-08-08 DIAGNOSIS — Z9011 Acquired absence of right breast and nipple: Secondary | ICD-10-CM | POA: Diagnosis not present

## 2015-08-08 DIAGNOSIS — C50911 Malignant neoplasm of unspecified site of right female breast: Secondary | ICD-10-CM | POA: Insufficient documentation

## 2015-08-08 DIAGNOSIS — Z79811 Long term (current) use of aromatase inhibitors: Secondary | ICD-10-CM | POA: Insufficient documentation

## 2015-08-08 DIAGNOSIS — M4800 Spinal stenosis, site unspecified: Secondary | ICD-10-CM | POA: Insufficient documentation

## 2015-08-08 DIAGNOSIS — Z7982 Long term (current) use of aspirin: Secondary | ICD-10-CM | POA: Insufficient documentation

## 2015-08-08 DIAGNOSIS — M8580 Other specified disorders of bone density and structure, unspecified site: Secondary | ICD-10-CM | POA: Diagnosis not present

## 2015-08-08 NOTE — Progress Notes (Signed)
No changes since last visit.  Mammogram tomorrow per Pt

## 2015-08-09 ENCOUNTER — Ambulatory Visit
Admission: RE | Admit: 2015-08-09 | Discharge: 2015-08-09 | Disposition: A | Discharge status: Home / Self Care | Payer: Medicare Other | Source: Ambulatory Visit | Attending: Internal Medicine | Admitting: Internal Medicine

## 2015-08-09 ENCOUNTER — Other Ambulatory Visit: Payer: Self-pay | Admitting: Internal Medicine

## 2015-08-09 DIAGNOSIS — C50411 Malignant neoplasm of upper-outer quadrant of right female breast: Secondary | ICD-10-CM | POA: Diagnosis not present

## 2015-08-09 DIAGNOSIS — Z853 Personal history of malignant neoplasm of breast: Secondary | ICD-10-CM | POA: Insufficient documentation

## 2015-08-09 DIAGNOSIS — N6489 Other specified disorders of breast: Secondary | ICD-10-CM | POA: Diagnosis not present

## 2015-08-09 DIAGNOSIS — R928 Other abnormal and inconclusive findings on diagnostic imaging of breast: Secondary | ICD-10-CM | POA: Diagnosis not present

## 2015-08-09 DIAGNOSIS — N63 Unspecified lump in unspecified breast: Secondary | ICD-10-CM

## 2015-08-09 HISTORY — DX: Malignant neoplasm of unspecified site of unspecified female breast: C50.919

## 2015-08-10 ENCOUNTER — Encounter: Payer: Self-pay | Admitting: Internal Medicine

## 2015-08-14 ENCOUNTER — Ambulatory Visit (INDEPENDENT_AMBULATORY_CARE_PROVIDER_SITE_OTHER): Payer: Medicare Other | Admitting: Neurology

## 2015-08-14 ENCOUNTER — Encounter: Payer: Self-pay | Admitting: Neurology

## 2015-08-14 VITALS — BP 122/70 | HR 92 | Ht 67.5 in | Wt 162.0 lb

## 2015-08-14 DIAGNOSIS — G250 Essential tremor: Secondary | ICD-10-CM | POA: Diagnosis not present

## 2015-08-14 MED ORDER — PROPRANOLOL HCL ER 60 MG PO CP24: 60.0000 mg | ORAL_CAPSULE | Freq: Every day | ORAL | Status: DC

## 2015-08-14 NOTE — Patient Instructions (Signed)
1. Start Inderal LA - 60 mg tablets to take one tablet daily. We will call in a month to see how you are doing. This may need to be increased. Pay attention to your blood pressure and let us know if it runs low.

## 2015-08-14 NOTE — Progress Notes (Signed)
Subjective:    Tiffany Chang was seen in consultation in the movement disorder clinic at the request of TULLO, Aris Everts, MD.  The evaluation is for tremor.  She was previously seen by Dr. Loletta Specter at the [facility] in 2009.  I do have a note from him from May, 2009.  The patient is a 70 y.o. right handed female with a history of tremor.  Tremor began in approximately 9-10 years ago.  She first noted it in her R hand after a long trip to [place].  It is present with activation.  She went to Dr. Loletta Specter and was dx with ET.  She was started on propranolol 20 mg bid and it helped.  It is still helping and it hasn't really progressing.  The tremor is mainly in her R hand.  No tremor in the head or the legs.  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  There is a family hx of tremor due to PD in paternal grandmother and uncle.  Her balance is good.    Affected by caffeine:  no Affected by alcohol:  no Affected by stress:  no Affected by fatigue:  no Spills soup if on spoon:  no but she can notice the shake and has to concentrate if has peas on a spoon Spills glass of liquid if full:  no Affects ADL's (tying shoes, brushing teeth, etc):  no  Current/Previously tried tremor medications: propranolol  Current medications that may exacerbate tremor:  N/a  08/14/15 update:  Pt returns for f/u, accompanied by her husband who supplements the history.  Haven't seen pt in 2 years.  Pt is still on inderal 20 mg bid for tremor.  Reports that she had breast CA since last visit but did good in that regard.  Had mastectomy only.  Had recent mammogram and going to have biopsy on lesion soon.   Tremor has been slowly progressing. Husband states that she is avoiding situations more, and eating more with the L hand.  She is not spilling food.  She has trouble writing.  No trouble with brushing teeth but does shake when she does that.  No speech trouble.  Able to cook without trouble.  Thinks that  the propranolol helps but doesn't know.  R hand is more tremulous than the left.  No tremor in the legs.     Outside reports reviewed: historical medical records, office notes and referral letter/letters.  Allergies  Allergen Reactions  . Statins Other (See Comments)    Reaction:  Leg cramps   . Bee Venom Hives    Current Outpatient Prescriptions on File Prior to Visit  Medication Sig Dispense Refill  . aspirin EC 81 MG tablet Take 81 mg by mouth daily.    . Cholecalciferol (VITAMIN D3) 2000 UNITS TABS Take 1 tablet by mouth daily.    Marland Kitchen letrozole (FEMARA) 2.5 MG tablet Take 1 tablet (2.5 mg total) by mouth daily. 30 tablet 11  . propranolol (INDERAL) 20 MG tablet TAKE 1 TABLET BY MOUTH TWICE DAILY 60 tablet 4  . SYNTHROID 88 MCG tablet TAKE 1 TABLET BY MOUTH EVERY DAY 30 tablet 1   No current facility-administered medications on file prior to visit.    Past Medical History  Diagnosis Date  . History of arthroscopy of left knee 1985  . Spinal stenosis     mild  . Hyperlipidemia     mild, with prior statin intolerance  . Osteoporosis  prior Evista use x 5 yrs , 2007  . Hypothyroid   . Tremor   . Viral meningitis     70 years old  . Hashimoto's thyroiditis   . Breast cancer ([place]) 2016    [facility] - RT MASTECTOMY    Past Surgical History  Procedure Laterality Date  . Foot surgery      plantar fascitis, Dr. Elvina Mattes  . Knee arthroscopy  1985    right  . Cholecystectomy  2006  . Tonsillectomy    . Cesarean section    . Simple mastectomy with axillary sentinel node biopsy Right 10/10/2014    Procedure: SIMPLE MASTECTOMY;  Surgeon: Leonie Green, MD;  Location: [facility] ORS;  Service: General;  Laterality: Right;  . Sentinel node biopsy Right 10/10/2014    Procedure: SENTINEL NODE BIOPSY;  Surgeon: Leonie Green, MD;  Location: [facility] ORS;  Service: General;  Laterality: Right;  . Mastectomy Right 2016    [place]    Social History   Social History  . Marital Status:  Married    Spouse Name: N/A  . Number of Children: N/A  . Years of Education: N/A   Occupational History  .      exercise physiologist   Social History Main Topics  . Smoking status: Never Smoker   . Smokeless tobacco: Never Used  . Alcohol Use: 1.5 oz/week    3 drink(s) per week     Comment: social, 2 times per week  . Drug Use: No  . Sexual Activity: Not on file   Other Topics Concern  . Not on file   Social History Narrative    Family Status  Relation Status Death Age  . Mother Alive     htn, has pacemaker  . Father Deceased 67    AAA  . Sister Alive     breast cancer  . Sister Alive   . Sister Alive   . Son Alive     melanoma  . Daughter Alive     Review of Systems Chronic constipation with hemmorhoid.  A complete 10 system ROS was obtained and was negative apart from what is mentioned.   Objective:   VITALS:   Filed Vitals:   08/14/15 1506  BP: 122/70  Pulse: 92  Height: 5' 7.5" (1.715 m)  Weight: 162 lb (73.483 kg)   Gen:  Appears stated age and in NAD. HEENT:  Normocephalic, atraumatic. The mucous membranes are moist. The superficial temporal arteries are without ropiness or tenderness. Cardiovascular: Regular rate and rhythm. Lungs: Clear to auscultation bilaterally. Neck: There are no carotid bruits noted bilaterally.  NEUROLOGICAL:  Orientation:  The patient is alert and oriented x 3.  Recent and remote memory are intact.  Attention span and concentration are normal.  Able to name objects and repeat without trouble.  Fund of knowledge is appropriate Cranial nerves: There is good facial symmetry.  Extraocular muscles are intact and visual fields are full to confrontational testing. Speech is fluent and clear. Soft palate rises symmetrically and there is no tongue deviation. Hearing is intact to conversational tone. Tone: Tone is good throughout. Sensation: Sensation is intact to light touch  Coordination:  The patient has no dysdiadichokinesia  or dysmetria. Motor: Strength is 5/5 in the bilateral upper and lower extremities.  Shoulder shrug is equal bilaterally.  There is no pronator drift.  There are no fasciculations noted. DTR's: Deep tendon reflexes are 2/4 at the bilateral biceps, triceps, brachioradialis, patella and achilles.  Plantar responses are downgoing bilaterally. Gait and Station: The patient is able to ambulate without difficulty. The patient is able to heel toe walk without any difficulty. The patient is able to ambulate in a tandem fashion. The patient is able to stand in the Romberg position.   MOVEMENT EXAM: Tremor:  There is mod tremor when the R arm is held proximally.  Tremor is mild-mod of the L arm when held proximally.  She does have some difficulty with Archimedes spirals, right more than left.  She does still water when asked to pour a full glass of water from one glass to another, when the full glass is in the right hand.     Assessment/Plan:   1.  Essential tremor  -Long discussion with the patient today.  She would like to increase the propranolol.  We will change [place] to Inderal LA, 60 mg daily.  This may not be enough.  We will call her in a month and make sure that she is feeling okay.  If tremor is not well controlled, we can always increase the dose or change to primidone, or add primidone.  We discussed these options today.  She asked me about gamma knife therapy.  I told her that this would not be standard of care therapy in terms of surgical options for tremor.  We talked about DBS therapy in detail, as well as the risks and benefits of this.  We also talked about focused ultrasound, although I think this is second line to DBS therapy.  Much greater than 50% of this 35 minute visit was spent in counseling with the patient and her husband.  I will plan on seeing her back in the next few months, but would be happy to see her back earlier, if necessary.

## 2015-08-20 ENCOUNTER — Other Ambulatory Visit: Payer: Self-pay | Admitting: Internal Medicine

## 2015-08-20 NOTE — Progress Notes (Signed)
[facility]  [phone] [phone]  ID: Kathryne Perry Trolinger OB: 05/30/1945  MR#: [id number]  CSN#:[id number]  Patient Care Team: Teresa L Tullo, MD as PCP - General (Internal Medicine)  CHIEF COMPLAINT:  Chief Complaint  Patient presents with  . Follow-up    Breast     INTERVAL HISTORY: Patient returns to clinic today for further evaluation and routine 6 month follow-up. She is tolerating letrozole well without significant side effects. She currently feels well and is asymptomatic. She has no neurologic complaints. She denies any fevers. She has a good appetite and denies weight loss. She has no chest pain or shortness of breath. She denies any nausea, vomiting, constipation, or diarrhea. She has no urinary complaints. Patient offers no specific complaints today.  REVIEW OF SYSTEMS:   Review of Systems  Constitutional: Negative.  Negative for fever, weight loss and malaise/fatigue.  Respiratory: Negative.  Negative for shortness of breath.   Cardiovascular: Negative.  Negative for chest pain.  Gastrointestinal: Negative.   Musculoskeletal: Negative.   Neurological: Negative.  Negative for sensory change.    As per HPI. Otherwise, a complete review of systems is negatve.  PAST MEDICAL HISTORY: Past Medical History  Diagnosis Date  . History of arthroscopy of left knee 1985  . Spinal stenosis     mild  . Hyperlipidemia     mild, with prior statin intolerance  . Osteoporosis     prior Evista use x 5 yrs , 2007  . Hypothyroid   . Tremor   . Viral meningitis     70 years old  . Hashimoto's thyroiditis   . Breast cancer ([facility]) 2016    [facility] - RT MASTECTOMY    PAST SURGICAL HISTORY: Past Surgical History  Procedure Laterality Date  . Foot surgery      plantar fascitis, Dr. Troxler  . Knee arthroscopy  1985    right  . Cholecystectomy  2006  . Tonsillectomy    . Cesarean section    . Simple mastectomy with axillary sentinel node biopsy  Right 10/10/2014    Procedure: SIMPLE MASTECTOMY;  Surgeon: Jarvis Wilton Smith, MD;  Location: [facility] ORS;  Service: General;  Laterality: Right;  . Sentinel node biopsy Right 10/10/2014    Procedure: SENTINEL NODE BIOPSY;  Surgeon: Jarvis Wilton Smith, MD;  Location: [facility] ORS;  Service: General;  Laterality: Right;  . Mastectomy Right 2016    [facility]    FAMILY HISTORY Family History  Problem Relation Age of Onset  . Osteoporosis Mother   . Cancer Sister 55    Breast  . Breast cancer Sister 55  . Cancer Maternal Grandfather     breast  . Aortic aneurysm Father   . Breast cancer Paternal Grandmother 86  . Breast cancer Maternal Grandmother 86       ADVANCED DIRECTIVES:    HEALTH MAINTENANCE: Social History  Substance Use Topics  . Smoking status: Never Smoker   . Smokeless tobacco: Never Used  . Alcohol Use: 1.5 oz/week    3 drink(s) per week     Comment: social, 2 times per week     Colonoscopy:  PAP:  Bone density:  Lipid panel:  Allergies  Allergen Reactions  . Statins Other (See Comments)    Reaction:  Leg cramps   . Bee Venom Hives    Current Outpatient Prescriptions  Medication Sig Dispense Refill  . aspirin EC 81 MG tablet Take 81 mg by mouth daily.    .   Cholecalciferol (VITAMIN D3) 2000 UNITS TABS Take 1 tablet by mouth daily.    . letrozole (FEMARA) 2.5 MG tablet Take 1 tablet (2.5 mg total) by mouth daily. 30 tablet 11  . propranolol ER (INDERAL LA) 60 MG 24 hr capsule Take 1 capsule (60 mg total) by mouth daily. 30 capsule 2  . SYNTHROID 88 MCG tablet TAKE 1 TABLET BY MOUTH EVERY DAY 30 tablet 5   No current facility-administered medications for this visit.    OBJECTIVE: Filed Vitals:   08/08/15 1126  BP: 128/85  Pulse: 69  Temp: 97.7 F (36.5 C)  Resp: 18     Body mass index is 25.42 kg/(m^2).    ECOG FS:0 - Asymptomatic  General: Well-developed, well-nourished, no acute distress. Eyes: Anicteric sclera. Breasts: Exam deferred today. Lungs:  Clear to auscultation bilaterally. Heart: Regular rate and rhythm. No rubs, murmurs, or gallops. Abdomen: Soft, nontender, nondistended. No organomegaly noted, normoactive bowel sounds. Musculoskeletal: No edema, cyanosis, or clubbing. Neuro: Alert, answering all questions appropriately. Cranial nerves grossly intact. Skin: No rashes or petechiae noted. Psych: Normal affect.   LAB RESULTS:  Lab Results  Component Value Date   NA 138 08/06/2015   K 4.7 08/06/2015   CL 102 08/06/2015   CO2 30 08/06/2015   GLUCOSE 92 08/06/2015   BUN 16 08/06/2015   CREATININE 0.72 08/06/2015   CALCIUM 9.8 08/06/2015   PROT 7.1 08/06/2015   ALBUMIN 4.2 08/06/2015   AST 14 08/06/2015   ALT 11 08/06/2015   ALKPHOS 82 08/06/2015   BILITOT 0.4 08/06/2015   GFRNONAA >60 10/03/2014   GFRAA >60 10/03/2014    Lab Results  Component Value Date   WBC 6.0 08/06/2015   NEUTROABS 4.0 08/06/2015   HGB 14.3 08/06/2015   HCT 42.1 08/06/2015   MCV 86.2 08/06/2015   PLT 320.0 08/06/2015     STUDIES: Us Breast Ltd Uni Left Inc Axilla  08/09/2015  CLINICAL DATA:  70-year-old female with history of right breast cancer status post mastectomy in 2016. The patient reports that her referring physician felt an abnormality within the superior left breast at the time of recent exam. The patient does not feel any lumps herself. EXAM: 2D DIGITAL DIAGNOSTIC LEFT MAMMOGRAM WITH CAD AND ADJUNCT TOMO ULTRASOUND LEFT BREAST COMPARISON:  Previous exam(s). ACR Breast Density Category c: The breast tissue is heterogeneously dense, which may obscure small masses. FINDINGS: No suspicious mass, calcifications, or other abnormality is identified within the left breast. Mammographic images were processed with CAD. On physical exam, no discrete mass is felt in the area of concern within the superior left breast. Targeted ultrasound is performed, showing an oval, circumscribed, hypoechoic mass at 3 o'clock, 2-3 cm from the nipple  measuring 6 x 9 x 4 mm. An additional oval, hypoechoic mass is seen at 1 o'clock, 4 cm from the nipple with slightly irregular margins measuring 4 x 5 x 3 mm. These findings are thought to both be incidental. Targeted ultrasound of the left axilla demonstrates no suspicious appearing axillary lymph nodes. IMPRESSION: Indeterminate left breast mass at 1 o'clock, 4 cm from the nipple. Additional left breast mass at 3 o'clock, 2-3 cm from the nipple. RECOMMENDATION: Ultrasound-guided biopsies of the left breast masses at 1 o'clock, 4 cm from the nipple and 3 o'clock, 2-3 cm from the nipple. I have discussed the findings and recommendations with the patient. Results were also provided in writing at the conclusion of the visit. If applicable, a reminder letter will   be sent to the patient regarding the next appointment. BI-RADS CATEGORY  4: Suspicious. Electronically Signed   By: Erin  Shaw M.D.   On: 08/09/2015 12:17   Mm Diag Breast Tomo Uni Left  08/09/2015  CLINICAL DATA:  70-year-old female with history of right breast cancer status post mastectomy in 2016. The patient reports that her referring physician felt an abnormality within the superior left breast at the time of recent exam. The patient does not feel any lumps herself. EXAM: 2D DIGITAL DIAGNOSTIC LEFT MAMMOGRAM WITH CAD AND ADJUNCT TOMO ULTRASOUND LEFT BREAST COMPARISON:  Previous exam(s). ACR Breast Density Category c: The breast tissue is heterogeneously dense, which may obscure small masses. FINDINGS: No suspicious mass, calcifications, or other abnormality is identified within the left breast. Mammographic images were processed with CAD. On physical exam, no discrete mass is felt in the area of concern within the superior left breast. Targeted ultrasound is performed, showing an oval, circumscribed, hypoechoic mass at 3 o'clock, 2-3 cm from the nipple measuring 6 x 9 x 4 mm. An additional oval, hypoechoic mass is seen at 1 o'clock, 4 cm from the  nipple with slightly irregular margins measuring 4 x 5 x 3 mm. These findings are thought to both be incidental. Targeted ultrasound of the left axilla demonstrates no suspicious appearing axillary lymph nodes. IMPRESSION: Indeterminate left breast mass at 1 o'clock, 4 cm from the nipple. Additional left breast mass at 3 o'clock, 2-3 cm from the nipple. RECOMMENDATION: Ultrasound-guided biopsies of the left breast masses at 1 o'clock, 4 cm from the nipple and 3 o'clock, 2-3 cm from the nipple. I have discussed the findings and recommendations with the patient. Results were also provided in writing at the conclusion of the visit. If applicable, a reminder letter will be sent to the patient regarding the next appointment. BI-RADS CATEGORY  4: Suspicious. Electronically Signed   By: Erin  Shaw M.D.   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  PLAN:    1. Breast cancer: Patient ultimately decided to have a full mastectomy therefore she did not require adjuvant XRT. Given the fact that her malignancy is lobular in nature, she does not require chemotherapy at this time. Continue letrozole for 5 years completing in June 2021. No further intervention is needed at this time. Return to clinic in 6 months with repeat laboratory work and further evaluation.  2. Osteopenia: Bone mineral density in June 2016 revealed a T score of -1.7. In June 2017. 3. Right mammogram: Results reviewed independently reporting BI-RADS 4, patient will likely require biopsy for further evaluation.   Patient expressed understanding and was in agreement with this plan. She also understands that She can call clinic at any time with any questions, concerns, or complaints.    Timothy J Finnegan, MD   08/20/2015 4:44 PM     

## 2015-08-21 ENCOUNTER — Other Ambulatory Visit: Payer: Self-pay | Admitting: *Deleted

## 2015-08-21 ENCOUNTER — Ambulatory Visit
Admission: RE | Admit: 2015-08-21 | Discharge: 2015-08-21 | Disposition: A | Discharge status: Home / Self Care | Payer: Medicare Other | Source: Ambulatory Visit | Attending: Internal Medicine | Admitting: Internal Medicine

## 2015-08-21 DIAGNOSIS — N63 Unspecified lump in unspecified breast: Secondary | ICD-10-CM

## 2015-08-21 DIAGNOSIS — D242 Benign neoplasm of left breast: Secondary | ICD-10-CM | POA: Insufficient documentation

## 2015-08-21 DIAGNOSIS — N6320 Unspecified lump in the left breast, unspecified quadrant: Secondary | ICD-10-CM

## 2015-08-22 LAB — SURGICAL PATHOLOGY

## 2015-08-23 ENCOUNTER — Encounter: Payer: Self-pay | Admitting: Internal Medicine

## 2015-10-08 ENCOUNTER — Other Ambulatory Visit: Payer: Self-pay

## 2015-10-08 DIAGNOSIS — C50911 Malignant neoplasm of unspecified site of right female breast: Secondary | ICD-10-CM

## 2015-10-08 MED ORDER — LETROZOLE 2.5 MG PO TABS: 2.5000 mg | ORAL_TABLET | Freq: Every day | ORAL | Status: DC

## 2015-10-09 ENCOUNTER — Other Ambulatory Visit: Payer: Self-pay | Admitting: Neurology

## 2015-10-09 NOTE — Telephone Encounter (Signed)
Left message on machine for patient to call back. To see how she is doing on this dose of medication before we refill it.

## 2015-10-10 DIAGNOSIS — H6123 Impacted cerumen, bilateral: Secondary | ICD-10-CM | POA: Diagnosis not present

## 2015-10-10 DIAGNOSIS — H900 Conductive hearing loss, bilateral: Secondary | ICD-10-CM | POA: Diagnosis not present

## 2015-10-10 NOTE — Telephone Encounter (Signed)
One month supply sent

## 2015-10-23 ENCOUNTER — Encounter: Payer: Self-pay | Admitting: Internal Medicine

## 2015-10-23 ENCOUNTER — Ambulatory Visit
Admission: RE | Admit: 2015-10-23 | Discharge: 2015-10-23 | Disposition: A | Discharge status: Home / Self Care | Payer: Medicare Other | Source: Ambulatory Visit | Attending: Oncology | Admitting: Oncology

## 2015-10-23 DIAGNOSIS — C50911 Malignant neoplasm of unspecified site of right female breast: Secondary | ICD-10-CM

## 2015-10-23 DIAGNOSIS — M8588 Other specified disorders of bone density and structure, other site: Secondary | ICD-10-CM | POA: Diagnosis not present

## 2015-10-23 DIAGNOSIS — Z780 Asymptomatic menopausal state: Secondary | ICD-10-CM | POA: Diagnosis not present

## 2015-10-23 DIAGNOSIS — Z13820 Encounter for screening for osteoporosis: Secondary | ICD-10-CM | POA: Insufficient documentation

## 2015-11-06 NOTE — Telephone Encounter (Signed)
Mailed unread message to patient. thanks 

## 2015-11-15 ENCOUNTER — Ambulatory Visit: Payer: Medicare Other | Admitting: Neurology

## 2015-11-26 ENCOUNTER — Ambulatory Visit (INDEPENDENT_AMBULATORY_CARE_PROVIDER_SITE_OTHER): Payer: Medicare Other | Admitting: Neurology

## 2015-11-26 ENCOUNTER — Encounter: Payer: Self-pay | Admitting: Neurology

## 2015-11-26 VITALS — BP 110/68 | HR 90 | Ht 67.5 in | Wt 164.0 lb

## 2015-11-26 DIAGNOSIS — G250 Essential tremor: Secondary | ICD-10-CM | POA: Diagnosis not present

## 2015-11-26 MED ORDER — PROPRANOLOL HCL 40 MG PO TABS: 40.0000 mg | ORAL_TABLET | Freq: Two times a day (BID) | ORAL | Status: DC

## 2015-11-26 NOTE — Patient Instructions (Signed)
1.  Increase propranolol to 40 mg twice per day 2.  See you in 3 months 3.  Let me know if you have any problems

## 2015-11-26 NOTE — Progress Notes (Signed)
Subjective:    Tiffany Chang was seen in consultation in the movement disorder clinic at the request of TULLO, Tiffany Everts, MD.  The evaluation is for tremor.  She was previously seen by Dr. Loletta Chang at the [facility] in 2009.  I do have a note from him from May, 2009.  The patient is a 70 y.o. right handed female with a history of tremor.  Tremor began in approximately 9-10 years ago.  She first noted it in her R hand after a long trip to [place].  It is present with activation.  She went to Dr. Loletta Chang and was dx with ET.  She was started on propranolol 20 mg bid and it helped.  It is still helping and it hasn't really progressing.  The tremor is mainly in her R hand.  No tremor in the head or the legs.  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  There is a family hx of tremor due to PD in paternal grandmother and uncle.  Her balance is good.    Affected by caffeine:  no Affected by alcohol:  no Affected by stress:  no Affected by fatigue:  no Spills soup if on spoon:  no but she can notice the shake and has to concentrate if has peas on a spoon Spills glass of liquid if full:  no Affects ADL's (tying shoes, brushing teeth, etc):  no  Current/Previously tried tremor medications: propranolol  Current medications that may exacerbate tremor:  N/a  08/14/15 update:  Pt returns for f/u, accompanied by her husband who supplements the history.  Haven't seen pt in 2 years.  Pt is still on inderal 20 mg bid for tremor.  Reports that she had breast CA since last visit but did good in that regard.  Had mastectomy only.  Had recent mammogram and going to have biopsy on lesion soon.   Tremor has been slowly progressing. Husband states that she is avoiding situations more, and eating more with the L hand.  She is not spilling food.  She has trouble writing.  No trouble with brushing teeth but does shake when she does that.  No speech trouble.  Able to cook without trouble.  Thinks that  the propranolol helps but doesn't know.  R hand is more tremulous than the left.  No tremor in the legs.     11/26/15 update:  The patient is following up today regarding essential tremor.  Last visit, her propranolol was changed from 20 mg twice a day to Inderal LA, 60 mg daily.  She is doing about the same but no SE with the medication.  No new medical problems.    Outside reports reviewed: historical medical records, office notes and referral letter/letters.  Allergies  Allergen Reactions  . Statins Other (See Comments)    Reaction:  Leg cramps   . Bee Venom Hives    Current Outpatient Prescriptions on File Prior to Visit  Medication Sig Dispense Refill  . aspirin EC 81 MG tablet Take 81 mg by mouth daily.    . Cholecalciferol (VITAMIN D3) 2000 UNITS TABS Take 1 tablet by mouth daily.    Marland Kitchen letrozole (FEMARA) 2.5 MG tablet Take 1 tablet (2.5 mg total) by mouth daily. 30 tablet 3  . propranolol ER (INDERAL LA) 60 MG 24 hr capsule TAKE 1 CAPSULE(60 MG) BY MOUTH DAILY 30 capsule 0  . SYNTHROID 88 MCG tablet TAKE 1 TABLET BY MOUTH EVERY DAY 30  tablet 5   No current facility-administered medications on file prior to visit.    Past Medical History  Diagnosis Date  . History of arthroscopy of left knee 1985  . Spinal stenosis     mild  . Hyperlipidemia     mild, with prior statin intolerance  . Osteoporosis     prior Evista use x 5 yrs , 2007  . Hypothyroid   . Tremor   . Viral meningitis     70 years old  . Hashimoto's thyroiditis   . Breast cancer ([place]) 2016    [place] - RT MASTECTOMY    Past Surgical History  Procedure Laterality Date  . Foot surgery      plantar fascitis, Dr. Elvina Mattes  . Knee arthroscopy  1985    right  . Cholecystectomy  2006  . Tonsillectomy    . Cesarean section    . Simple mastectomy with axillary sentinel node biopsy Right 10/10/2014    Procedure: SIMPLE MASTECTOMY;  Surgeon: Leonie Green, MD;  Location: [facility] ORS;  Service: General;   Laterality: Right;  . Sentinel node biopsy Right 10/10/2014    Procedure: SENTINEL NODE BIOPSY;  Surgeon: Leonie Green, MD;  Location: [facility] ORS;  Service: General;  Laterality: Right;  . Mastectomy Right 2016    [place]    Social History   Social History  . Marital Status: Married    Spouse Name: N/A  . Number of Children: N/A  . Years of Education: N/A   Occupational History  .      exercise physiologist   Social History Main Topics  . Smoking status: Never Smoker   . Smokeless tobacco: Never Used  . Alcohol Use: 1.5 oz/week    3 drink(s) per week     Comment: social, 2 times per week  . Drug Use: No  . Sexual Activity: Not on file   Other Topics Concern  . Not on file   Social History Narrative    Family Status  Relation Status Death Age  . Mother Alive     htn, has pacemaker  . Father Deceased 40    AAA  . Sister Alive     breast cancer  . Sister Alive   . Sister Alive   . Son Alive     melanoma  . Daughter Alive     Review of Systems Chronic constipation with hemmorhoid.  A complete 10 system ROS was obtained and was negative apart from what is mentioned.   Objective:   VITALS:   Filed Vitals:   11/26/15 1357  BP: 110/68  Pulse: 90  Height: 5' 7.5" (1.715 m)  Weight: 164 lb (74.39 kg)   Gen:  Appears stated age and in NAD. HEENT:  Normocephalic, atraumatic. The mucous membranes are moist. The superficial temporal arteries are without ropiness or tenderness. Cardiovascular: Regular rate and rhythm. Lungs: Clear to auscultation bilaterally. Neck: There are no carotid bruits noted bilaterally.  NEUROLOGICAL:  Orientation:  The patient is alert and oriented x 3.  Recent and remote memory are intact.  Attention span and concentration are normal.  Able to name objects and repeat without trouble.  Fund of knowledge is appropriate Cranial nerves: There is good facial symmetry.  Extraocular muscles are intact and visual fields are full to  confrontational testing. Speech is fluent and clear. Soft palate rises symmetrically and there is no tongue deviation. Hearing is intact to conversational tone. Tone: Tone is good throughout. Sensation: Sensation is  intact to light touch  Coordination:  The patient has no dysdiadichokinesia or dysmetria. Motor: Strength is 5/5 in the bilateral upper and lower extremities.  Shoulder shrug is equal bilaterally.  There is no pronator drift.  There are no fasciculations noted. DTR's: Deep tendon reflexes are 2/4 at the bilateral biceps, triceps, brachioradialis, patella and achilles.  Plantar responses are downgoing bilaterally. Gait and Station: The patient is able to ambulate without difficulty.   MOVEMENT EXAM: Tremor:  There is mod tremor when the R arm is held proximally, especially when a weight is given.  Tremor is mild-mod of the L arm when held proximally.  She has significant difficulty with Archimedes spirals on the right.        Assessment/Plan:   1.  Essential tremor  -increase inderal and change from LA at patients request.  She likes taking it bid better than q day.  Will now take 40 mg bid.  Watch BP.  Risks, benefits, side effects and alternative therapies were discussed.  The opportunity to ask questions was given and they were answered to the best of my ability.  The patient expressed understanding and willingness to follow the outlined treatment protocols.  -asked about relationship to PD.  Told her that there really is none and not to worry  -discussed DBS therapy.  She doesn't think that she is interested.  Discussed focused u/s 2.  Follow up 3 months.  Much greater than 50% of this visit was spent in counseling and coordinating care.  Total face to face time:  25 min

## 2015-12-07 DIAGNOSIS — I8393 Asymptomatic varicose veins of bilateral lower extremities: Secondary | ICD-10-CM | POA: Diagnosis not present

## 2015-12-07 DIAGNOSIS — D1800 Hemangioma unspecified site: Secondary | ICD-10-CM | POA: Diagnosis not present

## 2015-12-07 DIAGNOSIS — L820 Inflamed seborrheic keratosis: Secondary | ICD-10-CM | POA: Diagnosis not present

## 2015-12-07 DIAGNOSIS — D229 Melanocytic nevi, unspecified: Secondary | ICD-10-CM | POA: Diagnosis not present

## 2015-12-07 DIAGNOSIS — I789 Disease of capillaries, unspecified: Secondary | ICD-10-CM | POA: Diagnosis not present

## 2015-12-07 DIAGNOSIS — I781 Nevus, non-neoplastic: Secondary | ICD-10-CM | POA: Diagnosis not present

## 2015-12-07 DIAGNOSIS — Z1283 Encounter for screening for malignant neoplasm of skin: Secondary | ICD-10-CM | POA: Diagnosis not present

## 2015-12-07 DIAGNOSIS — L821 Other seborrheic keratosis: Secondary | ICD-10-CM | POA: Diagnosis not present

## 2016-01-31 ENCOUNTER — Other Ambulatory Visit: Payer: Self-pay | Admitting: *Deleted

## 2016-01-31 DIAGNOSIS — C50911 Malignant neoplasm of unspecified site of right female breast: Secondary | ICD-10-CM

## 2016-01-31 MED ORDER — LETROZOLE 2.5 MG PO TABS: 2.5000 mg | ORAL_TABLET | Freq: Every day | ORAL | 3 refills | Status: DC

## 2016-02-04 ENCOUNTER — Other Ambulatory Visit: Payer: Self-pay | Admitting: Internal Medicine

## 2016-02-05 DIAGNOSIS — Z23 Encounter for immunization: Secondary | ICD-10-CM | POA: Diagnosis not present

## 2016-02-20 ENCOUNTER — Encounter: Payer: Self-pay | Admitting: Internal Medicine

## 2016-03-05 ENCOUNTER — Other Ambulatory Visit: Payer: Self-pay | Admitting: Neurology

## 2016-03-10 NOTE — Progress Notes (Signed)
Subjective:    Tiffany Chang was seen in consultation in the movement disorder clinic at the request of TULLO, Aris Everts, MD.  The evaluation is for tremor.  She was previously seen by Dr. Loletta Specter at the [place] clinic in 2009.  I do have a note from him from May, 2009.  The patient is a 70 y.o. right handed female with a history of tremor.  Tremor began in approximately 9-10 years ago.  She first noted it in her R hand after a long trip to [place].  It is present with activation.  She went to Dr. Loletta Specter and was dx with ET.  She was started on propranolol 20 mg bid and it helped.  It is still helping and it hasn't really progressing.  The tremor is mainly in her R hand.  No tremor in the head or the legs.  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  There is a family hx of tremor due to PD in paternal grandmother and uncle.  Her balance is good.    Affected by caffeine:  no Affected by alcohol:  no Affected by stress:  no Affected by fatigue:  no Spills soup if on spoon:  no but she can notice the shake and has to concentrate if has peas on a spoon Spills glass of liquid if full:  no Affects ADL's (tying shoes, brushing teeth, etc):  no  Current/Previously tried tremor medications: propranolol  Current medications that may exacerbate tremor:  N/a  08/14/15 update:  Pt returns for f/u, accompanied by her husband who supplements the history.  Haven't seen pt in 2 years.  Pt is still on inderal 20 mg bid for tremor.  Reports that she had breast CA since last visit but did good in that regard.  Had mastectomy only.  Had recent mammogram and going to have biopsy on lesion soon.   Tremor has been slowly progressing. Husband states that she is avoiding situations more, and eating more with the L hand.  She is not spilling food.  She has trouble writing.  No trouble with brushing teeth but does shake when she does that.  No speech trouble.  Able to cook without trouble.  Thinks that  the propranolol helps but doesn't know.  R hand is more tremulous than the left.  No tremor in the legs.     11/26/15 update:  The patient is following up today regarding essential tremor.  Last visit, her propranolol was changed from 20 mg twice a day to Inderal LA, 60 mg daily.  She is doing about the same but no SE with the medication.  No new medical problems.    03/11/16 update:  The patient follows up today for her essential tremor.  I increased her Inderal last visit to 40 mg twice a day.  She reports that "I am feeling great but my tremor isn't better with the increase."  She denies any lightheadedness or near syncope.  She just got back from a trip out [place] soon to [place], [place].  Outside reports reviewed: historical medical records, office notes and referral letter/letters.  Allergies  Allergen Reactions  . Statins Other (See Comments)    Reaction:  Leg cramps   . Bee Venom Hives    Current Outpatient Prescriptions on File Prior to Visit  Medication Sig Dispense Refill  . aspirin EC 81 MG tablet Take 81 mg by mouth daily.    Marland Kitchen CALCIUM  PO Take by mouth.    . Cholecalciferol (VITAMIN D3) 2000 UNITS TABS Take 1 tablet by mouth daily.    Marland Kitchen letrozole (FEMARA) 2.5 MG tablet Take 1 tablet (2.5 mg total) by mouth daily. 30 tablet 3  . propranolol (INDERAL) 40 MG tablet TAKE 1 TABLET(40 MG) BY MOUTH TWICE DAILY 60 tablet 0  . SYNTHROID 88 MCG tablet TAKE 1 TABLET BY MOUTH EVERY DAY 30 tablet 2   No current facility-administered medications on file prior to visit.     Past Medical History:  Diagnosis Date  . Breast cancer ([place]) 2016   [place] - RT MASTECTOMY  . Hashimoto's thyroiditis   . History of arthroscopy of left knee 1985  . Hyperlipidemia    mild, with prior statin intolerance  . Hypothyroid   . Osteoporosis    prior Evista use x 5 yrs , 2007  . Spinal stenosis    mild  . Tremor   . Viral meningitis    70 years old    Past Surgical History:  Procedure  Laterality Date  . CESAREAN SECTION    . CHOLECYSTECTOMY  2006  . FOOT SURGERY     plantar fascitis, Dr. Elvina Mattes  . KNEE ARTHROSCOPY  1985   right  . MASTECTOMY Right 2016   [facility]  . SENTINEL NODE BIOPSY Right 10/10/2014   Procedure: SENTINEL NODE BIOPSY;  Surgeon: Leonie Green, MD;  Location: [facility] ORS;  Service: General;  Laterality: Right;  . SIMPLE MASTECTOMY WITH AXILLARY SENTINEL NODE BIOPSY Right 10/10/2014   Procedure: SIMPLE MASTECTOMY;  Surgeon: Leonie Green, MD;  Location: [facility] ORS;  Service: General;  Laterality: Right;  . TONSILLECTOMY      Social History   Social History  . Marital status: Married    Spouse name: N/A  . Number of children: N/A  . Years of education: N/A   Occupational History  .  [employer]    exercise physiologist   Social History Main Topics  . Smoking status: Never Smoker  . Smokeless tobacco: Never Used  . Alcohol use 1.5 oz/week    3 drink(s) per week     Comment: social, 2 times per week  . Drug use: No  . Sexual activity: Not on file   Other Topics Concern  . Not on file   Social History Narrative  . No narrative on file    Family Status  Relation Status  . Mother Alive   htn, has pacemaker  . Father Deceased at age 83   AAA  . Sister Alive   breast cancer  . Sister Alive  . Sister Alive  . Son Alive   melanoma  . Daughter Alive    Review of Systems Chronic constipation with hemmorhoid.  A complete 10 system ROS was obtained and was negative apart from what is mentioned.   Objective:   VITALS:   Vitals:   03/11/16 1459  BP: 120/84  Pulse: 76  Weight: 165 lb (74.8 kg)  Height: 5\' 8"  (1.727 m)   Gen:  Appears stated age and in NAD. HEENT:  Normocephalic, atraumatic. The mucous membranes are moist. The superficial temporal arteries are without ropiness or tenderness. Cardiovascular: Regular rate and rhythm. Lungs: Clear to auscultation bilaterally. Neck: There are no  carotid bruits noted bilaterally.  NEUROLOGICAL:  Orientation:  The patient is alert and oriented x 3.   Cranial nerves: There is good facial symmetry.  Extraocular muscles are intact  and visual fields are full to confrontational testing. Speech is fluent and clear. Soft palate rises symmetrically and there is no tongue deviation. Hearing is intact to conversational tone. Tone: Tone is good throughout. Sensation: Sensation is intact to light touch  Coordination:  The patient has no dysdiadichokinesia or dysmetria. Motor: Strength is 5/5 in the bilateral upper and lower extremities.  Shoulder shrug is equal bilaterally.  There is no pronator drift.  There are no fasciculations noted.  Gait and Station: The patient is able to ambulate without difficulty.   MOVEMENT EXAM: Tremor:  There is mod tremor when the R arm is held proximally, especially when a weight is given.  Tremor is mild-mod of the L arm when held proximally.  She has difficulty with Archimedes spirals      Assessment/Plan:   1.  Essential tremor  -contine inderal 40 mg bid. May drop dose next visit if primidone some help.  Risks, benefits, side effects and alternative therapies were discussed.  The opportunity to ask questions was given and they were answered to the best of my ability.  The patient expressed understanding and willingness to follow the outlined treatment protocols.  -add primidone, 50 mg qhs.  Discussed first dose effect.  R/b/se discussed.  -pt moving to [place] but wants to return here for now for care.   2.  Follow up 3 months.

## 2016-03-11 ENCOUNTER — Encounter: Payer: Self-pay | Admitting: Neurology

## 2016-03-11 ENCOUNTER — Ambulatory Visit (INDEPENDENT_AMBULATORY_CARE_PROVIDER_SITE_OTHER): Payer: Medicare Other | Admitting: Neurology

## 2016-03-11 VITALS — BP 120/84 | HR 76 | Ht 68.0 in | Wt 165.0 lb

## 2016-03-11 DIAGNOSIS — G250 Essential tremor: Secondary | ICD-10-CM | POA: Diagnosis not present

## 2016-03-11 MED ORDER — PRIMIDONE 50 MG PO TABS: 50.0000 mg | ORAL_TABLET | Freq: Every day | ORAL | 3 refills | Status: DC

## 2016-03-11 NOTE — Patient Instructions (Signed)
1.  Start primidone - 50 mg - 1/2 tablet at night for 4 nights and then increase to 1 tablet nightly 2.  For now stay on the same dose of propranolol and we can decide what, if anything, to do with that in the future visits 3.  Enjoy your new home and your move! 4.  Happy holidays!

## 2016-03-21 ENCOUNTER — Ambulatory Visit: Payer: Medicare Other

## 2016-04-09 ENCOUNTER — Ambulatory Visit: Payer: Medicare Other | Admitting: Oncology

## 2016-04-14 ENCOUNTER — Inpatient Hospital Stay: Payer: Medicare Other | Attending: Oncology | Admitting: Oncology

## 2016-04-14 VITALS — BP 138/74 | HR 74 | Temp 98.2°F | Resp 18 | Wt 166.7 lb

## 2016-04-14 DIAGNOSIS — E785 Hyperlipidemia, unspecified: Secondary | ICD-10-CM | POA: Diagnosis not present

## 2016-04-14 DIAGNOSIS — Z9011 Acquired absence of right breast and nipple: Secondary | ICD-10-CM | POA: Insufficient documentation

## 2016-04-14 DIAGNOSIS — Z170 Estrogen receptor positive status [ER+]: Secondary | ICD-10-CM | POA: Diagnosis not present

## 2016-04-14 DIAGNOSIS — M8580 Other specified disorders of bone density and structure, unspecified site: Secondary | ICD-10-CM | POA: Diagnosis not present

## 2016-04-14 DIAGNOSIS — E063 Autoimmune thyroiditis: Secondary | ICD-10-CM | POA: Diagnosis not present

## 2016-04-14 DIAGNOSIS — Z79811 Long term (current) use of aromatase inhibitors: Secondary | ICD-10-CM | POA: Diagnosis not present

## 2016-04-14 DIAGNOSIS — Z7982 Long term (current) use of aspirin: Secondary | ICD-10-CM | POA: Diagnosis not present

## 2016-04-14 DIAGNOSIS — C50411 Malignant neoplasm of upper-outer quadrant of right female breast: Secondary | ICD-10-CM | POA: Diagnosis not present

## 2016-04-14 DIAGNOSIS — M8589 Other specified disorders of bone density and structure, multiple sites: Secondary | ICD-10-CM

## 2016-04-14 NOTE — Progress Notes (Signed)
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  INTERVAL HISTORY: Patient returns to clinic today for further evaluation and routine 6 month follow-up. She continues to tolerate letrozole well without significant side effects. She currently feels well and is asymptomatic. She has no neurologic complaints. She denies any fevers. She has a good appetite and denies weight loss. She has no chest pain or shortness of breath. She denies any nausea, vomiting, constipation, or diarrhea. She has no urinary complaints. Patient offers no specific complaints today.  REVIEW OF SYSTEMS:   Review of Systems  Constitutional: Negative.  Negative for fever, malaise/fatigue and weight loss.  Respiratory: Negative.  Negative for cough and shortness of breath.   Cardiovascular: Negative.  Negative for chest pain and leg swelling.  Gastrointestinal: Negative.  Negative for abdominal pain.  Musculoskeletal: Negative.   Neurological: Negative.  Negative for sensory change and weakness.  Psychiatric/Behavioral: Negative.  The patient is not nervous/anxious.     As per HPI. Otherwise, a complete review of systems is negative.  PAST MEDICAL HISTORY: Past Medical History:  Diagnosis Date  . Breast cancer ([place]) 2016   [place] - RT MASTECTOMY  . Hashimoto's thyroiditis   . History of arthroscopy of left knee 1985  . Hyperlipidemia    mild, with prior statin intolerance  . Hypothyroid   . Osteoporosis    prior Evista use x 5 yrs , 2007  . Spinal stenosis    mild  . Tremor   . Viral meningitis    70 years old    PAST SURGICAL HISTORY: Past Surgical History:  Procedure Laterality Date  . CESAREAN  SECTION    . CHOLECYSTECTOMY  2006  . FOOT SURGERY     plantar fascitis, Dr. Elvina Mattes  . KNEE ARTHROSCOPY  1985   right  . MASTECTOMY Right 2016   [facility]  . SENTINEL NODE BIOPSY Right 10/10/2014   Procedure: SENTINEL NODE BIOPSY;  Surgeon: Leonie Green, MD;  Location: [facility] ORS;  Service: General;  Laterality: Right;  . SIMPLE MASTECTOMY WITH AXILLARY SENTINEL NODE BIOPSY Right 10/10/2014   Procedure: SIMPLE MASTECTOMY;  Surgeon: Leonie Green, MD;  Location: [facility] OR[facility];  Service: General;  Laterality: Right;  . TONSILLECTOMY      FAMILY HISTORY Family History  Problem Relation Age of Onset  . Osteoporosis Mother   . Cancer Sister 60    Breast  . Breast cancer Sister 41  . Cancer Maternal Grandfather     breast  . Aortic aneurysm Father   . Breast cancer Paternal Grandmother 58  . Breast cancer Maternal Grandmother 86       ADVANCED DIRECTIVES:    HEALTH MAINTENANCE: Social History  Substance Use Topics  . Smoking status: Never Smoker  . Smokeless tobacco: Never Used  . Alcohol use 1.5 oz/week    3 drink(s) per week     Comment: social, 2 times per week     Colonoscopy:  PAP:  Bone density:  Lipid panel:  Allergies  Allergen Reactions  . Statins Other (See Comments)    Reaction:  Leg cramps   . Bee Venom Hives    Current Outpatient Prescriptions  Medication Sig Dispense Refill  . aspirin EC  81 MG tablet Take 81 mg by mouth daily.    Marland Kitchen CALCIUM PO Take by mouth.    . Cholecalciferol (VITAMIN D3) 2000 UNITS TABS Take 1 tablet by mouth daily.    Marland Kitchen docusate sodium (COLACE) 100 MG capsule Take 100 mg by mouth 2 (two) times daily.    Marland Kitchen letrozole (FEMARA) 2.5 MG tablet Take 1 tablet (2.5 mg total) by mouth daily. 30 tablet 3  . primidone (MYSOLINE) 50 MG tablet Take 1 tablet (50 mg total) by mouth at bedtime. 30 tablet 3  . propranolol (INDERAL) 40 MG tablet TAKE 1 TABLET(40 MG) BY MOUTH TWICE DAILY 60 tablet 0  . SYNTHROID 88 MCG tablet TAKE 1 TABLET BY  MOUTH EVERY DAY 30 tablet 2   No current facility-administered medications for this visit.     OBJECTIVE: Vitals:   04/14/16 1515  BP: 138/74  Pulse: 74  Resp: 18  Temp: 98.2 F (36.8 C)     Body mass index is 25.34 kg/m.    ECOG FS:0 - Asymptomatic  General: Well-developed, well-nourished, no acute distress. Eyes: Anicteric sclera. Breasts: Exam deferred today. Lungs: Clear to auscultation bilaterally. Heart: Regular rate and rhythm. No rubs, murmurs, or gallops. Abdomen: Soft, nontender, nondistended. No organomegaly noted, normoactive bowel sounds. Musculoskeletal: No edema, cyanosis, or clubbing. Neuro: Alert, answering all questions appropriately. Cranial nerves grossly intact. Skin: No rashes or petechiae noted. Psych: Normal affect.   LAB RESULTS:  Lab Results  Component Value Date   NA 138 08/06/2015   K 4.7 08/06/2015   CL 102 08/06/2015   CO2 30 08/06/2015   GLUCOSE 92 08/06/2015   BUN 16 08/06/2015   CREATININE 0.72 08/06/2015   CALCIUM 9.8 08/06/2015   PROT 7.1 08/06/2015   ALBUMIN 4.2 08/06/2015   AST 14 08/06/2015   ALT 11 08/06/2015   ALKPHOS 82 08/06/2015   BILITOT 0.4 08/06/2015   GFRNONAA >60 10/03/2014   GFRAA >60 10/03/2014    Lab Results  Component Value Date   WBC 6.0 08/06/2015   NEUTROABS 4.0 08/06/2015   HGB 14.3 08/06/2015   HCT 42.1 08/06/2015   MCV 86.2 08/06/2015   PLT 320.0 08/06/2015     STUDIES: No results found.  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  PLAN:    1. 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 Given the fact that her malignancy is lobular in nature, she did not require chemotherapy. Continue letrozole for 5 years completing in June 2021. No  further intervention is needed at this time. Return to clinic in June 2018 with repeat laboratory work and further evaluation.  2. Osteopenia: Bone mineral density in June 2017 revealed a T score of -1.7 which is unchanged from one year prior. Since patient is moving to [place], she will have a repeat bone mineral density in clinic visit on the same day in June 2018. 3. Right breast abnormality: Patient had recent biopsy that was negative for malignancy. Continue mammograms as above.    Patient expressed understanding and was in agreement with this plan. She also understands that She can call clinic at any time with any questions, concerns, or complaints.    Lloyd Huger, MD   04/16/2016 8:48 AM

## 2016-04-16 ENCOUNTER — Inpatient Hospital Stay: Payer: Medicare Other | Admitting: Oncology

## 2016-04-28 ENCOUNTER — Telehealth: Payer: Self-pay | Admitting: Neurology

## 2016-04-28 MED ORDER — PROPRANOLOL HCL 40 MG PO TABS: ORAL_TABLET | ORAL | 5 refills | Status: DC

## 2016-04-28 NOTE — Telephone Encounter (Signed)
Tiffany Chang [birth year].  She has moved to [place] and uses [facility] [place] # [phone]. She is needing a refill on her Propanolol 40 mg 2 x a day. Her # is [id number]. Thank you

## 2016-04-28 NOTE — Telephone Encounter (Signed)
RX sent to pharmacy  

## 2016-05-07 ENCOUNTER — Other Ambulatory Visit: Payer: Self-pay | Admitting: Internal Medicine

## 2016-05-07 ENCOUNTER — Encounter: Payer: Self-pay | Admitting: Internal Medicine

## 2016-05-07 NOTE — Telephone Encounter (Signed)
Rx Levothyroxine 88 mcg #30 Refill # 3 was sent over to [facility] [place],  [place] instead of [facility] per patient and new pharmacy request, Pt has Appt come up in March 2018

## 2016-06-05 ENCOUNTER — Encounter: Payer: Self-pay | Admitting: Internal Medicine

## 2016-06-10 NOTE — Progress Notes (Signed)
Subjective:    Tiffany Chang was seen in consultation in the movement disorder clinic at the request of TULLO, Aris Everts, MD.  The evaluation is for tremor.  She was previously seen by Dr. Loletta Specter at the [facility] in 2009.  I do have a note from him from May, 2009.  The patient is a 71 y.o. right handed female with a history of tremor.  Tremor began in approximately 9-10 years ago.  She first noted it in her R hand after a long trip to [place].  It is present with activation.  She went to Dr. Loletta Specter and was dx with ET.  She was started on propranolol 20 mg bid and it helped.  It is still helping and it hasn't really progressing.  The tremor is mainly in her R hand.  No tremor in the head or the legs.  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  There is a family hx of tremor due to PD in paternal grandmother and uncle.  Her balance is good.    Affected by caffeine:  no Affected by alcohol:  no Affected by stress:  no Affected by fatigue:  no Spills soup if on spoon:  no but she can notice the shake and has to concentrate if has peas on a spoon Spills glass of liquid if full:  no Affects ADL's (tying shoes, brushing teeth, etc):  no  Current/Previously tried tremor medications: propranolol  Current medications that may exacerbate tremor:  N/a  08/14/15 update:  Pt returns for f/u, accompanied by her husband who supplements the history.  Haven't seen pt in 2 years.  Pt is still on inderal 20 mg bid for tremor.  Reports that she had breast CA since last visit but did good in that regard.  Had mastectomy only.  Had recent mammogram and going to have biopsy on lesion soon.   Tremor has been slowly progressing. Husband states that she is avoiding situations more, and eating more with the L hand.  She is not spilling food.  She has trouble writing.  No trouble with brushing teeth but does shake when she does that.  No speech trouble.  Able to cook without trouble.  Thinks that  the propranolol helps but doesn't know.  R hand is more tremulous than the left.  No tremor in the legs.     11/26/15 update:  The patient is following up today regarding essential tremor.  Last visit, her propranolol was changed from 20 mg twice a day to Inderal LA, 60 mg daily.  She is doing about the same but no SE with the medication.  No new medical problems.    03/11/16 update:  The patient follows up today for her essential tremor.  I increased her Inderal last visit to 40 mg twice a day.  She reports that "I am feeling great but my tremor isn't better with the increase."  She denies any lightheadedness or near syncope.  She just got back from a trip out [place] soon to [place], [place].  06/12/16 update:  Patient follows up today for her essential tremor.  She is on propranolol, 40 mg twice a day.  She was started on primidone, 50 mg daily last visit.  She reports that it is working well, without SE.  It does make her sleepy but she takes it at bedtime.  She moved 2 months ago and has been unpacking and loves her new place.  Outside reports reviewed: historical medical records, office notes and referral letter/letters.  Allergies  Allergen Reactions  . Statins Other (See Comments)    Reaction:  Leg cramps   . Bee Venom Hives    Current Outpatient Prescriptions on File Prior to Visit  Medication Sig Dispense Refill  . aspirin EC 81 MG tablet Take 81 mg by mouth daily.    Marland Kitchen CALCIUM PO Take by mouth.    . Cholecalciferol (VITAMIN D3) 2000 UNITS TABS Take 1 tablet by mouth daily.    Marland Kitchen docusate sodium (COLACE) 100 MG capsule Take 100 mg by mouth 2 (two) times daily.    Marland Kitchen letrozole (FEMARA) 2.5 MG tablet Take 1 tablet (2.5 mg total) by mouth daily. 30 tablet 3  . levothyroxine (SYNTHROID) 88 MCG tablet Take 1 tablet (88 mcg total) by mouth daily. 30 tablet 3   No current facility-administered medications on file prior to visit.     Past Medical History:  Diagnosis Date  . Breast  cancer ([place]) 2016   [facility] - RT MASTECTOMY  . Hashimoto's thyroiditis   . History of arthroscopy of left knee 1985  . Hyperlipidemia    mild, with prior statin intolerance  . Hypothyroid   . Osteoporosis    prior Evista use x 5 yrs , 2007  . Spinal stenosis    mild  . Tremor   . Viral meningitis    71 years old    Past Surgical History:  Procedure Laterality Date  . CESAREAN SECTION    . CHOLECYSTECTOMY  2006  . FOOT SURGERY     plantar fascitis, Dr. Elvina Mattes  . KNEE ARTHROSCOPY  1985   right  . MASTECTOMY Right 2016   [facility]  . SENTINEL NODE BIOPSY Right 10/10/2014   Procedure: SENTINEL NODE BIOPSY;  Surgeon: Leonie Green, MD;  Location: [facility] ORS;  Service: General;  Laterality: Right;  . SIMPLE MASTECTOMY WITH AXILLARY SENTINEL NODE BIOPSY Right 10/10/2014   Procedure: SIMPLE MASTECTOMY;  Surgeon: Leonie Green, MD;  Location: [facility] ORS;  Service: General;  Laterality: Right;  . TONSILLECTOMY      Social History   Social History  . Marital status: Married    Spouse name: N/A  . Number of children: N/A  . Years of education: N/A   Occupational History  .  [employer]    exercise physiologist   Social History Main Topics  . Smoking status: Never Smoker  . Smokeless tobacco: Never Used  . Alcohol use 1.5 oz/week    3 drink(s) per week     Comment: social, 2 times per week  . Drug use: No  . Sexual activity: Not on file   Other Topics Concern  . Not on file   Social History Narrative  . No narrative on file    Family Status  Relation Status  . Mother Alive   htn, has pacemaker  . Father Deceased at age 24   AAA  . Sister Alive   breast cancer  . Sister Alive  . Sister Alive  . Son Alive   melanoma  . Daughter Alive    Review of Systems Chronic constipation with hemmorhoid.  A complete 10 system ROS was obtained and was negative apart from what is mentioned.   Objective:   VITALS:   Vitals:   06/12/16 1410    BP: 114/62  Pulse: 82  Weight: 165 lb (74.8 kg)  Height: 5\' 8"  (1.727  m)   Gen:  Appears stated age and in NAD. HEENT:  Normocephalic, atraumatic. The mucous membranes are moist. The superficial temporal arteries are without ropiness or tenderness. Cardiovascular: Regular rate and rhythm. Lungs: Clear to auscultation bilaterally. Neck: There are no carotid bruits noted bilaterally.  NEUROLOGICAL:  Orientation:  The patient is alert and oriented x 3.   Cranial nerves: There is good facial symmetry.  Extraocular muscles are intact and visual fields are full to confrontational testing. Speech is fluent and clear. Soft palate rises symmetrically and there is no tongue deviation. Hearing is intact to conversational tone. Tone: Tone is good throughout. Sensation: Sensation is intact to light touch  Coordination:  The patient has no dysdiadichokinesia or dysmetria. Motor: Strength is 5/5 in the bilateral upper and lower extremities.  Shoulder shrug is equal bilaterally.  There is no pronator drift.  There are no fasciculations noted.  Gait and Station: The patient is able to ambulate without difficulty.   MOVEMENT EXAM: Tremor:  There is mild-mod tremor with a weight in the hand on the right but almost no tremor without a weight.  There is virtually no tremor on the L with or without a weight in the hand.     Assessment/Plan:   1.  Essential tremor  -contine inderal 40 mg bid. May drop dose next visit if primidone some help.  Risks, benefits, side effects and alternative therapies were discussed.  The opportunity to ask questions was given and they were answered to the best of my ability.  The patient expressed understanding and willingness to follow the outlined treatment protocols.  -continue primidone, 50 mg qhs.  Discussed first dose effect.  R/b/se discussed.  -Living in  [place] but wants to return here for now for care.   2.  Follow up 7-8 months.

## 2016-06-12 ENCOUNTER — Ambulatory Visit (INDEPENDENT_AMBULATORY_CARE_PROVIDER_SITE_OTHER): Payer: Medicare Other | Admitting: Neurology

## 2016-06-12 ENCOUNTER — Encounter: Payer: Self-pay | Admitting: Neurology

## 2016-06-12 VITALS — BP 114/62 | HR 82 | Ht 68.0 in | Wt 165.0 lb

## 2016-06-12 DIAGNOSIS — G250 Essential tremor: Secondary | ICD-10-CM | POA: Diagnosis not present

## 2016-06-12 MED ORDER — PRIMIDONE 50 MG PO TABS: 50.0000 mg | ORAL_TABLET | Freq: Every day | ORAL | 8 refills | Status: DC

## 2016-06-12 MED ORDER — PROPRANOLOL HCL 40 MG PO TABS: ORAL_TABLET | ORAL | 8 refills | Status: DC

## 2016-07-02 ENCOUNTER — Other Ambulatory Visit: Payer: Self-pay | Admitting: Neurology

## 2016-07-02 DIAGNOSIS — C50911 Malignant neoplasm of unspecified site of right female breast: Secondary | ICD-10-CM

## 2016-07-16 ENCOUNTER — Other Ambulatory Visit: Payer: Self-pay | Admitting: Neurology

## 2016-07-16 DIAGNOSIS — C50911 Malignant neoplasm of unspecified site of right female breast: Secondary | ICD-10-CM

## 2016-07-17 ENCOUNTER — Other Ambulatory Visit: Payer: Self-pay | Admitting: Neurology

## 2016-07-17 MED ORDER — PROPRANOLOL HCL 40 MG PO TABS: ORAL_TABLET | ORAL | 5 refills | Status: DC

## 2016-07-18 ENCOUNTER — Other Ambulatory Visit: Payer: Self-pay | Admitting: *Deleted

## 2016-07-18 MED ORDER — LETROZOLE 2.5 MG PO TABS: 2.5000 mg | ORAL_TABLET | Freq: Every day | ORAL | 3 refills | Status: DC

## 2016-07-25 ENCOUNTER — Encounter: Payer: Self-pay | Admitting: Internal Medicine

## 2016-07-25 ENCOUNTER — Ambulatory Visit (INDEPENDENT_AMBULATORY_CARE_PROVIDER_SITE_OTHER): Payer: Medicare Other | Admitting: Internal Medicine

## 2016-07-25 VITALS — BP 102/60 | HR 72 | Resp 16 | Ht 68.0 in | Wt 165.0 lb

## 2016-07-25 DIAGNOSIS — E559 Vitamin D deficiency, unspecified: Secondary | ICD-10-CM

## 2016-07-25 DIAGNOSIS — R7301 Impaired fasting glucose: Secondary | ICD-10-CM

## 2016-07-25 DIAGNOSIS — E785 Hyperlipidemia, unspecified: Secondary | ICD-10-CM

## 2016-07-25 DIAGNOSIS — M8580 Other specified disorders of bone density and structure, unspecified site: Secondary | ICD-10-CM

## 2016-07-25 DIAGNOSIS — R5383 Other fatigue: Secondary | ICD-10-CM

## 2016-07-25 DIAGNOSIS — Z1231 Encounter for screening mammogram for malignant neoplasm of breast: Secondary | ICD-10-CM

## 2016-07-25 DIAGNOSIS — G250 Essential tremor: Secondary | ICD-10-CM | POA: Diagnosis not present

## 2016-07-25 DIAGNOSIS — E039 Hypothyroidism, unspecified: Secondary | ICD-10-CM

## 2016-07-25 DIAGNOSIS — Z853 Personal history of malignant neoplasm of breast: Secondary | ICD-10-CM

## 2016-07-25 DIAGNOSIS — Z1239 Encounter for other screening for malignant neoplasm of breast: Secondary | ICD-10-CM

## 2016-07-25 DIAGNOSIS — Z0000 Encounter for general adult medical examination without abnormal findings: Secondary | ICD-10-CM

## 2016-07-25 DIAGNOSIS — K649 Unspecified hemorrhoids: Secondary | ICD-10-CM | POA: Diagnosis not present

## 2016-07-25 NOTE — Progress Notes (Signed)
Pre visit review using our clinic review tool, if applicable. No additional management support is needed unless otherwise documented below in the visit note. 

## 2016-07-25 NOTE — Patient Instructions (Addendum)
For your hemorrhoids, everything is now available OTC:  Preparation H is a topical steroid cream Nupercainal is topical lidocaine Good Sense Hemorrhoid is topical phenylephrine (vasoconstrictor) SO IT WILL SHRINK THE HEMORRHOID   next colonoscopy is due March 2019 NEXT MAMMOGRAM DUE April 4,  I AM ORDERING NOW  GET YORU PNEUMOVAX WHEN  YOU SEE DENISA  FASTING LABS THE MORNING AFTER

## 2016-07-25 NOTE — Progress Notes (Signed)
Patient ID: Tiffany Chang, female    DOB: 1945-09-28  Age: 71 y.o. MRN: [id number]  The patient is here for annual comprehensive exam and management of other chronic and acute problems.   Due for mammogram , s/p right mastectomy May 2016. Had a negative biopsy left breast  last year  Tolerating letrozole  Colonoscopy  2014 dexa 2017, next one June 2018 per [facility] Pap normal 2015  Annual eye exam last October,  porfilio  Early cataracts  Sees tara stewart annually   the risk factors are reflected in the social history.  The roster of all physicians providing medical care to patient - is listed in the Snapshot section of the chart.  Activities of daily living:  The patient is 100% independent in all ADLs: dressing, toileting, feeding as well as independent mobility  Home safety : The patient has smoke detectors in the home. They wear seatbelts.  There are no firearms at home. There is no violence in the home.   There is no risks for hepatitis, STDs or HIV. There is no   history of blood transfusion. They have no travel history to infectious disease endemic areas of the world.  The patient has seen their dentist in the last six month. They have seen their eye doctor in the last year. They admit to slight hearing difficulty with regard to whispered voices and some television programs.  They have deferred audiologic testing in the last year.  They do not  have excessive sun exposure. Discussed the need for sun protection: hats, long sleeves and use of sunscreen if there is significant sun exposure.   Diet: the importance of a healthy diet is discussed. They do have a healthy diet.  The benefits of regular aerobic exercise were discussed. She exercises 5 tims per week , 60 mnutes daily .   Depression screen: there are no signs or vegative symptoms of depression- irritability, change in appetite, anhedonia, sadness/tearfullness.  Cognitive assessment: the patient manages all their financial and  personal affairs and is actively engaged. They could relate day,date,year and events; recalled 2/3 objects at 3 minutes; performed clock-face test normally.  The following portions of the patient's history were reviewed and updated as appropriate: allergies, current medications, past family history, past medical history,  past surgical history, past social history  and problem list.  Visual acuity was not assessed per patient preference since she has regular follow up with her ophthalmologist. Hearing and body mass index were assessed and reviewed.   During the course of the visit the patient was educated and counseled about appropriate screening and preventive services including : fall prevention , diabetes screening, nutrition counseling, colorectal cancer screening, and recommended immunizations.    CC: The primary encounter diagnosis was Hyperlipidemia LDL goal <160. Diagnoses of Vitamin D deficiency, Breast cancer screening, Fatigue, unspecified type, Acquired hypothyroidism, Impaired fasting glucose, History of breast cancer, Benign essential tremor, Osteopenia, unspecified location, Hemorrhoids, unspecified hemorrhoid type, and Encounter for preventive health examination were also pertinent to this visit. Sleeping fine Using primidone  At night  For tremors  15 minute daily power nap refreshes her  Wants to lose 10 lbs,  Likes to eat and cook  Exercising 5-6 days per week   ROM right shoulder improving since mastectomy.  no lymphedema    History Tiffany Chang has a past medical history of Breast cancer ([place]) (2016); Hashimoto's thyroiditis; History of arthroscopy of left knee (1985); Hyperlipidemia; Hypothyroid; Osteoporosis; Spinal stenosis; Tremor; and Viral meningitis.  She has a past surgical history that includes Foot surgery; Knee arthroscopy (1985); Cholecystectomy (2006); Tonsillectomy; Cesarean section; Simple mastectomy with axillary sentinel node biopsy (Right, 10/10/2014); Sentinel node  biopsy (Right, 10/10/2014); and Mastectomy (Right, 2016).   Her family history includes Aortic aneurysm in her father; Breast cancer (age of onset: 21) in her sister; Breast cancer (age of onset: 66) in her maternal grandmother and paternal grandmother; Cancer in her maternal grandfather; Cancer (age of onset: 62) in her sister; Osteoporosis in her mother.She reports that she has never smoked. She has never used smokeless tobacco. She reports that she drinks about 1.5 oz of alcohol per week . She reports that she does not use drugs.  Outpatient Medications Prior to Visit  Medication Sig Dispense Refill  . aspirin EC 81 MG tablet Take 81 mg by mouth daily.    Marland Kitchen CALCIUM PO Take by mouth.    . Cholecalciferol (VITAMIN D3) 2000 UNITS TABS Take 1 tablet by mouth daily.    Marland Kitchen docusate sodium (COLACE) 100 MG capsule Take 100 mg by mouth 2 (two) times daily.    Marland Kitchen letrozole (FEMARA) 2.5 MG tablet Take 1 tablet (2.5 mg total) by mouth daily. 30 tablet 3  . levothyroxine (SYNTHROID) 88 MCG tablet Take 1 tablet (88 mcg total) by mouth daily. 30 tablet 3  . primidone (MYSOLINE) 50 MG tablet Take 1 tablet (50 mg total) by mouth at bedtime. 30 tablet 8  . propranolol (INDERAL) 40 MG tablet TAKE 1 TABLET(40 MG) BY MOUTH TWICE DAILY 60 tablet 5   No facility-administered medications prior to visit.     Review of Systems   Patient denies headache, fevers, malaise, unintentional weight loss, skin rash, eye pain, sinus congestion and sinus pain, sore throat, dysphagia,  hemoptysis , cough, dyspnea, wheezing, chest pain, palpitations, orthopnea, edema, abdominal pain, nausea, melena, diarrhea, constipation, flank pain, dysuria, hematuria, urinary  Frequency, nocturia, numbness, tingling, seizures,  Focal weakness, Loss of consciousness, insomnia, depression, anxiety, and suicidal ideation.      Objective:  BP 102/60   Pulse 72   Resp 16   Ht 5\' 8"  (1.727 m)   Wt 165 lb (74.8 kg)   SpO2 97%   BMI 25.09 kg/m    Physical Exam   General appearance: alert, cooperative and appears stated age Head: Normocephalic, without obvious abnormality, atraumatic Eyes: conjunctivae/corneas clear. PERRL, EOM's intact. Fundi benign. Ears: normal TM's and external ear canals both ears Nose: Nares normal. Septum midline. Mucosa normal. No drainage or sinus tenderness. Throat: lips, mucosa, and tongue normal; teeth and gums normal Neck: no adenopathy, no carotid bruit, no JVD, supple, symmetrical, trachea midline and thyroid not enlarged, symmetric, no tenderness/mass/nodules Lungs: clear to auscultation bilaterally Breasts: s/p right mastectomy, left breast with normal appearance, no masses or tenderness Heart: regular rate and rhythm, S1, S2 normal, no murmur, click, rub or gallop Abdomen: soft, non-tender; bowel sounds normal; no masses,  no organomegaly Extremities: extremities normal, atraumatic, no cyanosis or edema Pulses: 2+ and symmetric Skin: Skin color, texture, turgor normal. No rashes or lesions Neurologic: Alert and oriented X 3, normal strength and tone. Normal symmetric reflexes. Normal coordination and gait.     Assessment & Plan:   Problem List Items Addressed This Visit    Benign essential tremor    Managed with primidone at night . No changes today       Encounter for preventive health examination    Annual comprehensive preventive exam was done as well as  an evaluation and management of chronic conditions .  During the course of the visit the patient was educated and counseled about appropriate screening and preventive services including :  diabetes screening, lipid analysis with projected  10 year  risk for CAD , nutrition counseling, breast, cervical and colorectal cancer screening, and recommended immunizations.  Printed recommendations for health maintenance screenings was given      Hemorrhoids    Reviewed available medications for control of symptoms of pain and itching.        Hyperlipidemia LDL goal <160 - Primary    mild, with prior statin intolerance.  Retrial of crestor  caused myalgias. Recommended trial of red yeast rice but she has deferred  Until she can  discuss with [facility] pharmacist.       Relevant Orders   LDL cholesterol, direct   Lipid panel   Osteopenia    Treated historically with evista 10 years ago. Repeated in 2017 due to use of letrozole will continue annually per Oncology       Vitamin D deficiency   Relevant Orders   VITAMIN D 25 Hydroxy (Vit-D Deficiency, Fractures)    Other Visit Diagnoses    Breast cancer screening       Fatigue, unspecified type       Relevant Orders   Comprehensive metabolic panel   Microalbumin / creatinine urine ratio   CBC with Differential/Platelet   Acquired hypothyroidism       Relevant Orders   TSH   Impaired fasting glucose       Relevant Orders   Hemoglobin A1c   History of breast cancer       Relevant Orders   MS DIGITAL DIAG UNI LEFT      I am having Ms. Sassaman maintain her aspirin EC, Vitamin D3, CALCIUM PO, docusate sodium, levothyroxine, primidone, propranolol, and letrozole.  No orders of the defined types were placed in this encounter.   There are no discontinued medications.  Follow-up: Return for NEEDS FASTING LABS APPT [place] .   Crecencio Mc, MD

## 2016-07-27 DIAGNOSIS — K649 Unspecified hemorrhoids: Secondary | ICD-10-CM | POA: Insufficient documentation

## 2016-07-27 NOTE — Assessment & Plan Note (Signed)
mild, with prior statin intolerance.  Retrial of crestor  caused myalgias. Recommended trial of red yeast rice but she has deferred  Until she can  discuss with [facility] pharmacist.

## 2016-07-27 NOTE — Assessment & Plan Note (Signed)
Reviewed available medications for control of symptoms of pain and itching.

## 2016-07-27 NOTE — Assessment & Plan Note (Signed)
Managed with primidone at night . No changes today  

## 2016-07-27 NOTE — Assessment & Plan Note (Signed)
Annual comprehensive preventive exam was done as well as an evaluation and management of chronic conditions .  During the course of the visit the patient was educated and counseled about appropriate screening and preventive services including :  diabetes screening, lipid analysis with projected  10 year  risk for CAD , nutrition counseling, breast, cervical and colorectal cancer screening, and recommended immunizations.  Printed recommendations for health maintenance screenings was given 

## 2016-07-27 NOTE — Assessment & Plan Note (Addendum)
Treated historically with evista 10 years ago. Repeated in 2017 due to use of letrozole will continue annually per Oncology

## 2016-07-31 ENCOUNTER — Other Ambulatory Visit: Payer: Self-pay | Admitting: Neurology

## 2016-08-04 ENCOUNTER — Ambulatory Visit (INDEPENDENT_AMBULATORY_CARE_PROVIDER_SITE_OTHER): Payer: Medicare Other

## 2016-08-04 VITALS — BP 102/80 | HR 63 | Temp 98.3°F | Resp 12 | Ht 68.0 in | Wt 165.0 lb

## 2016-08-04 DIAGNOSIS — Z23 Encounter for immunization: Secondary | ICD-10-CM | POA: Diagnosis not present

## 2016-08-04 DIAGNOSIS — Z0000 Encounter for general adult medical examination without abnormal findings: Secondary | ICD-10-CM | POA: Diagnosis not present

## 2016-08-04 NOTE — Progress Notes (Signed)
Subjective:   Tiffany Chang is a 71 y.o. female who presents for Medicare Annual (Subsequent) preventive examination.  Review of Systems:  No ROS.  Medicare Wellness Visit.  Cardiac Risk Factors include: advanced age (>68men, >35 women)     Objective:     Vitals: BP 102/80 (BP Location: Left Arm, Patient Position: Sitting, Cuff Size: Normal)   Pulse 63   Temp 98.3 F (36.8 C) (Oral)   Resp 12   Ht 5\' 8"  (1.727 m)   Wt 165 lb (74.8 kg)   SpO2 95%   BMI 25.09 kg/m   Body mass index is 25.09 kg/m.   Tobacco History  Smoking Status  . Never Smoker  Smokeless Tobacco  . Never Used     Counseling given: Not Answered   Past Medical History:  Diagnosis Date  . Breast cancer ([place]) 2016   [place] - RT MASTECTOMY  . Hashimoto's thyroiditis   . History of arthroscopy of left knee 1985  . Hyperlipidemia    mild, with prior statin intolerance  . Hypothyroid   . Osteoporosis    prior Evista use x 5 yrs , 2007  . Spinal stenosis    mild  . Tremor   . Viral meningitis    71 years old   Past Surgical History:  Procedure Laterality Date  . CESAREAN SECTION    . CHOLECYSTECTOMY  2006  . FOOT SURGERY     plantar fascitis, Dr. Elvina Mattes  . KNEE ARTHROSCOPY  1985   right  . MASTECTOMY Right 2016   [facility]  . SENTINEL NODE BIOPSY Right 10/10/2014   Procedure: SENTINEL NODE BIOPSY;  Surgeon: Leonie Green, MD;  Location: [facility] ORS;  Service: General;  Laterality: Right;  . SIMPLE MASTECTOMY WITH AXILLARY SENTINEL NODE BIOPSY Right 10/10/2014   Procedure: SIMPLE MASTECTOMY;  Surgeon: Leonie Green, MD;  Location: [facility] ORS;  Service: General;  Laterality: Right;  . TONSILLECTOMY     Family History  Problem Relation Age of Onset  . Osteoporosis Mother   . Aortic aneurysm Father   . Cancer Sister 74    Breast  . Breast cancer Sister 30  . Cancer Maternal Grandfather     breast  . Breast cancer Paternal Grandmother 32  . Breast cancer Maternal Grandmother 86    History  Sexual Activity  . Sexual activity: Not Currently    Outpatient Encounter Prescriptions as of 08/04/2016  Medication Sig  . aspirin EC 81 MG tablet Take 81 mg by mouth daily.  Marland Kitchen CALCIUM PO Take by mouth.  . Cholecalciferol (VITAMIN D3) 2000 UNITS TABS Take 1 tablet by mouth daily.  Marland Kitchen docusate sodium (COLACE) 100 MG capsule Take 100 mg by mouth 2 (two) times daily.  Marland Kitchen letrozole (FEMARA) 2.5 MG tablet Take 1 tablet (2.5 mg total) by mouth daily.  Marland Kitchen levothyroxine (SYNTHROID) 88 MCG tablet Take 1 tablet (88 mcg total) by mouth daily.  . primidone (MYSOLINE) 50 MG tablet TAKE 1 TABLET BY MOUTH EVERY DAY AT BEDTIME  . propranolol (INDERAL) 40 MG tablet TAKE 1 TABLET(40 MG) BY MOUTH TWICE DAILY   No facility-administered encounter medications on file as of 08/04/2016.     Activities of Daily Living In your present state of health, do you have any difficulty performing the following activities: 08/04/2016  Hearing? N  Vision? N  Difficulty concentrating or making decisions? N  Walking or climbing stairs? N  Dressing or bathing? N  Doing errands, shopping? N  Preparing Food and eating ? N  Using the Toilet? N  In the past six months, have you accidently leaked urine? N  Do you have problems with loss of bowel control? N  Managing your Medications? N  Managing your Finances? N  Housekeeping or managing your Housekeeping? N  Some recent data might be hidden    Patient Care Team: Crecencio Mc, MD as PCP - General (Internal Medicine)    Assessment:    This is a routine wellness examination for Tiffany Chang. he goal of the wellness visit is to assist the patient how to close the gaps in care and create a preventative care plan for the patient.   Taking calcium VIT D as appropriate/Osteoporosis reviewed.  Medications reviewed; taking without issues or barriers.  Safety issues reviewed; smoke detectors in the home. No firearms in the home.  Wears seatbelts when driving or riding  with others. Patient does wear sunscreen or protective clothing when in direct sunlight. No violence in the home.  Patient is alert, normal appearance, oriented to person/place/and time. Correctly identified the president of the [place], recall of 3/3 words, and performing simple calculations.  Patient displays appropriate judgement and can read correct time from watch face.  No new identified risk were noted.  No failures at ADL's or IADL's.   BMI- discussed the importance of a healthy diet, water intake and exercise. Educational material provided.   Diet: Breakfast: Eggs, bacon, yogurt Lunch: Soup, 1/2 sandwich Dinner: [employer], green vegetable, fruit Daily fluid intake: 2 cups of caffeine, 6 cups of water  HTN- followed by PCP.  Dental- every six months. Dr. Delorise Jackson.  Eye- Visual acuity not assessed per patient preference since they have regular follow up with the ophthalmologist.  Wears corrective lenses.  Sleep patterns- Sleeps 7-8 hours at night.  Wakes feeling rested.   Pneumovax 23 va administered L deltoid, tolerated well. Educational material provided.  Health maintenance gaps- closed.  Patient Concerns: None at this time. Follow up with PCP as needed.  Exercise Activities and Dietary recommendations Current Exercise Habits: Structured exercise class, Type of exercise: walking;calisthenics, Time (Minutes): 20, Frequency (Times/Week): 5, Weekly Exercise (Minutes/Week): 100, Intensity: Moderate  Goals    . Healthy Lifestyle          Stay hydrated Stay active Low carb foods      Fall Risk Fall Risk  08/04/2016 06/12/2016 03/11/2016 11/26/2015 07/11/2013  Falls in the past year? No No No No No   Depression Screen PHQ 2/9 Scores 08/04/2016 07/25/2016 07/11/2013  PHQ - 2 Score 0 0 0     Cognitive Function     6CIT Screen 08/04/2016  What Year? 0 points  What month? 0 points  What time? 0 points  Count back from 20 0 points  Months in reverse 0 points    Repeat phrase 0 points  Total Score 0    Immunization History  Administered Date(s) Administered  . Influenza Split 02/17/2012, 02/16/2013  . Influenza-Unspecified 02/16/2014, 02/17/2015, 03/06/2016  . Pneumococcal Conjugate-13 07/11/2013  . Pneumococcal Polysaccharide-23 08/04/2016  . Tdap 06/29/2012  . Zoster 06/25/2013   Screening Tests Health Maintenance  Topic Date Due  . MAMMOGRAM  08/20/2017  . TETANUS/TDAP  06/29/2022  . COLONOSCOPY  07/27/2022  . INFLUENZA VACCINE  Completed  . DEXA SCAN  Completed  . Hepatitis C Screening  Completed  . PNA vac Low Risk Adult  Completed      Plan:    End of life  planning; Advance aging; Advanced directives discussed. Copy of current HCPOA/Living Will requested.    Medicare Attestation I have personally reviewed: The patient's medical and social history Their use of alcohol, tobacco or illicit drugs Their current medications and supplements The patient's functional ability including ADLs,fall risks, home safety risks, cognitive, and hearing and visual impairment Diet and physical activities Evidence for depression   The patient's weight, height, BMI, and visual acuity have been recorded in the chart.  I have made referrals and provided education to the patient based on review of the above and I have provided the patient with a written personalized care plan for preventive services.    During the course of the visit the patient was educated and counseled about the following appropriate screening and preventive services:   Vaccines to include Pneumoccal, Influenza, Hepatitis B, Td, Zostavax, HCV  Colorectal cancer screening-UTD  Bone density screening-UTD  Glaucoma screening-annual eye exam  Mammography-UTD  Nutrition counseling   Patient Instructions (the written plan) was given to the patient.   Varney Biles, LPN  [id number]

## 2016-08-04 NOTE — Patient Instructions (Addendum)
  Ms. Salido , Thank you for taking time to come for your Medicare Wellness Visit. I appreciate your ongoing commitment to your health goals. Please review the following plan we discussed and let me know if I can assist you in the future.   Follow up with Dr. Derrel Nip as needed.    Bring a copy of your [place] and/or Living Will to be scanned into chart.  Have a great day!  These are the goals we discussed: Goals    . Healthy Lifestyle          Stay hydrated Stay active Low carb foods       This is a list of the screening recommended for you and due dates:  Health Maintenance  Topic Date Due  . Mammogram  08/20/2017  . Tetanus Vaccine  06/29/2022  . Colon Cancer Screening  07/27/2022  . Flu Shot  Completed  . DEXA scan (bone density measurement)  Completed  .  Hepatitis C: One time screening is recommended by Center for Disease Control  (CDC) for  adults born from [birth year] through 1965.   Completed  . Pneumonia vaccines  Completed

## 2016-08-05 ENCOUNTER — Other Ambulatory Visit (INDEPENDENT_AMBULATORY_CARE_PROVIDER_SITE_OTHER): Payer: Medicare Other

## 2016-08-05 DIAGNOSIS — E559 Vitamin D deficiency, unspecified: Secondary | ICD-10-CM

## 2016-08-05 DIAGNOSIS — E785 Hyperlipidemia, unspecified: Secondary | ICD-10-CM | POA: Diagnosis not present

## 2016-08-05 DIAGNOSIS — R7301 Impaired fasting glucose: Secondary | ICD-10-CM | POA: Diagnosis not present

## 2016-08-05 DIAGNOSIS — E039 Hypothyroidism, unspecified: Secondary | ICD-10-CM | POA: Diagnosis not present

## 2016-08-05 DIAGNOSIS — R5383 Other fatigue: Secondary | ICD-10-CM

## 2016-08-05 LAB — CBC WITH DIFFERENTIAL/PLATELET
Basophils Absolute: 0 10*3/uL (ref 0.0–0.1)
Basophils Relative: 0.7 % (ref 0.0–3.0)
Eosinophils Absolute: 0.2 10*3/uL (ref 0.0–0.7)
Eosinophils Relative: 3.1 % (ref 0.0–5.0)
HCT: 40.8 % (ref 36.0–46.0)
Hemoglobin: 13.5 g/dL (ref 12.0–15.0)
Lymphocytes Relative: 24 % (ref 12.0–46.0)
Lymphs Abs: 1.4 10*3/uL (ref 0.7–4.0)
MCHC: 33.2 g/dL (ref 30.0–36.0)
MCV: 88.5 fl (ref 78.0–100.0)
Monocytes Absolute: 0.3 10*3/uL (ref 0.1–1.0)
Monocytes Relative: 5.8 % (ref 3.0–12.0)
Neutro Abs: 3.8 10*3/uL (ref 1.4–7.7)
Neutrophils Relative %: 66.4 % (ref 43.0–77.0)
Platelets: 335 10*3/uL (ref 150.0–400.0)
RBC: 4.61 Mil/uL (ref 3.87–5.11)
RDW: 13.7 % (ref 11.5–15.5)
WBC: 5.7 10*3/uL (ref 4.0–10.5)

## 2016-08-05 LAB — LIPID PANEL
Cholesterol: 253 mg/dL — ABNORMAL HIGH (ref 0–200)
HDL: 42.5 mg/dL (ref >39.00)
LDL Cholesterol: 184 mg/dL — ABNORMAL HIGH (ref 0–99)
NonHDL: 210.77
Total CHOL/HDL Ratio: 6
Triglycerides: 135 mg/dL (ref 0.0–149.0)
VLDL: 27 mg/dL (ref 0.0–40.0)

## 2016-08-05 LAB — COMPREHENSIVE METABOLIC PANEL
ALT: 11 U/L (ref 0–35)
AST: 10 U/L (ref 0–37)
Albumin: 4 g/dL (ref 3.5–5.2)
Alkaline Phosphatase: 83 U/L (ref 39–117)
BUN: 18 mg/dL (ref 6–23)
CO2: 30 mEq/L (ref 19–32)
Calcium: 9.6 mg/dL (ref 8.4–10.5)
Chloride: 105 mEq/L (ref 96–112)
Creatinine, Ser: 0.75 mg/dL (ref 0.40–1.20)
GFR: 81.55 mL/min (ref >60.00)
Glucose, Bld: 109 mg/dL — ABNORMAL HIGH (ref 70–99)
Potassium: 5.1 mEq/L (ref 3.5–5.1)
Sodium: 141 mEq/L (ref 135–145)
Total Bilirubin: 0.4 mg/dL (ref 0.2–1.2)
Total Protein: 6.4 g/dL (ref 6.0–8.3)

## 2016-08-05 LAB — HEMOGLOBIN A1C: Hgb A1c MFr Bld: 6 % (ref 4.6–6.5)

## 2016-08-05 LAB — TSH: TSH: 1.7 u[IU]/mL (ref 0.35–4.50)

## 2016-08-05 LAB — VITAMIN D 25 HYDROXY (VIT D DEFICIENCY, FRACTURES): VITD: 23.42 ng/mL — ABNORMAL LOW (ref 30.00–100.00)

## 2016-08-05 LAB — LDL CHOLESTEROL, DIRECT: Direct LDL: 189 mg/dL

## 2016-08-05 LAB — MICROALBUMIN / CREATININE URINE RATIO
Creatinine,U: 102.7 mg/dL
Microalb Creat Ratio: 0.7 mg/g (ref 0.0–30.0)
Microalb, Ur: <0.7 mg/dL (ref 0.0–1.9)

## 2016-08-07 ENCOUNTER — Other Ambulatory Visit: Payer: Self-pay | Admitting: Neurology

## 2016-08-07 MED ORDER — PROPRANOLOL HCL 40 MG PO TABS: ORAL_TABLET | ORAL | 1 refills | Status: DC

## 2016-08-10 ENCOUNTER — Encounter: Payer: Self-pay | Admitting: Internal Medicine

## 2016-08-12 NOTE — Progress Notes (Signed)
  I have reviewed the above information and agree with above.   Mervyn Pflaum, MD 

## 2016-08-12 NOTE — Telephone Encounter (Signed)
I called to set up mammogram for patient but, [facility] says because last [place] says she needs screening mammo yearly will have to be screening, you would want a 3 -D right?

## 2016-08-19 ENCOUNTER — Other Ambulatory Visit: Payer: Self-pay | Admitting: Internal Medicine

## 2016-08-19 DIAGNOSIS — Z853 Personal history of malignant neoplasm of breast: Secondary | ICD-10-CM

## 2016-08-27 ENCOUNTER — Other Ambulatory Visit: Payer: Self-pay | Admitting: Neurology

## 2016-08-27 ENCOUNTER — Other Ambulatory Visit: Payer: Self-pay | Admitting: Internal Medicine

## 2016-08-27 MED ORDER — PRIMIDONE 50 MG PO TABS: 50.0000 mg | ORAL_TABLET | Freq: Every day | ORAL | 5 refills | Status: DC

## 2016-08-31 IMAGING — MG MM DIAG BREAST TOMO UNI RIGHT
4 series · 4 of 12 positions shown · non-contrast
Comparison: Previous exam(s).

CLINICAL DATA: Post right breast stereotactic biopsy.

EXAM:
DIAGNOSTIC RIGHT MAMMOGRAM POST STEREOTACTIC BIOPSY

[R CC]
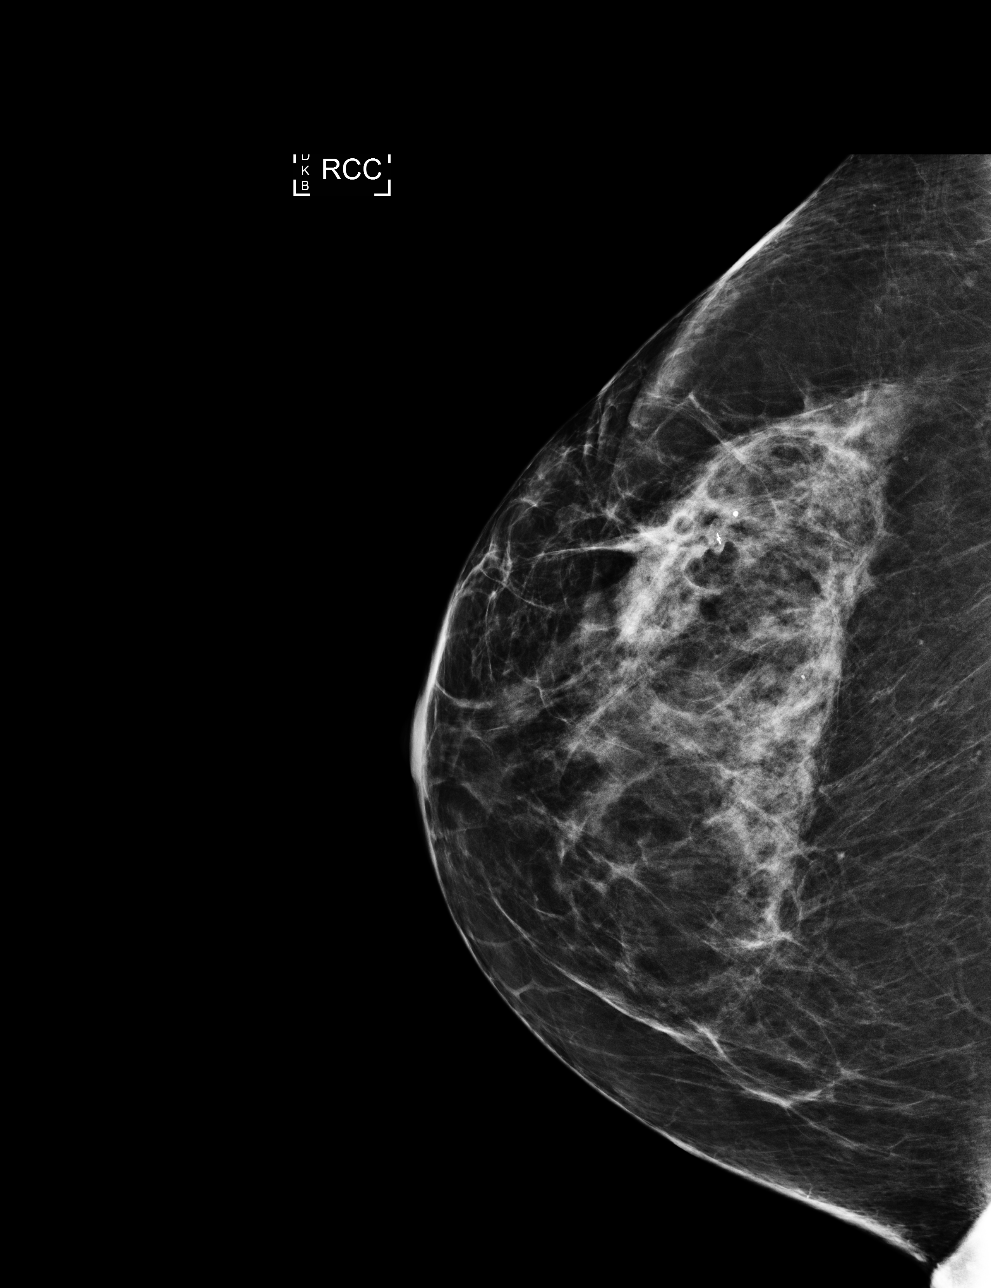

[R ML]
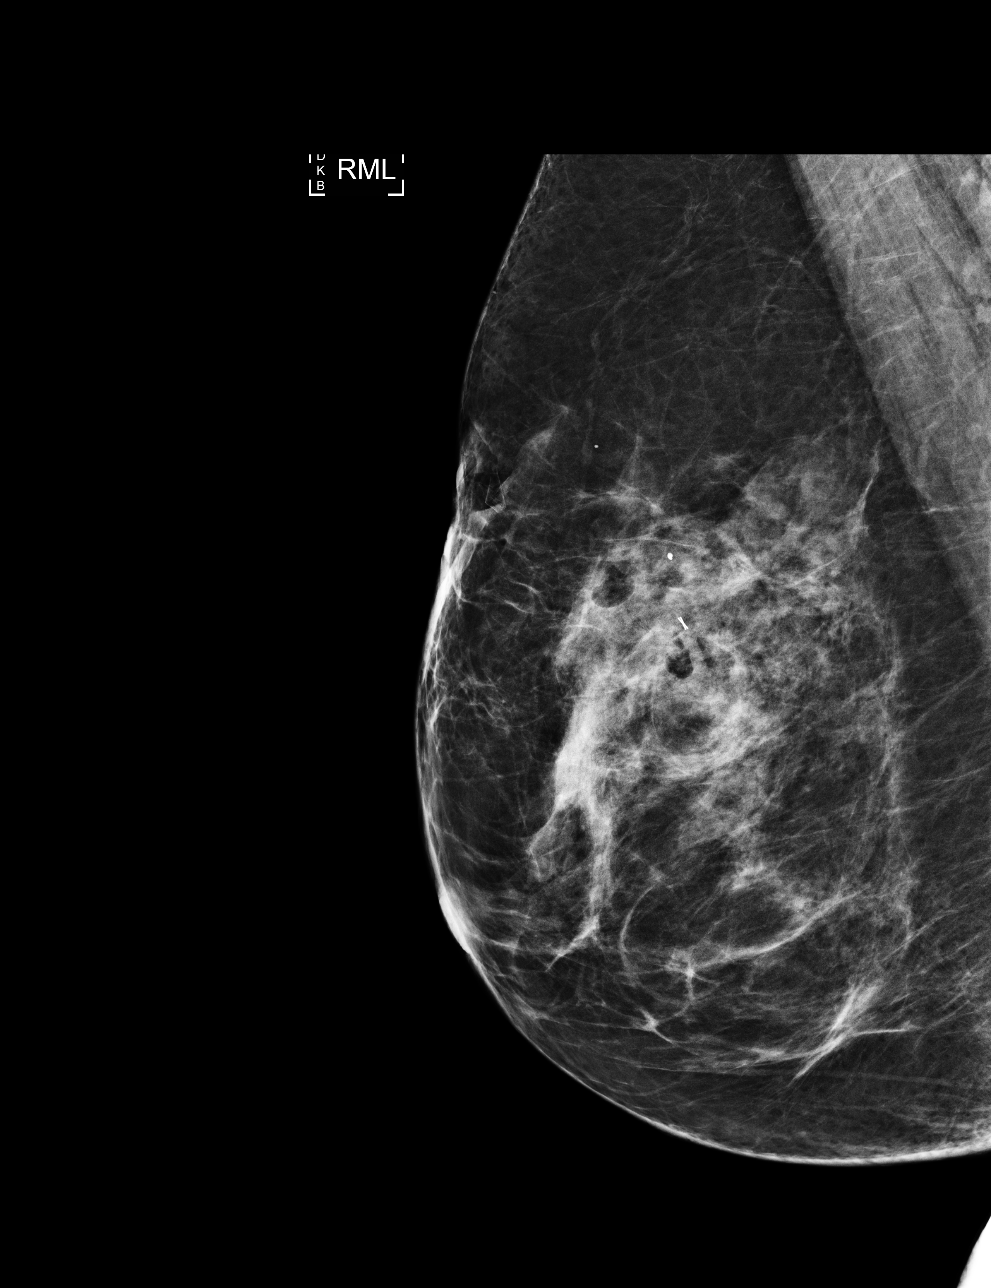

[R CC tomo · tomo slice 38/75.0]
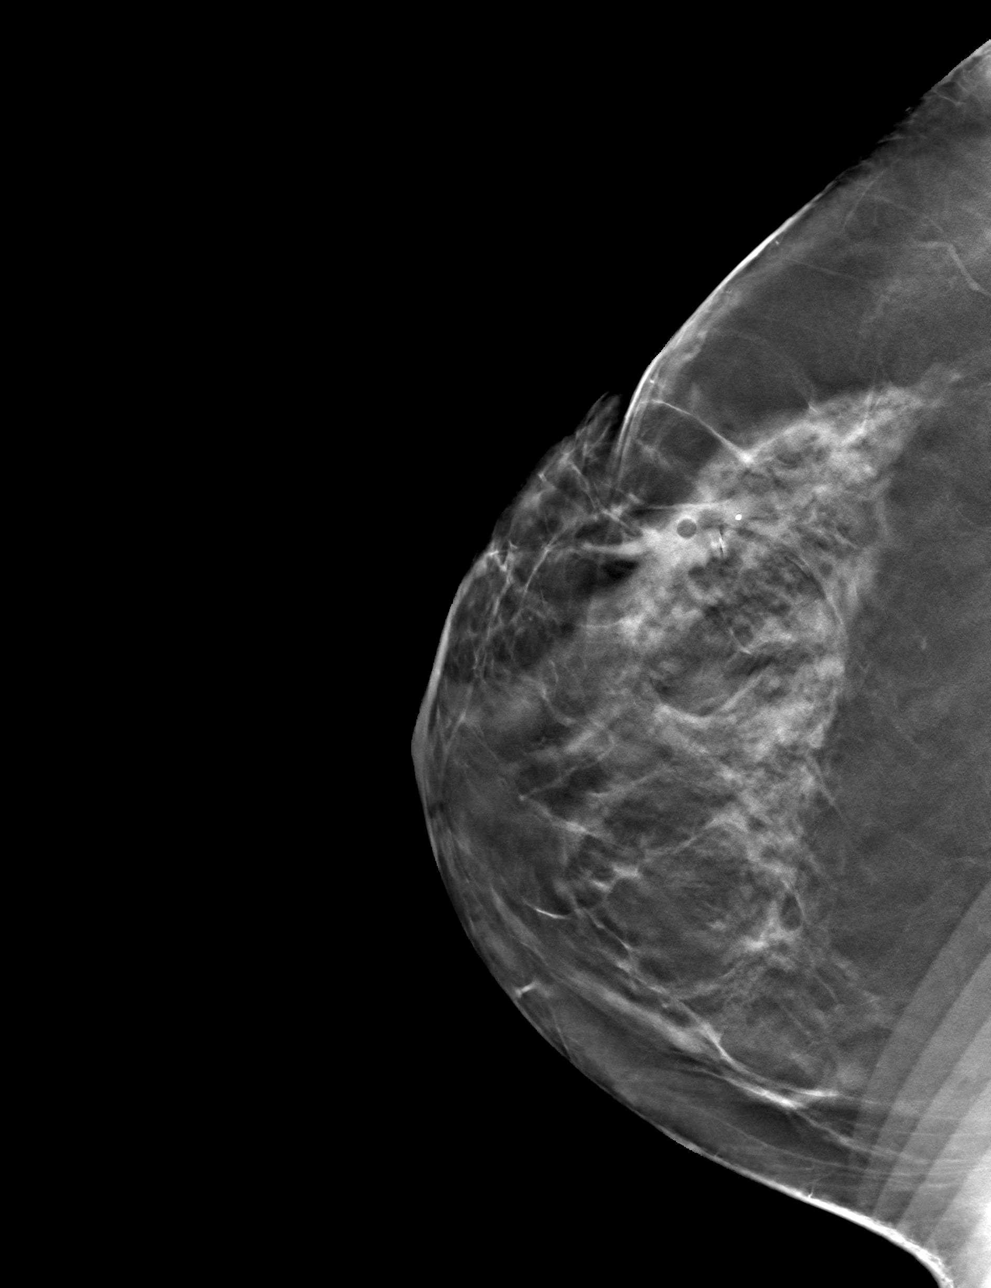

[R ML tomo · tomo slice 35/69.0]
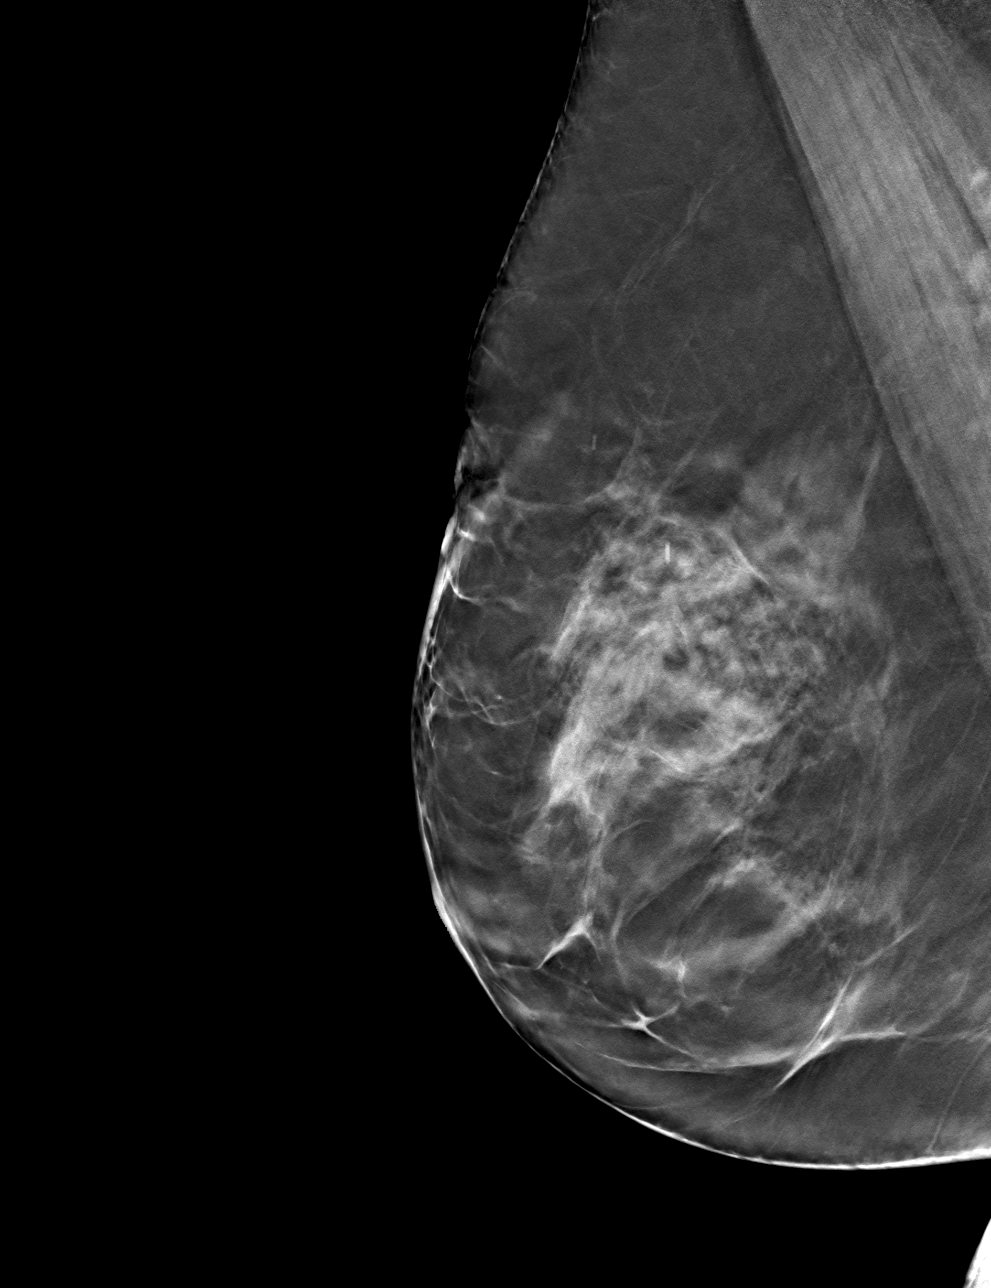

[4 of 12 positions shown; findings below may reference images not displayed]

FINDINGS: Mammographic images were obtained following stereotactic guided
biopsy of area of distortion located within the upper outer quadrant
of the right breast. The X shaped clip is in appropriate position
IMPRESSION: Appropriate positioning of clip following right breast stereotactic
biopsy

Final Assessment: Post Procedure Mammograms for Marker Placement

## 2016-09-11 ENCOUNTER — Other Ambulatory Visit: Payer: Medicare Other

## 2016-09-11 ENCOUNTER — Ambulatory Visit: Payer: Medicare Other

## 2016-09-24 ENCOUNTER — Other Ambulatory Visit: Payer: Self-pay | Admitting: Internal Medicine

## 2016-09-24 ENCOUNTER — Ambulatory Visit
Admission: RE | Admit: 2016-09-24 | Discharge: 2016-09-24 | Disposition: A | Discharge status: Home / Self Care | Payer: Medicare Other | Source: Ambulatory Visit | Attending: Internal Medicine | Admitting: Internal Medicine

## 2016-09-24 DIAGNOSIS — Z853 Personal history of malignant neoplasm of breast: Secondary | ICD-10-CM

## 2016-09-24 DIAGNOSIS — Z1231 Encounter for screening mammogram for malignant neoplasm of breast: Secondary | ICD-10-CM

## 2016-09-25 ENCOUNTER — Encounter: Payer: Self-pay | Admitting: Internal Medicine

## 2016-10-02 ENCOUNTER — Encounter: Payer: Self-pay | Admitting: Internal Medicine

## 2016-10-22 NOTE — Progress Notes (Signed)
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  INTERVAL HISTORY: Patient returns to clinic today for further evaluation and routine 6 month follow-up. She continues to tolerate letrozole well without significant side effects. She currently feels well and is asymptomatic. She has no neurologic complaints. She denies any fevers. She has a good appetite and denies weight loss. She has no chest pain or shortness of breath. She denies any nausea, vomiting, constipation, or diarrhea. She has no urinary complaints. Patient offers no specific complaints today.  REVIEW OF SYSTEMS:   Review of Systems  Constitutional: Negative.  Negative for fever, malaise/fatigue and weight loss.  Respiratory: Negative.  Negative for cough and shortness of breath.   Cardiovascular: Negative.  Negative for chest pain and leg swelling.  Gastrointestinal: Negative.  Negative for abdominal pain.  Musculoskeletal: Negative.   Neurological: Negative.  Negative for sensory change and weakness.  Psychiatric/Behavioral: Negative.  The patient is not nervous/anxious.     As per HPI. Otherwise, a complete review of systems is negative.  PAST MEDICAL HISTORY: Past Medical History:  Diagnosis Date  . Breast cancer ([place]) 2016   [place] - RT MASTECTOMY  . Hashimoto's thyroiditis   . History of arthroscopy of left knee 1985  . Hyperlipidemia    mild, with prior statin intolerance  . Hypothyroid   . Osteoporosis    prior Evista use x 5 yrs , 2007  . Spinal stenosis    mild  . Tremor   . Viral meningitis    71 years old    PAST SURGICAL HISTORY: Past Surgical History:  Procedure Laterality Date  . CESAREAN  SECTION    . CHOLECYSTECTOMY  2006  . FOOT SURGERY     plantar fascitis, Dr. Elvina Mattes  . KNEE ARTHROSCOPY  1985   right  . MASTECTOMY Right 2016   [facility]  . SENTINEL NODE BIOPSY Right 10/10/2014   Procedure: SENTINEL NODE BIOPSY;  Surgeon: Leonie Green, MD;  Location: [facility] ORS;  Service: General;  Laterality: Right;  . SIMPLE MASTECTOMY WITH AXILLARY SENTINEL NODE BIOPSY Right 10/10/2014   Procedure: SIMPLE MASTECTOMY;  Surgeon: Leonie Green, MD;  Location: [facility] ORS;  Service: General;  Laterality: Right;  . TONSILLECTOMY      FAMILY HISTORY Family History  Problem Relation Age of Onset  . Osteoporosis Mother   . Aortic aneurysm Father   . Cancer Sister 43       Breast  . Breast cancer Sister 63  . Cancer Maternal Grandfather        breast  . Breast cancer Maternal Grandmother 86       ADVANCED DIRECTIVES:    HEALTH MAINTENANCE: Social History  Substance Use Topics  . Smoking status: Never Smoker  . Smokeless tobacco: Never Used  . Alcohol use 1.5 oz/week    3 Standard drinks or equivalent per week     Comment: social, 2 times per week     Colonoscopy:  PAP:  Bone density:  Lipid panel:  Allergies  Allergen Reactions  . Statins Other (See Comments)    Reaction:  Leg cramps   . Bee Venom Hives    Current Outpatient Prescriptions  Medication Sig Dispense Refill  .  aspirin EC 81 MG tablet Take 81 mg by mouth daily.    Marland Kitchen CALCIUM PO Take by mouth.    . Cholecalciferol (VITAMIN D3) 2000 UNITS TABS Take 1 tablet by mouth daily.    Marland Kitchen docusate sodium (COLACE) 100 MG capsule Take 100 mg by mouth 2 (two) times daily.    Marland Kitchen letrozole (FEMARA) 2.5 MG tablet Take 1 tablet (2.5 mg total) by mouth daily. 30 tablet 3  . levothyroxine (SYNTHROID, LEVOTHROID) 88 MCG tablet TAKE 1 TABLET BY MOUTH EVERY DAY 30 tablet 3  . primidone (MYSOLINE) 50 MG tablet Take 1 tablet (50 mg total) by mouth at bedtime. 90 tablet 3  . propranolol (INDERAL) 40 MG tablet TAKE 1  TABLET(40 MG) BY MOUTH TWICE DAILY 180 tablet 1   No current facility-administered medications for this visit.     OBJECTIVE: Vitals:   10/23/16 1040  BP: 116/75  Pulse: 64  Temp: 98.3 F (36.8 C)     Body mass index is 24.71 kg/m.    ECOG FS:0 - Asymptomatic  General: Well-developed, well-nourished, no acute distress. Eyes: Anicteric sclera. Breasts: Exam deferred today. Patient has a right mastectomy. Lungs: Clear to auscultation bilaterally. Heart: Regular rate and rhythm. No rubs, murmurs, or gallops. Abdomen: Soft, nontender, nondistended. No organomegaly noted, normoactive bowel sounds. Musculoskeletal: No edema, cyanosis, or clubbing. Neuro: Alert, answering all questions appropriately. Cranial nerves grossly intact. Skin: No rashes or petechiae noted. Psych: Normal affect.   LAB RESULTS:  Lab Results  Component Value Date   NA 141 08/05/2016   K 5.1 08/05/2016   CL 105 08/05/2016   CO2 30 08/05/2016   GLUCOSE 109 (H) 08/05/2016   BUN 18 08/05/2016   CREATININE 0.75 08/05/2016   CALCIUM 9.6 08/05/2016   PROT 6.4 08/05/2016   ALBUMIN 4.0 08/05/2016   AST 10 08/05/2016   ALT 11 08/05/2016   ALKPHOS 83 08/05/2016   BILITOT 0.4 08/05/2016   GFRNONAA >60 10/03/2014   GFRAA >60 10/03/2014    Lab Results  Component Value Date   WBC 5.7 08/05/2016   NEUTROABS 3.8 08/05/2016   HGB 13.5 08/05/2016   HCT 40.8 08/05/2016   MCV 88.5 08/05/2016   PLT 335.0 08/05/2016     STUDIES: Dg Bone Density  Result Date: 10/27/2016 EXAM: DUAL X-RAY ABSORPTIOMETRY (DXA) FOR BONE MINERAL DENSITY IMPRESSION: Dear Dr. Delight Hoh, Your patient Deashia Soule completed a BMD test on 10/23/2016 using the [employer] (analysis version: 14.10) manufactured by [employer]. The following summarizes the results of our evaluation. PATIENT BIOGRAPHICAL: Name: Karolynn, Infantino Patient ID: [id number] Birth Date: October 20, 1945 Height: 67.5 in. Gender: Female Exam Date: 10/23/2016 Weight:  163.3 lbs. Indications: Breast CA, Caucasian, Family Hx of Osteoporosis, Height Loss, High Risk Meds, Postmenopausal Fractures: Treatments: ASPRIN 81 MG, Calcium, Letrazole, Synthroid, Vit D ASSESSMENT: The BMD measured at Femur Neck Right is 0.774 g/cm2 with a T-score of -1.9. This patient is considered osteopenic according to [facility]) criteria. L-4 was excluded due to degenerative changes. Site Region Measured Measured WHO Young Adult BMD Date       Age      Classification T-score AP Spine L1-L3 10/23/2016 68.7 Osteopenia -1.2 1.038 g/cm2 AP Spine L1-L3 10/23/2015 67.7 Normal -0.7 1.099 g/cm2 AP Spine L1-L3 10/31/2014 66.7 Normal -0.7 1.101 g/cm2 AP Spine L1-L3 08/31/2013 65.5 Normal -0.6 1.107 g/cm2 AP Spine L1-L3 02/27/2010 62.0 Normal -0.6 1.105 g/cm2 DualFemur Neck Right 10/23/2016 68.7 Osteopenia -1.9 0.774 g/cm2 DualFemur Neck  Right 10/23/2015 67.7 Osteopenia -1.7 0.807 g/cm2 DualFemur Neck Right 10/31/2014 66.7 Osteopenia -1.7 0.805 g/cm2 DualFemur Neck Right 02/27/2010 62.0 Osteopenia -1.8 0.793 g/cm2 World Health Organization [facility]) criteria for post-menopausal, Caucasian Women: Normal:       T-score at or above -1 SD Osteopenia:   T-score between -1 and -2.5 SD Osteoporosis: T-score at or below -2.5 SD RECOMMENDATIONS: [place] recommends that FDA-approved medical therapies be considered in postmenopausal women and men age 27 or older with a: 1. Hip or vertebral (clinical or morphometric) fracture. 2. T-score of < -2.5 at the spine or hip. 3. Ten-year fracture probability by FRAX of 3% or greater for hip fracture or 20% or greater for major osteoporotic fracture. All treatment decisions require clinical judgment and consideration of individual patient factors, including patient preferences, co-morbidities, previous drug use, risk factors not captured in the FRAX model (e.g. falls, vitamin D deficiency, increased bone turnover, interval significant decline in  bone density) and possible under - or over-estimation of fracture risk by FRAX. All patients should ensure an adequate intake of dietary calcium (1200 mg/d) and vitamin D (800 IU daily) unless contraindicated. FOLLOW-UP: People with diagnosed cases of osteoporosis or at high risk for fracture should have regular bone mineral density tests. For patients eligible for Medicare, routine testing is allowed once every 2 years. The testing frequency can be increased to one year for patients who have rapidly progressing disease, those who are receiving or discontinuing medical therapy to restore bone mass, or have additional risk factors. I have reviewed this report, and agree with the above findings. [facility] Radiology Dear Dr. Delight Hoh, Your patient AUDERY WASSENAAR completed a FRAX assessment on 10/23/2016 using the [place] (analysis version: 14.10) manufactured by [employer]. The following summarizes the results of our evaluation. PATIENT BIOGRAPHICAL: Name: Avamarie, Crossley Patient ID: [id number] Birth Date: Dec 15, 1945 Height:    67.5 in. Gender:     Female    [age]       Weight:    163.3 lbs. Ethnicity:  White                            Exam Date: 10/23/2016 FRAX* RESULTS:  (version: 3.5) 10-year Probability of Fracture1 Major Osteoporotic Fracture2 Hip Fracture 11.4% 2.0% Population: [place] (Caucasian) Risk Factors: None Based on Femur (Right) Neck BMD 1 -The 10-year probability of fracture may be lower than reported if the patient has received treatment. 2 -Major Osteoporotic Fracture: Clinical Spine, Forearm, Hip or Shoulder *FRAX is a Materials engineer of the [employer] for Metabolic Bone Disease, a [place] (WHO) [facility]. ASSESSMENT: The probability of a major osteoporotic fracture is 11.4% within the next ten years. The probability of a hip fracture is 2.0% within the next ten years. . Electronically Signed   By: Marijo Conception, M.D.    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  PLAN:    1. 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 Given the fact that her malignancy is lobular in nature, she did not require chemotherapy. Continue letrozole for 5 years completing in June  2021. No further intervention is needed at this time. Return to clinic in 6 months with repeat laboratory work and further evaluation.  2. Osteopenia: Bone mineral density on October 23, 2016 revealed a T score of -1.9 which is essentially unchanged from one year prior. Repeat bone mineral density in June 2019. 3. Left breast abnormality: Patient had breast biopsy on August 21, 2015 that was negative for malignancy. Continue mammograms as above.    Patient expressed understanding and was in agreement with this plan. She also understands that She can call clinic at any time with any questions, concerns, or complaints.    Lloyd Huger, MD   10/31/2016 7:25 AM

## 2016-10-23 ENCOUNTER — Inpatient Hospital Stay: Payer: Medicare Other | Attending: Oncology | Admitting: Oncology

## 2016-10-23 ENCOUNTER — Ambulatory Visit
Admission: RE | Admit: 2016-10-23 | Discharge: 2016-10-23 | Disposition: A | Discharge status: Home / Self Care | Payer: Medicare Other | Source: Ambulatory Visit | Attending: Oncology | Admitting: Oncology

## 2016-10-23 ENCOUNTER — Ambulatory Visit: Payer: Medicare Other | Admitting: Oncology

## 2016-10-23 ENCOUNTER — Other Ambulatory Visit: Payer: Self-pay | Admitting: *Deleted

## 2016-10-23 VITALS — BP 116/75 | HR 64 | Temp 98.3°F | Wt 162.5 lb

## 2016-10-23 DIAGNOSIS — E063 Autoimmune thyroiditis: Secondary | ICD-10-CM | POA: Insufficient documentation

## 2016-10-23 DIAGNOSIS — C50411 Malignant neoplasm of upper-outer quadrant of right female breast: Secondary | ICD-10-CM | POA: Diagnosis not present

## 2016-10-23 DIAGNOSIS — R251 Tremor, unspecified: Secondary | ICD-10-CM | POA: Diagnosis not present

## 2016-10-23 DIAGNOSIS — M4800 Spinal stenosis, site unspecified: Secondary | ICD-10-CM | POA: Insufficient documentation

## 2016-10-23 DIAGNOSIS — Z170 Estrogen receptor positive status [ER+]: Secondary | ICD-10-CM | POA: Insufficient documentation

## 2016-10-23 DIAGNOSIS — Z9011 Acquired absence of right breast and nipple: Secondary | ICD-10-CM | POA: Insufficient documentation

## 2016-10-23 DIAGNOSIS — E785 Hyperlipidemia, unspecified: Secondary | ICD-10-CM | POA: Diagnosis not present

## 2016-10-23 DIAGNOSIS — M85851 Other specified disorders of bone density and structure, right thigh: Secondary | ICD-10-CM | POA: Diagnosis not present

## 2016-10-23 DIAGNOSIS — E039 Hypothyroidism, unspecified: Secondary | ICD-10-CM | POA: Diagnosis not present

## 2016-10-23 DIAGNOSIS — Z79811 Long term (current) use of aromatase inhibitors: Secondary | ICD-10-CM | POA: Diagnosis not present

## 2016-10-23 DIAGNOSIS — M8589 Other specified disorders of bone density and structure, multiple sites: Secondary | ICD-10-CM | POA: Diagnosis not present

## 2016-10-23 DIAGNOSIS — M8580 Other specified disorders of bone density and structure, unspecified site: Secondary | ICD-10-CM | POA: Insufficient documentation

## 2016-10-23 MED ORDER — PRIMIDONE 50 MG PO TABS: 50.0000 mg | ORAL_TABLET | Freq: Every day | ORAL | 3 refills | Status: DC

## 2016-10-23 NOTE — Progress Notes (Signed)
Patient here today for follow up.  Patient states no new concerns today  

## 2016-11-10 ENCOUNTER — Other Ambulatory Visit: Payer: Self-pay | Admitting: *Deleted

## 2016-11-10 MED ORDER — LETROZOLE 2.5 MG PO TABS: 2.5000 mg | ORAL_TABLET | Freq: Every day | ORAL | 1 refills | Status: DC

## 2016-12-09 DIAGNOSIS — I8393 Asymptomatic varicose veins of bilateral lower extremities: Secondary | ICD-10-CM | POA: Diagnosis not present

## 2016-12-09 DIAGNOSIS — L820 Inflamed seborrheic keratosis: Secondary | ICD-10-CM | POA: Diagnosis not present

## 2016-12-09 DIAGNOSIS — L821 Other seborrheic keratosis: Secondary | ICD-10-CM | POA: Diagnosis not present

## 2016-12-09 DIAGNOSIS — I788 Other diseases of capillaries: Secondary | ICD-10-CM | POA: Diagnosis not present

## 2016-12-09 DIAGNOSIS — D229 Melanocytic nevi, unspecified: Secondary | ICD-10-CM | POA: Diagnosis not present

## 2016-12-09 DIAGNOSIS — D1800 Hemangioma unspecified site: Secondary | ICD-10-CM | POA: Diagnosis not present

## 2016-12-09 DIAGNOSIS — I781 Nevus, non-neoplastic: Secondary | ICD-10-CM | POA: Diagnosis not present

## 2016-12-12 DIAGNOSIS — M7632 Iliotibial band syndrome, left leg: Secondary | ICD-10-CM | POA: Diagnosis not present

## 2016-12-12 DIAGNOSIS — M25562 Pain in left knee: Secondary | ICD-10-CM | POA: Diagnosis not present

## 2016-12-12 DIAGNOSIS — M1712 Unilateral primary osteoarthritis, left knee: Secondary | ICD-10-CM | POA: Diagnosis not present

## 2016-12-18 ENCOUNTER — Other Ambulatory Visit: Payer: Self-pay | Admitting: Internal Medicine

## 2016-12-18 DIAGNOSIS — M11262 Other chondrocalcinosis, left knee: Secondary | ICD-10-CM | POA: Diagnosis not present

## 2016-12-19 DIAGNOSIS — M11262 Other chondrocalcinosis, left knee: Secondary | ICD-10-CM | POA: Diagnosis not present

## 2016-12-26 DIAGNOSIS — M11262 Other chondrocalcinosis, left knee: Secondary | ICD-10-CM | POA: Diagnosis not present

## 2016-12-26 DIAGNOSIS — S83242A Other tear of medial meniscus, current injury, left knee, initial encounter: Secondary | ICD-10-CM | POA: Diagnosis not present

## 2016-12-29 DIAGNOSIS — M1712 Unilateral primary osteoarthritis, left knee: Secondary | ICD-10-CM | POA: Diagnosis not present

## 2016-12-31 DIAGNOSIS — M1712 Unilateral primary osteoarthritis, left knee: Secondary | ICD-10-CM | POA: Diagnosis not present

## 2017-01-04 ENCOUNTER — Encounter: Payer: Self-pay | Admitting: Internal Medicine

## 2017-01-05 ENCOUNTER — Other Ambulatory Visit: Payer: Self-pay | Admitting: Internal Medicine

## 2017-01-05 MED ORDER — DOXYCYCLINE HYCLATE 100 MG PO TABS: 100.0000 mg | ORAL_TABLET | Freq: Two times a day (BID) | ORAL | 0 refills | Status: DC

## 2017-01-06 DIAGNOSIS — M1712 Unilateral primary osteoarthritis, left knee: Secondary | ICD-10-CM | POA: Diagnosis not present

## 2017-01-12 DIAGNOSIS — M11262 Other chondrocalcinosis, left knee: Secondary | ICD-10-CM | POA: Diagnosis not present

## 2017-01-12 DIAGNOSIS — M1712 Unilateral primary osteoarthritis, left knee: Secondary | ICD-10-CM | POA: Diagnosis not present

## 2017-01-21 DIAGNOSIS — M11262 Other chondrocalcinosis, left knee: Secondary | ICD-10-CM | POA: Diagnosis not present

## 2017-01-21 DIAGNOSIS — M1712 Unilateral primary osteoarthritis, left knee: Secondary | ICD-10-CM | POA: Diagnosis not present

## 2017-01-21 DIAGNOSIS — M25562 Pain in left knee: Secondary | ICD-10-CM | POA: Diagnosis not present

## 2017-01-21 DIAGNOSIS — M7632 Iliotibial band syndrome, left leg: Secondary | ICD-10-CM | POA: Diagnosis not present

## 2017-01-23 DIAGNOSIS — M7632 Iliotibial band syndrome, left leg: Secondary | ICD-10-CM | POA: Diagnosis not present

## 2017-01-23 DIAGNOSIS — M25562 Pain in left knee: Secondary | ICD-10-CM | POA: Diagnosis not present

## 2017-01-23 DIAGNOSIS — M1712 Unilateral primary osteoarthritis, left knee: Secondary | ICD-10-CM | POA: Diagnosis not present

## 2017-01-23 DIAGNOSIS — M11262 Other chondrocalcinosis, left knee: Secondary | ICD-10-CM | POA: Diagnosis not present

## 2017-01-26 DIAGNOSIS — M11262 Other chondrocalcinosis, left knee: Secondary | ICD-10-CM | POA: Diagnosis not present

## 2017-01-26 DIAGNOSIS — M25562 Pain in left knee: Secondary | ICD-10-CM | POA: Diagnosis not present

## 2017-01-26 DIAGNOSIS — M7632 Iliotibial band syndrome, left leg: Secondary | ICD-10-CM | POA: Diagnosis not present

## 2017-01-26 DIAGNOSIS — M1712 Unilateral primary osteoarthritis, left knee: Secondary | ICD-10-CM | POA: Diagnosis not present

## 2017-02-03 DIAGNOSIS — M1712 Unilateral primary osteoarthritis, left knee: Secondary | ICD-10-CM | POA: Diagnosis not present

## 2017-02-03 DIAGNOSIS — M7632 Iliotibial band syndrome, left leg: Secondary | ICD-10-CM | POA: Diagnosis not present

## 2017-02-03 DIAGNOSIS — M25562 Pain in left knee: Secondary | ICD-10-CM | POA: Diagnosis not present

## 2017-02-03 DIAGNOSIS — M11262 Other chondrocalcinosis, left knee: Secondary | ICD-10-CM | POA: Diagnosis not present

## 2017-02-07 ENCOUNTER — Other Ambulatory Visit: Payer: Self-pay | Admitting: Neurology

## 2017-02-12 ENCOUNTER — Ambulatory Visit: Payer: Medicare Other | Admitting: Neurology

## 2017-02-12 DIAGNOSIS — M7632 Iliotibial band syndrome, left leg: Secondary | ICD-10-CM | POA: Diagnosis not present

## 2017-02-12 DIAGNOSIS — M25562 Pain in left knee: Secondary | ICD-10-CM | POA: Diagnosis not present

## 2017-02-12 DIAGNOSIS — M1712 Unilateral primary osteoarthritis, left knee: Secondary | ICD-10-CM | POA: Diagnosis not present

## 2017-02-12 DIAGNOSIS — M11262 Other chondrocalcinosis, left knee: Secondary | ICD-10-CM | POA: Diagnosis not present

## 2017-02-13 DIAGNOSIS — Z23 Encounter for immunization: Secondary | ICD-10-CM | POA: Diagnosis not present

## 2017-02-14 ENCOUNTER — Encounter: Payer: Self-pay | Admitting: Internal Medicine

## 2017-02-17 ENCOUNTER — Ambulatory Visit: Payer: Medicare Other | Admitting: Neurology

## 2017-02-23 DIAGNOSIS — M7632 Iliotibial band syndrome, left leg: Secondary | ICD-10-CM | POA: Diagnosis not present

## 2017-02-23 DIAGNOSIS — M25562 Pain in left knee: Secondary | ICD-10-CM | POA: Diagnosis not present

## 2017-02-23 DIAGNOSIS — M11262 Other chondrocalcinosis, left knee: Secondary | ICD-10-CM | POA: Diagnosis not present

## 2017-02-23 DIAGNOSIS — M1712 Unilateral primary osteoarthritis, left knee: Secondary | ICD-10-CM | POA: Diagnosis not present

## 2017-03-04 DIAGNOSIS — M11262 Other chondrocalcinosis, left knee: Secondary | ICD-10-CM | POA: Diagnosis not present

## 2017-03-04 DIAGNOSIS — M25562 Pain in left knee: Secondary | ICD-10-CM | POA: Diagnosis not present

## 2017-03-04 DIAGNOSIS — M7632 Iliotibial band syndrome, left leg: Secondary | ICD-10-CM | POA: Diagnosis not present

## 2017-03-04 DIAGNOSIS — M1712 Unilateral primary osteoarthritis, left knee: Secondary | ICD-10-CM | POA: Diagnosis not present

## 2017-03-30 NOTE — Progress Notes (Signed)
Subjective:    Tiffany Chang was seen in consultation in the movement disorder clinic at the request of Crecencio Mc, MD.  The evaluation is for tremor.  She was previously seen by Dr. Loletta Specter at the [facility] in 2009.  I do have a note from him from May, 2009.  The patient is a 71 y.o. right handed female with a history of tremor.  Tremor began in approximately 9-10 years ago.  She first noted it in her R hand after a long trip to [place].  It is present with activation.  She went to Dr. Loletta Specter and was dx with ET.  She was started on propranolol 20 mg bid and it helped.  It is still helping and it hasn't really progressing.  The tremor is mainly in her R hand.  No tremor in the head or the legs.  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  There is a family hx of tremor due to PD in paternal grandmother and uncle.  Her balance is good.    Affected by caffeine:  no Affected by alcohol:  no Affected by stress:  no Affected by fatigue:  no Spills soup if on spoon:  no but she can notice the shake and has to concentrate if has peas on a spoon Spills glass of liquid if full:  no Affects ADL's (tying shoes, brushing teeth, etc):  no  Current/Previously tried tremor medications: propranolol  Current medications that may exacerbate tremor:  N/a  08/14/15 update:  Pt returns for f/u, accompanied by her husband who supplements the history.  Haven't seen pt in 2 years.  Pt is still on inderal 20 mg bid for tremor.  Reports that she had breast CA since last visit but did good in that regard.  Had mastectomy only.  Had recent mammogram and going to have biopsy on lesion soon.   Tremor has been slowly progressing. Husband states that she is avoiding situations more, and eating more with the L hand.  She is not spilling food.  She has trouble writing.  No trouble with brushing teeth but does shake when she does that.  No speech trouble.  Able to cook without trouble.  Thinks that  the propranolol helps but doesn't know.  R hand is more tremulous than the left.  No tremor in the legs.     11/26/15 update:  The patient is following up today regarding essential tremor.  Last visit, her propranolol was changed from 20 mg twice a day to Inderal LA, 60 mg daily.  She is doing about the same but no SE with the medication.  No new medical problems.    03/11/16 update:  The patient follows up today for her essential tremor.  I increased her Inderal last visit to 40 mg twice a day.  She reports that "I am feeling great but my tremor isn't better with the increase."  She denies any lightheadedness or near syncope.  She just got back from a trip out [place] soon to [place], [place].  06/12/16 update:  Patient follows up today for her essential tremor.  She is on propranolol, 40 mg twice a day.  She was started on primidone, 50 mg daily last visit.  She reports that it is working well, without SE.  It does make her sleepy but she takes it at bedtime.  She moved 2 months ago and has been unpacking and loves her new place.  03/31/17 update:  Pt seen in f/u for ET.  On primidone 50 mg daily and on propranolol 40 mg bid.   She has been well except she had an issue with her L knee in the summer and she didn't know what caused all the pain.  She had an MRI knee which showed torn medial meniscus.  She did PT and it helped.  She cancelled her [place] because of it but she is much better.  Tremor is about the same.    Outside reports reviewed: historical medical records, office notes and referral letter/letters.  Allergies  Allergen Reactions  . Statins Other (See Comments)    Reaction:  Leg cramps   . Bee Venom Hives    Current Outpatient Medications on File Prior to Visit  Medication Sig Dispense Refill  . aspirin EC 81 MG tablet Take 81 mg by mouth daily.    Marland Kitchen CALCIUM PO Take by mouth.    . Cholecalciferol (VITAMIN D3) 2000 UNITS TABS Take 1 tablet by mouth daily.    Marland Kitchen  docusate sodium (COLACE) 100 MG capsule Take 100 mg by mouth 2 (two) times daily.    Marland Kitchen letrozole (FEMARA) 2.5 MG tablet Take 1 tablet (2.5 mg total) by mouth daily. 90 tablet 1  . levothyroxine (SYNTHROID, LEVOTHROID) 88 MCG tablet TAKE 1 TABLET BY MOUTH EVERY DAY 30 tablet 3  . primidone (MYSOLINE) 50 MG tablet Take 1 tablet (50 mg total) by mouth at bedtime. 90 tablet 3  . propranolol (INDERAL) 40 MG tablet TAKE 1 TABLET(40 MG) BY MOUTH TWICE DAILY 180 tablet 0  . doxycycline (VIBRA-TABS) 100 MG tablet Take 1 tablet (100 mg total) by mouth 2 (two) times daily. (Patient not taking: Reported on 03/31/2017) 20 tablet 0   No current facility-administered medications on file prior to visit.     Past Medical History:  Diagnosis Date  . Breast cancer ([place]) 2016   [place] - RT MASTECTOMY  . Hashimoto's thyroiditis   . History of arthroscopy of left knee 1985  . Hyperlipidemia    mild, with prior statin intolerance  . Hypothyroid   . Osteoporosis    prior Evista use x 5 yrs , 2007  . Spinal stenosis    mild  . Tremor   . Viral meningitis    71 years old    Past Surgical History:  Procedure Laterality Date  . CESAREAN SECTION    . CHOLECYSTECTOMY  2006  . FOOT SURGERY     plantar fascitis, Dr. Elvina Mattes  . KNEE ARTHROSCOPY  1985   right  . MASTECTOMY Right 2016   [facility]  . TONSILLECTOMY      Social History   Socioeconomic History  . Marital status: Married    Spouse name: Not on file  . Number of children: Not on file  . Years of education: Not on file  . Highest education level: Not on file  Social Needs  . Financial resource strain: Not on file  . Food insecurity - worry: Not on file  . Food insecurity - inability: Not on file  . Transportation needs - medical: Not on file  . Transportation needs - non-medical: Not on file  Occupational History    Employer: [employer]    Comment: exercise physiologist  Tobacco Use  . Smoking status: Never Smoker    . Smokeless tobacco: Never Used  Substance and Sexual Activity  . Alcohol use: Yes  Alcohol/week: 1.5 oz    Types: 3 Standard drinks or equivalent per week    Comment: social, 2 times per week  . Drug use: No  . Sexual activity: Not Currently  Other Topics Concern  . Not on file  Social History Narrative  . Not on file    Family Status  Relation Name Status  . Mother  Alive       htn, has pacemaker  . Father  Deceased at age 17       AAA  . Sister  Alive       breast cancer  . Sister  Alive  . Sister  Alive  . Son  Alive       melanoma  . Daughter  Alive  . Sister  (Not Specified)  . MGF  (Not Specified)  . MGM  (Not Specified)    Review of Systems Chronic constipation with hemmorhoid.  A complete 10 system ROS was obtained and was negative apart from what is mentioned.   Objective:   VITALS:   Vitals:   03/31/17 1357  BP: 120/78  Pulse: 80  SpO2: 98%  Weight: 168 lb (76.2 kg)  Height: 5\' 8"  (1.727 m)   Wt Readings from Last 3 Encounters:  03/31/17 168 lb (76.2 kg)  10/23/16 162 lb 8 oz (73.7 kg)  08/04/16 165 lb (74.8 kg)     Gen:  Appears stated age and in NAD. HEENT:  Normocephalic, atraumatic. The mucous membranes are moist. The superficial temporal arteries are without ropiness or tenderness. Cardiovascular: Regular rate and rhythm. Lungs: Clear to auscultation bilaterally. Neck: There are no carotid bruits noted bilaterally.  NEUROLOGICAL:  Orientation:  The patient is alert and oriented x 3.   Cranial nerves: There is good facial symmetry.  Extraocular muscles are intact and visual fields are full to confrontational testing. Speech is fluent and clear. Soft palate rises symmetrically and there is no tongue deviation. Hearing is intact to conversational tone. Tone: Tone is good throughout. Sensation: Sensation is intact to light touch  Coordination:  The patient has no dysdiadichokinesia or dysmetria. Motor: Strength is 5/5 in the bilateral  upper and lower extremities.  Shoulder shrug is equal bilaterally.  There is no pronator drift.  There are no fasciculations noted.  Gait and Station: The patient is able to ambulate without difficulty.   MOVEMENT EXAM: Tremor:  There is mild-mod tremor with a weight in the hand on the right but almost no tremor without a weight.  There is intention tremor on the right.    There is virtually no tremor on the L with or without a weight in the hand.  she has difficulty with archimedes spirals on the right.     Assessment/Plan:   1.  Essential tremor  -talked about decreasing the propranolol 40 mg bid.  She was agreeable.  Will decrease to 20 mg bid.  If she does well will continue to wean.  Will have her watch her BP as well to make sure it doesn't rise.    Risks, benefits, side effects and alternative therapies were discussed.  The opportunity to ask questions was given and they were answered to the best of my ability.  The patient expressed understanding and willingness to follow the outlined treatment protocols.  -continue primidone, 50 mg qhs.  Discussed first dose effect.  R/b/se discussed.  -living in [place] and wants to continue care here.   2.  Follow up is anticipated in the next  6 months, sooner should new neurologic issues arise.

## 2017-03-31 ENCOUNTER — Encounter: Payer: Self-pay | Admitting: Neurology

## 2017-03-31 ENCOUNTER — Ambulatory Visit (INDEPENDENT_AMBULATORY_CARE_PROVIDER_SITE_OTHER): Payer: Medicare Other | Admitting: Neurology

## 2017-03-31 VITALS — BP 120/78 | HR 80 | Ht 68.0 in | Wt 168.0 lb

## 2017-03-31 DIAGNOSIS — G250 Essential tremor: Secondary | ICD-10-CM

## 2017-03-31 NOTE — Patient Instructions (Signed)
Decrease propranolol to 40 mg, 1/2 tablet twice per day.  We will call you in 3 weeks to see how you are doing and we will decide what to do from there

## 2017-04-22 NOTE — Progress Notes (Signed)
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  INTERVAL HISTORY: Patient returns to clinic today for further evaluation and routine 6 month follow-up. She continues to tolerate letrozole well without significant side effects. She currently feels well and is asymptomatic. She has no neurologic complaints. She denies any fevers. She has a good appetite and denies weight loss. She has no chest pain or shortness of breath. She denies any nausea, vomiting, constipation, or diarrhea. She has no urinary complaints. Patient offers no specific complaints today.  REVIEW OF SYSTEMS:   Review of Systems  Constitutional: Negative.  Negative for fever, malaise/fatigue and weight loss.  Respiratory: Negative.  Negative for cough and shortness of breath.   Cardiovascular: Negative.  Negative for chest pain and leg swelling.  Gastrointestinal: Negative.  Negative for abdominal pain.  Musculoskeletal: Negative.   Skin: Negative.  Negative for rash.  Neurological: Negative.  Negative for sensory change and weakness.  Psychiatric/Behavioral: Negative.  The patient is not nervous/anxious.     As per HPI. Otherwise, a complete review of systems is negative.  PAST MEDICAL HISTORY: Past Medical History:  Diagnosis Date  . Breast cancer ([place]) 2016   [place] - RT MASTECTOMY  . Hashimoto's thyroiditis   . History of arthroscopy of left knee 1985  . Hyperlipidemia    mild, with prior statin intolerance  . Hypothyroid   . Osteoporosis    prior Evista use x 5 yrs , 2007  . Spinal stenosis    mild  . Tremor   . Viral meningitis    71 years old    PAST SURGICAL HISTORY: Past Surgical History:    Procedure Laterality Date  . CESAREAN SECTION    . CHOLECYSTECTOMY  2006  . FOOT SURGERY     plantar fascitis, Dr. Elvina Mattes  . KNEE ARTHROSCOPY  1985   right  . MASTECTOMY Right 2016   [facility]  . SENTINEL NODE BIOPSY Right 10/10/2014   Procedure: SENTINEL NODE BIOPSY;  Surgeon: Leonie Green, MD;  Location: [facility] ORS;  Service: General;  Laterality: Right;  . SIMPLE MASTECTOMY WITH AXILLARY SENTINEL NODE BIOPSY Right 10/10/2014   Procedure: SIMPLE MASTECTOMY;  Surgeon: Leonie Green, MD;  Location: [facility] ORS;  Service: General;  Laterality: Right;  . TONSILLECTOMY      FAMILY HISTORY Family History  Problem Relation Age of Onset  . Osteoporosis Mother   . Aortic aneurysm Father   . Cancer Sister 65       Breast  . Breast cancer Sister 78  . Cancer Maternal Grandfather        breast  . Breast cancer Maternal Grandmother 86       ADVANCED DIRECTIVES:    HEALTH MAINTENANCE: Social History   Tobacco Use  . Smoking status: Never Smoker  . Smokeless tobacco: Never Used  Substance Use Topics  . Alcohol use: Yes    Alcohol/week: 1.5 oz    Types: 3 Standard drinks or equivalent per week    Comment: social, 2 times per week  . Drug use: No     Colonoscopy:  PAP:  Bone density:  Lipid panel:  Allergies  Allergen Reactions  . Statins Other (See Comments)    Reaction:  Leg cramps   . Bee Venom Hives    Current Outpatient Medications  Medication Sig Dispense Refill  . aspirin EC 81 MG tablet Take 81 mg by mouth daily.    Marland Kitchen CALCIUM PO Take by mouth.    . Cholecalciferol (VITAMIN D3) 2000 UNITS TABS Take 1 tablet by mouth daily.    Marland Kitchen docusate sodium (COLACE) 100 MG capsule Take 100 mg by mouth 2 (two) times daily.    Marland Kitchen letrozole (FEMARA) 2.5 MG tablet Take 1 tablet (2.5 mg total) by mouth daily. 90 tablet 3  . levothyroxine (SYNTHROID, LEVOTHROID) 88 MCG tablet TAKE 1 TABLET BY MOUTH EVERY DAY 30 tablet 3  . primidone (MYSOLINE) 50 MG tablet Take 1 tablet (50  mg total) by mouth at bedtime. 90 tablet 3  . propranolol (INDERAL) 40 MG tablet TAKE 1 TABLET(40 MG) BY MOUTH TWICE DAILY (Patient taking differently: TAKE 1 TABLET(20 MG) BY MOUTH TWICE DAILY) 180 tablet 0  . doxycycline (VIBRA-TABS) 100 MG tablet Take 1 tablet (100 mg total) by mouth 2 (two) times daily. (Patient not taking: Reported on 03/31/2017) 20 tablet 0   No current facility-administered medications for this visit.     OBJECTIVE: Vitals:   04/23/17 1416  BP: 111/72  Pulse: 79  Resp: 18  Temp: 98.1 F (36.7 C)     Body mass index is 25.47 kg/m.    ECOG FS:0 - Asymptomatic  General: Well-developed, well-nourished, no acute distress. Eyes: Anicteric sclera. Breasts: Exam deferred today. Patient has a right mastectomy. Lungs: Clear to auscultation bilaterally. Heart: Regular rate and rhythm. No rubs, murmurs, or gallops. Abdomen: Soft, nontender, nondistended. No organomegaly noted, normoactive bowel sounds. Musculoskeletal: No edema, cyanosis, or clubbing. Neuro: Alert, answering all questions appropriately. Cranial nerves grossly intact. Skin: No rashes or petechiae noted. Psych: Normal affect.   LAB RESULTS:  Lab Results  Component Value Date   NA 141 08/05/2016   K 5.1 08/05/2016   CL 105 08/05/2016   CO2 30 08/05/2016   GLUCOSE 109 (H) 08/05/2016   BUN 18 08/05/2016   CREATININE 0.75 08/05/2016   CALCIUM 9.6 08/05/2016   PROT 6.4 08/05/2016   ALBUMIN 4.0 08/05/2016   AST 10 08/05/2016   ALT 11 08/05/2016   ALKPHOS 83 08/05/2016   BILITOT 0.4 08/05/2016   GFRNONAA >60 10/03/2014   GFRAA >60 10/03/2014    Lab Results  Component Value Date   WBC 5.7 08/05/2016   NEUTROABS 3.8 08/05/2016   HGB 13.5 08/05/2016   HCT 40.8 08/05/2016   MCV 88.5 08/05/2016   PLT 335.0 08/05/2016     STUDIES: No results found.  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  PLAN:     1. 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 Given the fact that her malignancy is lobular in nature, she did not require chemotherapy. Continue letrozole for 5 years completing in June 2021.  Her most recent mammogram on Sep 24, 2016 was reported as BiRADS 1. No further intervention is needed at this time. Return to clinic in 6 months with repeat laboratory work and further evaluation.  2. Osteopenia: Bone mineral density on October 23, 2016 revealed a T score of -1.9 which is essentially unchanged from one year prior. Repeat bone mineral density in  June 2019. 3. Left breast abnormality: Patient had breast biopsy on August 21, 2015 that was negative for malignancy. Continue mammograms as above.    Approximately 20 minutes was spent in discussion of which greater than 50% was consultation.  Patient expressed understanding and was in agreement with this plan. She also understands that She can call clinic at any time with any questions, concerns, or complaints.    Lloyd Huger, MD   04/26/2017 8:09 AM

## 2017-04-23 ENCOUNTER — Inpatient Hospital Stay: Payer: Medicare Other | Attending: Oncology | Admitting: Oncology

## 2017-04-23 VITALS — BP 111/72 | HR 79 | Temp 98.1°F | Resp 18 | Wt 167.5 lb

## 2017-04-23 DIAGNOSIS — E063 Autoimmune thyroiditis: Secondary | ICD-10-CM | POA: Diagnosis not present

## 2017-04-23 DIAGNOSIS — Z7982 Long term (current) use of aspirin: Secondary | ICD-10-CM | POA: Insufficient documentation

## 2017-04-23 DIAGNOSIS — C50411 Malignant neoplasm of upper-outer quadrant of right female breast: Secondary | ICD-10-CM | POA: Insufficient documentation

## 2017-04-23 DIAGNOSIS — E785 Hyperlipidemia, unspecified: Secondary | ICD-10-CM | POA: Insufficient documentation

## 2017-04-23 DIAGNOSIS — Z79899 Other long term (current) drug therapy: Secondary | ICD-10-CM | POA: Diagnosis not present

## 2017-04-23 DIAGNOSIS — Z79811 Long term (current) use of aromatase inhibitors: Secondary | ICD-10-CM | POA: Diagnosis not present

## 2017-04-23 DIAGNOSIS — M8580 Other specified disorders of bone density and structure, unspecified site: Secondary | ICD-10-CM | POA: Diagnosis not present

## 2017-04-23 DIAGNOSIS — Z170 Estrogen receptor positive status [ER+]: Secondary | ICD-10-CM | POA: Diagnosis not present

## 2017-04-23 DIAGNOSIS — Z9011 Acquired absence of right breast and nipple: Secondary | ICD-10-CM | POA: Diagnosis not present

## 2017-04-23 MED ORDER — LETROZOLE 2.5 MG PO TABS: 2.5000 mg | ORAL_TABLET | Freq: Every day | ORAL | 3 refills | Status: DC

## 2017-04-24 ENCOUNTER — Telehealth: Payer: Self-pay | Admitting: Neurology

## 2017-04-24 NOTE — Telephone Encounter (Signed)
She wasn't sure if you wanted her to go back up on it, or try 1 in the morning, 1/2 at night or any other combination?

## 2017-04-24 NOTE — Telephone Encounter (Signed)
Spoke with patient to see how she was doing after decreasing propranolol to 40 mg, to 1/2 tablet twice per day. She states tremors are noticeably increased a little. Please advise.   She asked that I send instructions through mychart.

## 2017-04-24 NOTE — Telephone Encounter (Signed)
Does she want to go back up on it?

## 2017-04-25 ENCOUNTER — Other Ambulatory Visit: Payer: Self-pay | Admitting: Internal Medicine

## 2017-04-28 NOTE — Telephone Encounter (Signed)
Yes.  Try that and see how she is doing in a month or so

## 2017-04-29 ENCOUNTER — Encounter: Payer: Self-pay | Admitting: Neurology

## 2017-04-29 ENCOUNTER — Encounter: Payer: Self-pay | Admitting: Internal Medicine

## 2017-04-29 NOTE — Telephone Encounter (Signed)
Mychart message sent to patient.

## 2017-05-07 ENCOUNTER — Other Ambulatory Visit: Payer: Self-pay | Admitting: Neurology

## 2017-05-07 MED ORDER — PROPRANOLOL HCL 40 MG PO TABS: ORAL_TABLET | ORAL | 0 refills | Status: DC

## 2017-05-22 ENCOUNTER — Encounter: Payer: Self-pay | Admitting: Internal Medicine

## 2017-05-28 ENCOUNTER — Telehealth: Payer: Self-pay | Admitting: Neurology

## 2017-05-28 NOTE — Telephone Encounter (Signed)
Mychart message sent to patient checking to see how she is doing on medication.

## 2017-07-27 ENCOUNTER — Encounter: Payer: Self-pay | Admitting: Internal Medicine

## 2017-08-02 ENCOUNTER — Other Ambulatory Visit: Payer: Self-pay | Admitting: Neurology

## 2017-08-05 ENCOUNTER — Ambulatory Visit (INDEPENDENT_AMBULATORY_CARE_PROVIDER_SITE_OTHER): Payer: Medicare Other

## 2017-08-05 ENCOUNTER — Ambulatory Visit (INDEPENDENT_AMBULATORY_CARE_PROVIDER_SITE_OTHER): Payer: Medicare Other | Admitting: Internal Medicine

## 2017-08-05 ENCOUNTER — Encounter: Payer: Self-pay | Admitting: Internal Medicine

## 2017-08-05 VITALS — BP 120/84 | HR 71 | Temp 98.2°F | Resp 14 | Ht 68.0 in | Wt 164.0 lb

## 2017-08-05 DIAGNOSIS — Z0000 Encounter for general adult medical examination without abnormal findings: Secondary | ICD-10-CM

## 2017-08-05 DIAGNOSIS — Z170 Estrogen receptor positive status [ER+]: Secondary | ICD-10-CM

## 2017-08-05 DIAGNOSIS — E785 Hyperlipidemia, unspecified: Secondary | ICD-10-CM | POA: Diagnosis not present

## 2017-08-05 DIAGNOSIS — G250 Essential tremor: Secondary | ICD-10-CM | POA: Diagnosis not present

## 2017-08-05 DIAGNOSIS — C50411 Malignant neoplasm of upper-outer quadrant of right female breast: Secondary | ICD-10-CM

## 2017-08-05 DIAGNOSIS — E039 Hypothyroidism, unspecified: Secondary | ICD-10-CM | POA: Diagnosis not present

## 2017-08-05 DIAGNOSIS — R7303 Prediabetes: Secondary | ICD-10-CM | POA: Diagnosis not present

## 2017-08-05 DIAGNOSIS — M8580 Other specified disorders of bone density and structure, unspecified site: Secondary | ICD-10-CM

## 2017-08-05 DIAGNOSIS — E559 Vitamin D deficiency, unspecified: Secondary | ICD-10-CM

## 2017-08-05 DIAGNOSIS — H2513 Age-related nuclear cataract, bilateral: Secondary | ICD-10-CM | POA: Diagnosis not present

## 2017-08-05 LAB — COMPREHENSIVE METABOLIC PANEL
ALT: 14 U/L (ref 0–35)
AST: 14 U/L (ref 0–37)
Albumin: 4.7 g/dL (ref 3.5–5.2)
Alkaline Phosphatase: 86 U/L (ref 39–117)
BUN: 19 mg/dL (ref 6–23)
CO2: 27 mEq/L (ref 19–32)
Calcium: 10.2 mg/dL (ref 8.4–10.5)
Chloride: 106 mEq/L (ref 96–112)
Creatinine, Ser: 0.74 mg/dL (ref 0.40–1.20)
GFR: 82.58 mL/min (ref >60.00)
Glucose, Bld: 113 mg/dL — ABNORMAL HIGH (ref 70–99)
Potassium: 5.3 mEq/L — ABNORMAL HIGH (ref 3.5–5.1)
Sodium: 143 mEq/L (ref 135–145)
Total Bilirubin: 0.2 mg/dL (ref 0.2–1.2)
Total Protein: 7.4 g/dL (ref 6.0–8.3)

## 2017-08-05 LAB — LIPID PANEL
Cholesterol: 298 mg/dL — ABNORMAL HIGH (ref 0–200)
HDL: 42.5 mg/dL (ref >39.00)
NonHDL: 255.98
Total CHOL/HDL Ratio: 7
Triglycerides: 213 mg/dL — ABNORMAL HIGH (ref 0.0–149.0)
VLDL: 42.6 mg/dL — ABNORMAL HIGH (ref 0.0–40.0)

## 2017-08-05 LAB — HEMOGLOBIN A1C: Hgb A1c MFr Bld: 5.9 % (ref 4.6–6.5)

## 2017-08-05 LAB — VITAMIN D 25 HYDROXY (VIT D DEFICIENCY, FRACTURES): VITD: 21.56 ng/mL — ABNORMAL LOW (ref 30.00–100.00)

## 2017-08-05 LAB — LDL CHOLESTEROL, DIRECT: Direct LDL: 218 mg/dL

## 2017-08-05 LAB — TSH: TSH: 1.38 u[IU]/mL (ref 0.35–4.50)

## 2017-08-05 MED ORDER — DOXYCYCLINE HYCLATE 100 MG PO TABS: 100.0000 mg | ORAL_TABLET | Freq: Two times a day (BID) | ORAL | 2 refills | Status: DC

## 2017-08-05 MED ORDER — MELOXICAM 15 MG PO TABS: 15.0000 mg | ORAL_TABLET | Freq: Every day | ORAL | 3 refills | Status: DC

## 2017-08-05 NOTE — Patient Instructions (Signed)
I will make a referral to Dr Ubaldo Glassing to discuss the results of your Coronary Calcium CT   Doxycycline and meloxicam have been refilled    Health Maintenance for Postmenopausal Women Menopause is a normal process in which your reproductive ability comes to an end. This process happens gradually over a span of months to years, usually between the ages of 51 and 52. Menopause is complete when you have missed 12 consecutive menstrual periods. It is important to talk with your health care provider about some of the most common conditions that affect postmenopausal women, such as heart disease, cancer, and bone loss (osteoporosis). Adopting a healthy lifestyle and getting preventive care can help to promote your health and wellness. Those actions can also lower your chances of developing some of these common conditions. What should I know about menopause? During menopause, you may experience a number of symptoms, such as:  Moderate-to-severe hot flashes.  Night sweats.  Decrease in sex drive.  Mood swings.  Headaches.  Tiredness.  Irritability.  Memory problems.  Insomnia.  Choosing to treat or not to treat menopausal changes is an individual decision that you make with your health care provider. What should I know about hormone replacement therapy and supplements? Hormone therapy products are effective for treating symptoms that are associated with menopause, such as hot flashes and night sweats. Hormone replacement carries certain risks, especially as you become older. If you are thinking about using estrogen or estrogen with progestin treatments, discuss the benefits and risks with your health care provider. What should I know about heart disease and stroke? Heart disease, heart attack, and stroke become more likely as you age. This may be due, in part, to the hormonal changes that your body experiences during menopause. These can affect how your body processes dietary fats, triglycerides,  and cholesterol. Heart attack and stroke are both medical emergencies. There are many things that you can do to help prevent heart disease and stroke:  Have your blood pressure checked at least every 1-2 years. High blood pressure causes heart disease and increases the risk of stroke.  If you are 6-41 years old, ask your health care provider if you should take aspirin to prevent a heart attack or a stroke.  Do not use any tobacco products, including cigarettes, chewing tobacco, or electronic cigarettes. If you need help quitting, ask your health care provider.  It is important to eat a healthy diet and maintain a healthy weight. ? Be sure to include plenty of vegetables, fruits, low-fat dairy products, and lean protein. ? Avoid eating foods that are high in solid fats, added sugars, or salt (sodium).  Get regular exercise. This is one of the most important things that you can do for your health. ? Try to exercise for at least 150 minutes each week. The type of exercise that you do should increase your heart rate and make you sweat. This is known as moderate-intensity exercise. ? Try to do strengthening exercises at least twice each week. Do these in addition to the moderate-intensity exercise.  Know your numbers.Ask your health care provider to check your cholesterol and your blood glucose. Continue to have your blood tested as directed by your health care provider.  What should I know about cancer screening? There are several types of cancer. Take the following steps to reduce your risk and to catch any cancer development as early as possible. Breast Cancer  Practice breast self-awareness. ? This means understanding how your breasts normally appear  and feel. ? It also means doing regular breast self-exams. Let your health care provider know about any changes, no matter how small.  If you are 54 or older, have a clinician do a breast exam (clinical breast exam or CBE) every year.  Depending on your age, family history, and medical history, it may be recommended that you also have a yearly breast X-ray (mammogram).  If you have a family history of breast cancer, talk with your health care provider about genetic screening.  If you are at high risk for breast cancer, talk with your health care provider about having an MRI and a mammogram every year.  Breast cancer (BRCA) gene test is recommended for women who have family members with BRCA-related cancers. Results of the assessment will determine the need for genetic counseling and BRCA1 and for BRCA2 testing. BRCA-related cancers include these types: ? Breast. This occurs in males or females. ? Ovarian. ? Tubal. This may also be called fallopian tube cancer. ? Cancer of the abdominal or pelvic lining (peritoneal cancer). ? Prostate. ? Pancreatic.  Cervical, Uterine, and Ovarian Cancer Your health care provider may recommend that you be screened regularly for cancer of the pelvic organs. These include your ovaries, uterus, and vagina. This screening involves a pelvic exam, which includes checking for microscopic changes to the surface of your cervix (Pap test).  For women ages 21-65, health care providers may recommend a pelvic exam and a Pap test every three years. For women ages 24-65, they may recommend the Pap test and pelvic exam, combined with testing for human papilloma virus (HPV), every five years. Some types of HPV increase your risk of cervical cancer. Testing for HPV may also be done on women of any age who have unclear Pap test results.  Other health care providers may not recommend any screening for nonpregnant women who are considered low risk for pelvic cancer and have no symptoms. Ask your health care provider if a screening pelvic exam is right for you.  If you have had past treatment for cervical cancer or a condition that could lead to cancer, you need Pap tests and screening for cancer for at least 20  years after your treatment. If Pap tests have been discontinued for you, your risk factors (such as having a new sexual partner) need to be reassessed to determine if you should start having screenings again. Some women have medical problems that increase the chance of getting cervical cancer. In these cases, your health care provider may recommend that you have screening and Pap tests more often.  If you have a family history of uterine cancer or ovarian cancer, talk with your health care provider about genetic screening.  If you have vaginal bleeding after reaching menopause, tell your health care provider.  There are currently no reliable tests available to screen for ovarian cancer.  Lung Cancer Lung cancer screening is recommended for adults 31-26 years old who are at high risk for lung cancer because of a history of smoking. A yearly low-dose CT scan of the lungs is recommended if you:  Currently smoke.  Have a history of at least 30 pack-years of smoking and you currently smoke or have quit within the past 15 years. A pack-year is smoking an average of one pack of cigarettes per day for one year.  Yearly screening should:  Continue until it has been 15 years since you quit.  Stop if you develop a health problem that would prevent you from  having lung cancer treatment.  Colorectal Cancer  This type of cancer can be detected and can often be prevented.  Routine colorectal cancer screening usually begins at age 64 and continues through age 77.  If you have risk factors for colon cancer, your health care provider may recommend that you be screened at an earlier age.  If you have a family history of colorectal cancer, talk with your health care provider about genetic screening.  Your health care provider may also recommend using home test kits to check for hidden blood in your stool.  A small camera at the end of a tube can be used to examine your colon directly (sigmoidoscopy or  colonoscopy). This is done to check for the earliest forms of colorectal cancer.  Direct examination of the colon should be repeated every 5-10 years until age 23. However, if early forms of precancerous polyps or small growths are found or if you have a family history or genetic risk for colorectal cancer, you may need to be screened more often.  Skin Cancer  Check your skin from head to toe regularly.  Monitor any moles. Be sure to tell your health care provider: ? About any new moles or changes in moles, especially if there is a change in a mole's shape or color. ? If you have a mole that is larger than the size of a pencil eraser.  If any of your family members has a history of skin cancer, especially at a young age, talk with your health care provider about genetic screening.  Always use sunscreen. Apply sunscreen liberally and repeatedly throughout the day.  Whenever you are outside, protect yourself by wearing long sleeves, pants, a wide-brimmed hat, and sunglasses.  What should I know about osteoporosis? Osteoporosis is a condition in which bone destruction happens more quickly than new bone creation. After menopause, you may be at an increased risk for osteoporosis. To help prevent osteoporosis or the bone fractures that can happen because of osteoporosis, the following is recommended:  If you are 66-36 years old, get at least 1,000 mg of calcium and at least 600 mg of vitamin D per day.  If you are older than age 38 but younger than age 11, get at least 1,200 mg of calcium and at least 600 mg of vitamin D per day.  If you are older than age 21, get at least 1,200 mg of calcium and at least 800 mg of vitamin D per day.  Smoking and excessive alcohol intake increase the risk of osteoporosis. Eat foods that are rich in calcium and vitamin D, and do weight-bearing exercises several times each week as directed by your health care provider. What should I know about how menopause  affects my mental health? Depression may occur at any age, but it is more common as you become older. Common symptoms of depression include:  Low or sad mood.  Changes in sleep patterns.  Changes in appetite or eating patterns.  Feeling an overall lack of motivation or enjoyment of activities that you previously enjoyed.  Frequent crying spells.  Talk with your health care provider if you think that you are experiencing depression. What should I know about immunizations? It is important that you get and maintain your immunizations. These include:  Tetanus, diphtheria, and pertussis (Tdap) booster vaccine.  Influenza every year before the flu season begins.  Pneumonia vaccine.  Shingles vaccine.  Your health care provider may also recommend other immunizations. This information is not  intended to replace advice given to you by your health care provider. Make sure you discuss any questions you have with your health care provider. Document Released: 06/27/2005 Document Revised: 11/23/2015 Document Reviewed: 02/06/2015 Elsevier Interactive Patient Education  2018 [employer].

## 2017-08-05 NOTE — Progress Notes (Addendum)
Subjective:   Lyndel Dancel is a 72 y.o. female who presents for Medicare Annual/Subsequent preventive examination.  Review of Systems:  No ROS.  Medicare Wellness Visit. Additional risk factors are reflected in the social history. Cardiac Risk Factors include: advanced age (>30men, >3 women)     Objective:    Vitals: BP 120/84 (BP Location: Left Arm, Patient Position: Sitting, Cuff Size: Normal)   Pulse 71   Temp 98.2 F (36.8 C) (Oral)   Resp 14   Ht 5\' 8"  (1.727 m)   Wt 164 lb (74.4 kg)   SpO2 97%   BMI 24.94 kg/m   Body mass index is 24.94 kg/m.  Advanced Directives 08/05/2017 04/23/2017 10/23/2016 08/04/2016 08/08/2015 01/31/2015 10/10/2014  Does Patient Have a Medical Advance Directive? Yes Yes Yes Yes Yes Yes Yes  Type of Paramedic of [place];Living will Living will;Healthcare Power of [place];Living will [place];Living will [place];Living will - -  Does patient want to make changes to medical advance directive? No - Patient declined - - No - Patient declined No - Patient declined No - Patient declined -  Copy of [place] in Chart? No - copy requested - - No - copy requested No - copy requested - -    Tobacco Social History   Tobacco Use  Smoking Status Never Smoker  Smokeless Tobacco Never Used     Counseling given: Not Answered   Clinical Intake:  Pre-visit preparation completed: Yes  Pain : No/denies pain     Nutritional Status: BMI 25 -29 Overweight Diabetes: No  How often do you need to have someone help you when you read instructions, pamphlets, or other written materials from your doctor or pharmacy?: 1 - Never  Interpreter Needed?: No     Past Medical History:  Diagnosis Date  . Breast cancer ([place]) 2016   [place] - RT MASTECTOMY  . Hashimoto's thyroiditis   . History of arthroscopy of left knee 1985  . Hyperlipidemia    mild, with  prior statin intolerance  . Hypothyroid   . Osteoporosis    prior Evista use x 5 yrs , 2007  . Spinal stenosis    mild  . Tremor   . Viral meningitis    72 years old   Past Surgical History:  Procedure Laterality Date  . CESAREAN SECTION    . CHOLECYSTECTOMY  2006  . FOOT SURGERY     plantar fascitis, Dr. Elvina Mattes  . KNEE ARTHROSCOPY  1985   right  . MASTECTOMY Right 2016   [facility]  . SENTINEL NODE BIOPSY Right 10/10/2014   Procedure: SENTINEL NODE BIOPSY;  Surgeon: Leonie Green, MD;  Location: [facility] ORS;  Service: General;  Laterality: Right;  . SIMPLE MASTECTOMY WITH AXILLARY SENTINEL NODE BIOPSY Right 10/10/2014   Procedure: SIMPLE MASTECTOMY;  Surgeon: Leonie Green, MD;  Location: [facility] ORS;  Service: General;  Laterality: Right;  . TONSILLECTOMY     Family History  Problem Relation Age of Onset  . Osteoporosis Mother   . Aortic aneurysm Father   . Cancer Sister 49       Breast  . Breast cancer Sister 37  . Cancer Maternal Grandfather        breast  . Breast cancer Maternal Grandmother 86   Social History   Socioeconomic History  . Marital status: Married    Spouse name: None  . Number of children:  None  . Years of education: None  . Highest education level: None  Social Needs  . Financial resource strain: None  . Food insecurity - worry: Never true  . Food insecurity - inability: Never true  . Transportation needs - medical: No  . Transportation needs - non-medical: No  Occupational History    Employer: [employer]    Comment: exercise physiologist  Tobacco Use  . Smoking status: Never Smoker  . Smokeless tobacco: Never Used  Substance and Sexual Activity  . Alcohol use: Yes    Alcohol/week: 1.5 oz    Types: 3 Standard drinks or equivalent per week    Comment: social, 2 times per week  . Drug use: No  . Sexual activity: Not Currently  Other Topics Concern  . None  Social History Narrative  . None    Outpatient  Encounter Medications as of 08/05/2017  Medication Sig  . aspirin EC 81 MG tablet Take 81 mg by mouth daily.  Marland Kitchen CALCIUM PO Take by mouth.  . Cholecalciferol (VITAMIN D3) 2000 UNITS TABS Take 1 tablet by mouth daily.  Marland Kitchen docusate sodium (COLACE) 100 MG capsule Take 100 mg by mouth 2 (two) times daily.  Marland Kitchen doxycycline (VIBRA-TABS) 100 MG tablet Take 1 tablet (100 mg total) by mouth 2 (two) times daily.  Marland Kitchen letrozole (FEMARA) 2.5 MG tablet Take 1 tablet (2.5 mg total) by mouth daily.  Marland Kitchen levothyroxine (SYNTHROID, LEVOTHROID) 88 MCG tablet TAKE 1 TABLET BY MOUTH EVERY DAY  . meloxicam (MOBIC) 15 MG tablet Take 1 tablet (15 mg total) by mouth daily.  . primidone (MYSOLINE) 50 MG tablet Take 1 tablet (50 mg total) by mouth at bedtime.  . propranolol (INDERAL) 40 MG tablet Take 1 tablet (40 mg total) by mouth 2 (two) times daily.   No facility-administered encounter medications on file as of 08/05/2017.     Activities of Daily Living In your present state of health, do you have any difficulty performing the following activities: 08/05/2017  Hearing? N  Vision? N  Difficulty concentrating or making decisions? N  Walking or climbing stairs? N  Dressing or bathing? N  Doing errands, shopping? N  Preparing Food and eating ? N  Using the Toilet? N  In the past six months, have you accidently leaked urine? N  Do you have problems with loss of bowel control? N  Managing your Medications? N  Managing your Finances? N  Housekeeping or managing your Housekeeping? N  Some recent data might be hidden    Patient Care Team: Crecencio Mc, MD as PCP - General (Internal Medicine)   Assessment:   This is a routine wellness examination for Abel.  The goal of the wellness visit is to assist the patient how to close the gaps in care and create a preventative care plan for the patient.   The roster of all physicians providing medical care to patient is listed in the Snapshot section of the chart.  Taking  calcium VIT D as appropriate/Osteoporosis reviewed.    Safety issues reviewed; Smoke and carbon monoxide detectors in the home. No firearms or firearms locked in a safe within the home. Wears seatbelts when driving or riding with others. No violence in the home.  They do not have excessive sun exposure.  Discussed the need for sun protection: hats, long sleeves and the use of sunscreen if there is significant sun exposure.  Patient is alert, normal appearance, oriented to person/place/and  time.  Correctly identified the president of the [place] and recalls of 3/3 words. Performs simple calculations and can read correct time from watch face. Displays appropriate judgement.  No new identified risk were noted.  No failures at ADL's or IADL's.    BMI- discussed the importance of a healthy diet, water intake and the benefits of aerobic exercise. She has a healthy diet, walks as tolerated while doing home physical therapy exercises and has  adequate water intake.     Dental- every 6 months.  Eye- Visual acuity not assessed per patient preference since they have regular follow up with the ophthalmologist.  Wears corrective lenses.  Last OV performed today.  Sleep patterns- Sleeps through the night with hours at night.  Wakes feeling rested.  Health maintenance gaps- closed.  Patient Concerns: None at this time. Follow up with PCP as needed.  Exercise Activities and Dietary recommendations Current Exercise Habits: Home exercise routine, Type of exercise: calisthenics;stretching;walking(Swimming. Home PT exercises.)  Goals    . Maintain Healthy Lifestyle     Exercise as tolerated Healthy diet Stay hydrated        Fall Risk Fall Risk  08/05/2017 03/31/2017 08/04/2016 06/12/2016 03/11/2016  Falls in the past year? No No No No No   Depression Screen PHQ 2/9 Scores 08/05/2017 08/04/2016 07/25/2016 07/11/2013  PHQ - 2 Score 0 0 0 0  PHQ- 9 Score 0 - - -    Cognitive Function MMSE - Mini  Mental State Exam 08/05/2017  Orientation to time 5  Orientation to Place 5  Registration 3  Attention/ Calculation 5  Recall 3  Language- name 2 objects 2  Language- repeat 1  Language- follow 3 step command 3  Language- read & follow direction 1  Write a sentence 0  Write a sentence-comments Did not ask to complete; tremor  Copy design 0  Copy design-comments Did not ask to complete; tremor  Total score 28     6CIT Screen 08/04/2016  What Year? 0 points  What month? 0 points  What time? 0 points  Count back from 20 0 points  Months in reverse 0 points  Repeat phrase 0 points  Total Score 0    Immunization History  Administered Date(s) Administered  . Influenza Split 02/17/2012, 02/16/2013  . Influenza-Unspecified 02/16/2014, 02/17/2015, 03/06/2016, 02/13/2017  . Pneumococcal Conjugate-13 07/11/2013  . Pneumococcal Polysaccharide-23 08/04/2016  . Tdap 06/29/2012  . Zoster 06/25/2013   Screening Tests Health Maintenance  Topic Date Due  . MAMMOGRAM  09/25/2018  . TETANUS/TDAP  06/29/2022  . COLONOSCOPY  07/27/2022  . INFLUENZA VACCINE  Completed  . DEXA SCAN  Completed  . Hepatitis C Screening  Completed  . PNA vac Low Risk Adult  Completed   Plan:    End of life planning; Advance aging; Advanced directives discussed. Copy of current HCPOA/Living Will requested.    I have personally reviewed and noted the following in the patient's chart:   . Medical and social history . Use of alcohol, tobacco or illicit drugs  . Current medications and supplements . Functional ability and status . Nutritional status . Physical activity . Advanced directives . List of other physicians . Hospitalizations, surgeries, and ER visits in previous 12 months . Vitals . Screenings to include cognitive, depression, and falls . Referrals and appointments  In addition, I have reviewed and discussed with patient certain preventive protocols, quality metrics, and best practice  recommendations. A written personalized care plan for preventive services as well  as general preventive health recommendations were provided to patient.     OBrien-Blaney, Stephene Alegria L, LPN  01/18/1114   I have reviewed the above information and agree with above.   Deborra Medina, MD

## 2017-08-05 NOTE — Progress Notes (Signed)
Patient ID: Tiffany Chang, female    DOB: 1945-09-27  Age: 73 y.o. MRN: [id number]  The patient is here for examination and management of other chronic and acute problems.  Health Maintenance :  Colon ca screen:  2014 sessile polyp no path report.  Elliott dexa  and mammograam ordered in June    The risk factors are reflected in the social history.  The roster of all physicians providing medical care to patient - is listed in the Snapshot section of the chart.  Activities of daily living:  The patient is 100% independent in all ADLs: dressing, toileting, feeding as well as independent mobility  Home safety : The patient has smoke detectors in the home. They wear seatbelts.  There are no firearms at home. There is no violence in the home.   There is no risks for hepatitis, STDs or HIV. There is no   history of blood transfusion. They have no travel history to infectious disease endemic areas of the world.  The patient has seen their dentist in the last six month. They have seen their eye doctor in the last year. They admit to slight hearing difficulty with regard to whispered voices and some television programs.  They have deferred audiologic testing in the last year.  They do not  have excessive sun exposure. Discussed the need for sun protection: hats, long sleeves and use of sunscreen if there is significant sun exposure.   Diet: the importance of a healthy diet is discussed. They do have a healthy diet.  The benefits of regular aerobic exercise were discussed. She walks 4 times per week ,  20 minutes.   Depression screen: there are no signs or vegative symptoms of depression- irritability, change in appetite, anhedonia, sadness/tearfullness.  Cognitive assessment: the patient manages all their financial and personal affairs and is actively engaged. They could relate day,date,year and events; recalled 2/3 objects at 3 minutes; performed clock-face test normally.  The following portions of  the patient's history were reviewed and updated as appropriate: allergies, current medications, past family history, past medical history,  past surgical history, past social history  and problem list.  Visual acuity was not assessed per patient preference since she has regular follow up with her ophthalmologist. Hearing and body mass index were assessed and reviewed.   During the course of the visit the patient was educated and counseled about appropriate screening and preventive services including : fall prevention , diabetes screening, nutrition counseling, colorectal cancer screening, and recommended immunizations.    CC: The primary encounter diagnosis was Acquired hypothyroidism. Diagnoses of Hyperlipidemia LDL goal <160, Vitamin D deficiency, Prediabetes, and Benign essential tremor were also pertinent to this visit.  Hemorrhoids :  Asymptomatic most of the time, managing with fiber,  otc meds  Aspirin ? Repatha?  Discussed gettimg a Cardiac CT   [facility]   Meniscal tear last summer , left knee.  Improved with PT.  Still not walking like she used to.  Used meloxicam with good results     History Carlea has a past medical history of Breast cancer ([place]) (2016), Hashimoto's thyroiditis, History of arthroscopy of left knee (1985), Hyperlipidemia, Hypothyroid, Osteoporosis, Spinal stenosis, Tremor, and Viral meningitis.   She has a past surgical history that includes Foot surgery; Knee arthroscopy (1985); Cholecystectomy (2006); Tonsillectomy; Cesarean section; Simple mastectomy with axillary sentinel node biopsy (Right, 10/10/2014); Sentinel node biopsy (Right, 10/10/2014); and Mastectomy (Right, 2016).   Her family history includes Aortic aneurysm in  her father; Breast cancer (age of onset: 47) in her sister; Breast cancer (age of onset: 2) in her maternal grandmother; Cancer in her maternal grandfather; Cancer (age of onset: 47) in her sister; Osteoporosis in her mother.She reports that she has  never smoked. She has never used smokeless tobacco. She reports that she drinks about 1.5 oz of alcohol per week. She reports that she does not use drugs.  Outpatient Medications Prior to Visit  Medication Sig Dispense Refill  . aspirin EC 81 MG tablet Take 81 mg by mouth daily.    Marland Kitchen CALCIUM PO Take by mouth.    . Cholecalciferol (VITAMIN D3) 2000 UNITS TABS Take 1 tablet by mouth daily.    Marland Kitchen docusate sodium (COLACE) 100 MG capsule Take 100 mg by mouth 2 (two) times daily.    Marland Kitchen letrozole (FEMARA) 2.5 MG tablet Take 1 tablet (2.5 mg total) by mouth daily. 90 tablet 3  . levothyroxine (SYNTHROID, LEVOTHROID) 88 MCG tablet TAKE 1 TABLET BY MOUTH EVERY DAY 30 tablet 3  . primidone (MYSOLINE) 50 MG tablet Take 1 tablet (50 mg total) by mouth at bedtime. 90 tablet 3  . propranolol (INDERAL) 40 MG tablet Take 1 tablet (40 mg total) by mouth 2 (two) times daily. 180 tablet 0  . doxycycline (VIBRA-TABS) 100 MG tablet Take 1 tablet (100 mg total) by mouth 2 (two) times daily. (Patient not taking: Reported on 03/31/2017) 20 tablet 0   No facility-administered medications prior to visit.     Review of Systems   Patient denies headache, fevers, malaise, unintentional weight loss, skin rash, eye pain, sinus congestion and sinus pain, sore throat, dysphagia,  hemoptysis , cough, dyspnea, wheezing, chest pain, palpitations, orthopnea, edema, abdominal pain, nausea, melena, diarrhea, constipation, flank pain, dysuria, hematuria, urinary  Frequency, nocturia, numbness, tingling, seizures,  Focal weakness, Loss of consciousness,  Tremor, insomnia, depression, anxiety, and suicidal ideation.      Objective:  BP 120/84 (BP Location: Left Arm, Patient Position: Sitting, Cuff Size: Normal)   Pulse 71   Temp 98.2 F (36.8 C) (Oral)   Resp 14   Ht 5\' 8"  (1.727 m)   Wt 164 lb (74.4 kg)   SpO2 97%   BMI 24.94 kg/m   Physical Exam   General appearance: alert, cooperative and appears stated age Head:  Normocephalic, without obvious abnormality, atraumatic Eyes: conjunctivae/corneas clear. PERRL, EOM's intact. Fundi benign. Ears: normal TM's and external ear canals both ears Nose: Nares normal. Septum midline. Mucosa normal. No drainage or sinus tenderness. Throat: lips, mucosa, and tongue normal; teeth and gums normal Neck: no adenopathy, no carotid bruit, no JVD, supple, symmetrical, trachea midline and thyroid not enlarged, symmetric, no tenderness/mass/nodules Lungs: clear to auscultation bilaterally Breasts: normal appearance, no masses or tenderness Heart: regular rate and rhythm, S1, S2 normal, no murmur, click, rub or gallop Abdomen: soft, non-tender; bowel sounds normal; no masses,  no organomegaly Extremities: extremities normal, atraumatic, no cyanosis or edema Pulses: 2+ and symmetric Skin: Skin color, texture, turgor normal. No rashes or lesions Neurologic: Alert and oriented X 3, normal strength and tone. Normal symmetric reflexes. Normal coordination and gait.      Assessment & Plan:   Problem List Items Addressed This Visit    Vitamin D deficiency   Relevant Orders   VITAMIN D 25 Hydroxy (Vit-D Deficiency, Fractures) (Completed)   Hyperlipidemia LDL goal <160    Using the Framingham risk calculator,  Hier10 year risk of coronary artery disease is 19%.she  is intolerant of statins and is concerned about her growing risk of CAD.Marland Kitchen  Discussed option of Repatha if CAD is demonstrated on noninvasive testing.  Referral to [facility] for cardiac CT   Lab Results  Component Value Date   CHOL 298 (H) 08/05/2017   HDL 42.50 08/05/2017   LDLCALC 184 (H) 08/05/2016   LDLDIRECT 218.0 08/05/2017   TRIG 213.0 (H) 08/05/2017   CHOLHDL 7 08/05/2017         Relevant Orders   Lipid panel (Completed)   Comprehensive metabolic panel (Completed)   Ambulatory referral to Cardiology   Benign essential tremor    Managed with primidone at night . No changes today        Other Visit  Diagnoses    Acquired hypothyroidism    -  Primary   Relevant Orders   TSH (Completed)   Prediabetes       Relevant Orders   Hemoglobin A1c (Completed)    A total of 40 minutes was spent with patient more than half of which was spent in counseling patient on the above mentioned issues , reviewing and explaining recent labs and imaging studies done, and coordination of care.  I am having William Dalton start on meloxicam. I am also having her maintain her aspirin EC, Vitamin D3, CALCIUM PO, docusate sodium, primidone, letrozole, levothyroxine, propranolol, and doxycycline.  Meds ordered this encounter  Medications  . meloxicam (MOBIC) 15 MG tablet    Sig: Take 1 tablet (15 mg total) by mouth daily.    Dispense:  30 tablet    Refill:  3    Keep on file for future refills  . doxycycline (VIBRA-TABS) 100 MG tablet    Sig: Take 1 tablet (100 mg total) by mouth 2 (two) times daily.    Dispense:  20 tablet    Refill:  2    Keep on file for future refills    Medications Discontinued During This Encounter  Medication Reason  . doxycycline (VIBRA-TABS) 100 MG tablet Reorder     Follow-up: No follow-ups on file.   Crecencio Mc, MD

## 2017-08-05 NOTE — Patient Instructions (Addendum)
  Tiffany Chang , Thank you for taking time to come for your Medicare Wellness Visit. I appreciate your ongoing commitment to your health goals. Please review the following plan we discussed and let me know if I can assist you in the future.   These are the goals we discussed: Goals    . Maintain Healthy Lifestyle     Exercise as tolerated Healthy diet Stay hydrated        This is a list of the screening recommended for you and due dates:  Health Maintenance  Topic Date Due  . Mammogram  09/25/2018  . Tetanus Vaccine  06/29/2022  . Colon Cancer Screening  07/27/2022  . Flu Shot  Completed  . DEXA scan (bone density measurement)  Completed  .  Hepatitis C: One time screening is recommended by Center for Disease Control  (CDC) for  adults born from [birth year] through 1963.   Completed  . Pneumonia vaccines  Completed

## 2017-08-06 ENCOUNTER — Other Ambulatory Visit: Payer: Self-pay | Admitting: Neurology

## 2017-08-07 DIAGNOSIS — E039 Hypothyroidism, unspecified: Secondary | ICD-10-CM | POA: Insufficient documentation

## 2017-08-07 MED ORDER — ERGOCALCIFEROL 1.25 MG (50000 UT) PO CAPS: 50000.0000 [IU] | ORAL_CAPSULE | ORAL | 2 refills | Status: DC

## 2017-08-07 NOTE — Assessment & Plan Note (Addendum)
Diagnosed in 2016,  Invasive lobular carcinoma.  S/p simple mastectomy,  No chemo,  Tolerating letrozole.  Surveillance by [facility]

## 2017-08-07 NOTE — Assessment & Plan Note (Signed)
Treated historically with evista 10 years ago. Repeated in 2017 due to use of letrozole will continue annually per Oncology , due in June 2019.

## 2017-08-07 NOTE — Assessment & Plan Note (Signed)
Thyroid function is WNL on current dose.  No current changes needed.   Lab Results  Component Value Date   TSH 1.38 08/05/2017

## 2017-08-07 NOTE — Assessment & Plan Note (Addendum)
Using the Framingham risk calculator,  Hier10 year risk of coronary artery disease is 19%.she is intolerant of statins and is concerned about her growing risk of CAD.Marland Kitchen  Discussed option of Repatha if CAD is demonstrated on noninvasive testing.  Referral to [facility] for cardiac CT   Lab Results  Component Value Date   CHOL 298 (H) 08/05/2017   HDL 42.50 08/05/2017   LDLCALC 184 (H) 08/05/2016   LDLDIRECT 218.0 08/05/2017   TRIG 213.0 (H) 08/05/2017   CHOLHDL 7 08/05/2017

## 2017-08-07 NOTE — Assessment & Plan Note (Signed)
Managed with primidone at night . No changes today

## 2017-08-08 ENCOUNTER — Encounter: Payer: Self-pay | Admitting: Internal Medicine

## 2017-08-10 ENCOUNTER — Other Ambulatory Visit: Payer: Self-pay | Admitting: Internal Medicine

## 2017-08-10 MED ORDER — ROSUVASTATIN CALCIUM 5 MG PO TABS: 5.0000 mg | ORAL_TABLET | Freq: Every day | ORAL | 1 refills | Status: DC

## 2017-08-10 NOTE — Progress Notes (Signed)
dsapatient has decided to retry statins and suspend periodically rather than have cardiac CT

## 2017-08-17 ENCOUNTER — Other Ambulatory Visit: Payer: Self-pay | Admitting: Internal Medicine

## 2017-08-24 ENCOUNTER — Other Ambulatory Visit: Payer: Self-pay | Admitting: Internal Medicine

## 2017-08-24 ENCOUNTER — Encounter: Payer: Self-pay | Admitting: Internal Medicine

## 2017-08-24 DIAGNOSIS — H259 Unspecified age-related cataract: Secondary | ICD-10-CM

## 2017-08-24 DIAGNOSIS — Z79899 Other long term (current) drug therapy: Secondary | ICD-10-CM

## 2017-08-24 DIAGNOSIS — H269 Unspecified cataract: Secondary | ICD-10-CM | POA: Insufficient documentation

## 2017-08-25 ENCOUNTER — Encounter: Payer: Self-pay | Admitting: Internal Medicine

## 2017-09-11 ENCOUNTER — Encounter: Payer: Self-pay | Admitting: Internal Medicine

## 2017-09-21 ENCOUNTER — Other Ambulatory Visit (INDEPENDENT_AMBULATORY_CARE_PROVIDER_SITE_OTHER): Payer: Medicare Other

## 2017-09-21 DIAGNOSIS — H2512 Age-related nuclear cataract, left eye: Secondary | ICD-10-CM | POA: Diagnosis not present

## 2017-09-21 DIAGNOSIS — Z79899 Other long term (current) drug therapy: Secondary | ICD-10-CM | POA: Diagnosis not present

## 2017-09-21 LAB — COMPREHENSIVE METABOLIC PANEL
ALT: 19 U/L (ref 0–35)
AST: 17 U/L (ref 0–37)
Albumin: 4 g/dL (ref 3.5–5.2)
Alkaline Phosphatase: 73 U/L (ref 39–117)
BUN: 15 mg/dL (ref 6–23)
CO2: 31 mEq/L (ref 19–32)
Calcium: 9.4 mg/dL (ref 8.4–10.5)
Chloride: 105 mEq/L (ref 96–112)
Creatinine, Ser: 0.65 mg/dL (ref 0.40–1.20)
GFR: 95.88 mL/min (ref >60.00)
Glucose, Bld: 97 mg/dL (ref 70–99)
Potassium: 4.9 mEq/L (ref 3.5–5.1)
Sodium: 141 mEq/L (ref 135–145)
Total Bilirubin: 0.3 mg/dL (ref 0.2–1.2)
Total Protein: 6.8 g/dL (ref 6.0–8.3)

## 2017-09-23 ENCOUNTER — Encounter: Payer: Self-pay | Admitting: *Deleted

## 2017-09-25 NOTE — Progress Notes (Signed)
Subjective:    Tiffany Chang was seen in consultation in the movement disorder clinic at the request of Tiffany Mc, MD.  The evaluation is for tremor.  She was previously seen by Dr. Loletta Chang at the [facility] in 2009.  I do have a note from him from May, 2009.  The patient is a 72 y.o. right handed female with a history of tremor.  Tremor began in approximately 9-10 years ago.  She first noted it in her R hand after a long trip to [place].  It is present with activation.  She went to Dr. Loletta Chang and was dx with ET.  She was started on propranolol 20 mg bid and it helped.  It is still helping and it hasn't really progressing.  The tremor is mainly in her R hand.  No tremor in the head or the legs.  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  There is a family hx of tremor due to PD in paternal grandmother and uncle.  Her balance is good.    Affected by caffeine:  no Affected by alcohol:  no Affected by stress:  no Affected by fatigue:  no Spills soup if on spoon:  no but she can notice the shake and has to concentrate if has peas on a spoon Spills glass of liquid if full:  no Affects ADL's (tying shoes, brushing teeth, etc):  no  Current/Previously tried tremor medications: propranolol  Current medications that may exacerbate tremor:  N/a  08/14/15 update:  Pt returns for f/u, accompanied by her husband who supplements the history.  Haven't seen pt in 2 years.  Pt is still on inderal 20 mg bid for tremor.  Reports that she had breast CA since last visit but did good in that regard.  Had mastectomy only.  Had recent mammogram and going to have biopsy on lesion soon.   Tremor has been slowly progressing. Husband states that she is avoiding situations more, and eating more with the L hand.  She is not spilling food.  She has trouble writing.  No trouble with brushing teeth but does shake when she does that.  No speech trouble.  Able to cook without trouble.  Thinks that  the propranolol helps but doesn't know.  R hand is more tremulous than the left.  No tremor in the legs.     11/26/15 update:  The patient is following up today regarding essential tremor.  Last visit, her propranolol was changed from 20 mg twice a day to Inderal LA, 60 mg daily.  She is doing about the same but no SE with the medication.  No new medical problems.    03/11/16 update:  The patient follows up today for her essential tremor.  I increased her Inderal last visit to 40 mg twice a day.  She reports that "I am feeling great but my tremor isn't better with the increase."  She denies any lightheadedness or near syncope.  She just got back from a trip out [place] soon to [place], [place].  06/12/16 update:  Patient follows up today for her essential tremor.  She is on propranolol, 40 mg twice a day.  She was started on primidone, 50 mg daily last visit.  She reports that it is working well, without SE.  It does make her sleepy but she takes it at bedtime.  She moved 2 months ago and has been unpacking and loves her new place.  03/31/17 update:  Pt seen in f/u for ET.  On primidone 50 mg daily and on propranolol 40 mg bid.   She has been well except she had an issue with her L knee in the summer and she didn't know what caused all the pain.  She had an MRI knee which showed torn medial meniscus.  She did PT and it helped.  She cancelled her [place] because of it but she is much better.  Tremor is about the same.    09/29/17 update: The patient is seen today for follow-up for essential tremor.  We tried to decrease her propranolol last visit to 20 mg twice per day, but she did not do well with that and had worsening tremor and ended up going back to 40 mg bid.  Overall, she does think tremor is somewhat worse at the course of time.  However, she also states that it really does not interfere with her life or what she does.  She is not that bothered by it.  She is still on primidone, 50  mg daily.  I have reviewed records since our last visit.  She is scheduled to hold cataract surgery tomorrow.    Outside reports reviewed: historical medical records, office notes and referral letter/letters.  Allergies  Allergen Reactions  . Statins Other (See Comments)     Leg cramps in high doses  . Bee Venom Hives    Current Outpatient Medications on File Prior to Visit  Medication Sig Dispense Refill  . ergocalciferol (DRISDOL) 50000 units capsule Take 1 capsule (50,000 Units total) by mouth once a week. 4 capsule 2  . letrozole (FEMARA) 2.5 MG tablet Take 1 tablet (2.5 mg total) by mouth daily. (Patient taking differently: Take 2.5 mg by mouth at bedtime. ) 90 tablet 3  . levothyroxine (SYNTHROID, LEVOTHROID) 88 MCG tablet TAKE 1 TABLET BY MOUTH EVERY DAY 30 tablet 3  . naproxen sodium (ALEVE) 220 MG tablet Take 220 mg by mouth 2 (two) times daily as needed (pain).    . primidone (MYSOLINE) 50 MG tablet Take 1 tablet (50 mg total) by mouth at bedtime. 90 tablet 3  . propranolol (INDERAL) 40 MG tablet Take 1 tablet (40 mg total) by mouth 2 (two) times daily. 180 tablet 0  . rosuvastatin (CRESTOR) 5 MG tablet Take 1 tablet (5 mg total) by mouth daily. (Patient taking differently: Take 5 mg by mouth every evening. ) 90 tablet 1   No current facility-administered medications on file prior to visit.     Past Medical History:  Diagnosis Date  . Breast cancer ([place]) 2016   [place] - RT MASTECTOMY  . Hashimoto's thyroiditis   . History of arthroscopy of left knee 1985  . Hyperlipidemia    mild, with prior statin intolerance  . Hypothyroid   . Osteoporosis    prior Evista use x 5 yrs , 2007  . Spinal stenosis    mild  . Tremor   . Viral meningitis    72 years old    Past Surgical History:  Procedure Laterality Date  . CESAREAN SECTION    . CHOLECYSTECTOMY  2006  . FOOT SURGERY     plantar fascitis, Dr. Elvina Mattes  . KNEE ARTHROSCOPY  1985   right  . MASTECTOMY Right 2016   [facility]   . SENTINEL NODE BIOPSY Right 10/10/2014   Procedure: SENTINEL NODE BIOPSY;  Surgeon: Leonie Green, MD;  Location: [facility] ORS;  Service: General;  Laterality: Right;  . SIMPLE MASTECTOMY WITH AXILLARY SENTINEL NODE BIOPSY Right 10/10/2014   Procedure: SIMPLE MASTECTOMY;  Surgeon: Leonie Green, MD;  Location: [facility] ORS;  Service: General;  Laterality: Right;  . TONSILLECTOMY      Social History   Socioeconomic History  . Marital status: Married    Spouse name: Not on file  . Number of children: Not on file  . Years of education: Not on file  . Highest education level: Not on file  Occupational History    Employer: [employer]    Comment: exercise physiologist  Social Needs  . Financial resource strain: Not on file  . Food insecurity:    Worry: Never true    Inability: Never true  . Transportation needs:    Medical: No    Non-medical: No  Tobacco Use  . Smoking status: Never Smoker  . Smokeless tobacco: Never Used  Substance and Sexual Activity  . Alcohol use: Yes    Alcohol/week: 1.5 oz    Types: 3 Standard drinks or equivalent per week    Comment: social, 2 times per week  . Drug use: No  . Sexual activity: Not Currently  Lifestyle  . Physical activity:    Days per week: Not on file    Minutes per session: Not on file  . Stress: Not on file  Relationships  . Social connections:    Talks on phone: Not on file    Gets together: Not on file    Attends religious service: Not on file    Active member of club or organization: Not on file    Attends meetings of clubs or organizations: Not on file    Relationship status: Not on file  . Intimate partner violence:    Fear of current or ex partner: No    Emotionally abused: No    Physically abused: No    Forced sexual activity: No  Other Topics Concern  . Not on file  Social History Narrative  . Not on file    Family Status  Relation Name Status  . Mother  Alive       htn, has  pacemaker  . Father  Deceased at age 67       AAA  . Sister  Alive       breast cancer  . Sister  Alive  . Sister  Alive  . Son  Alive       melanoma  . Daughter  Alive  . Sister  (Not Specified)  . MGF  (Not Specified)  . MGM  (Not Specified)    Review of Systems 10 system review was negative.   Objective:   VITALS:   Vitals:   09/29/17 1503  BP: 104/66  Pulse: 70  SpO2: 98%  Weight: 163 lb (73.9 kg)  Height: 5\' 8"  (1.727 m)   Wt Readings from Last 3 Encounters:  09/29/17 163 lb (73.9 kg)  08/05/17 164 lb (74.4 kg)  08/05/17 164 lb (74.4 kg)     GEN:  The patient appears stated age and is in NAD. HEENT:  Normocephalic, atraumatic.  The mucous membranes are moist. The superficial temporal arteries are without ropiness or tenderness. CV:  RRR Lungs:  CTAB Neck/HEME:  There are no carotid bruits bilaterally.  Neurological examination:  Orientation: The patient is alert and oriented x3. Cranial nerves: There is good facial symmetry. The speech is fluent and clear. Soft palate rises symmetrically and  there is no tongue deviation. Hearing is intact to conversational tone. Sensation: Sensation is intact to light touch throughout Motor: Strength is 5/5 in the bilateral upper and lower extremities.   Shoulder shrug is equal and symmetric.  There is no pronator drift.  Movement examination: Abnormal movements: There is no tremor of the outstretched hands.  When asked to pour water from one glass to another, she spills the water all over, especially when the full glass is in the right hand.  Tremor becomes moderate to severe then. Coordination:  There is no decremation with RAM's, with any form of RAMS, including alternating supination and pronation of the forearm, hand opening and closing, finger taps, heel taps and toe taps.      Assessment/Plan:   1.  Essential tremor  -talked about various options.  I told her we could technically try and increase the propranolol  more (decreasing it definitely made things worse).  She is currently on 40 mill grams twice daily.  She is also on primidone, 50 mg at night.  Overall, she feels that tremor is not that bothersome, even though becomes quite severe when pouring water.  Therefore, we decided not to change medication.  We did, however, discuss DBS therapy in case tremor becomes more bothersome.  Questions were asked and answered them to the best my ability.    2.  F/u 6 months.

## 2017-09-29 ENCOUNTER — Encounter: Payer: Self-pay | Admitting: Neurology

## 2017-09-29 ENCOUNTER — Ambulatory Visit (INDEPENDENT_AMBULATORY_CARE_PROVIDER_SITE_OTHER): Payer: Medicare Other | Admitting: Neurology

## 2017-09-29 VITALS — BP 104/66 | HR 70 | Ht 68.0 in | Wt 163.0 lb

## 2017-09-29 DIAGNOSIS — G250 Essential tremor: Secondary | ICD-10-CM | POA: Diagnosis not present

## 2017-09-30 ENCOUNTER — Ambulatory Visit: Payer: Medicare Other | Admitting: Certified Registered Nurse Anesthetist

## 2017-09-30 ENCOUNTER — Other Ambulatory Visit: Payer: Self-pay

## 2017-09-30 ENCOUNTER — Ambulatory Visit
Admission: RE | Admit: 2017-09-30 | Discharge: 2017-09-30 | Disposition: A | Discharge status: Home / Self Care | Payer: Medicare Other | Source: Ambulatory Visit | Attending: Ophthalmology | Admitting: Ophthalmology

## 2017-09-30 ENCOUNTER — Encounter
Admission: RE | Disposition: A | Discharge status: Home / Self Care | Payer: Self-pay | Source: Ambulatory Visit | Attending: Ophthalmology

## 2017-09-30 DIAGNOSIS — H2512 Age-related nuclear cataract, left eye: Secondary | ICD-10-CM | POA: Diagnosis not present

## 2017-09-30 DIAGNOSIS — Z853 Personal history of malignant neoplasm of breast: Secondary | ICD-10-CM | POA: Insufficient documentation

## 2017-09-30 DIAGNOSIS — Z888 Allergy status to other drugs, medicaments and biological substances status: Secondary | ICD-10-CM | POA: Diagnosis not present

## 2017-09-30 DIAGNOSIS — Z79890 Hormone replacement therapy (postmenopausal): Secondary | ICD-10-CM | POA: Diagnosis not present

## 2017-09-30 DIAGNOSIS — E039 Hypothyroidism, unspecified: Secondary | ICD-10-CM | POA: Insufficient documentation

## 2017-09-30 DIAGNOSIS — Z9011 Acquired absence of right breast and nipple: Secondary | ICD-10-CM | POA: Insufficient documentation

## 2017-09-30 DIAGNOSIS — Z79899 Other long term (current) drug therapy: Secondary | ICD-10-CM | POA: Diagnosis not present

## 2017-09-30 DIAGNOSIS — M810 Age-related osteoporosis without current pathological fracture: Secondary | ICD-10-CM | POA: Diagnosis not present

## 2017-09-30 DIAGNOSIS — Z79811 Long term (current) use of aromatase inhibitors: Secondary | ICD-10-CM | POA: Insufficient documentation

## 2017-09-30 DIAGNOSIS — E063 Autoimmune thyroiditis: Secondary | ICD-10-CM | POA: Insufficient documentation

## 2017-09-30 HISTORY — PX: CATARACT EXTRACTION W/PHACO: SHX586

## 2017-09-30 SURGERY — PHACOEMULSIFICATION, CATARACT, WITH IOL INSERTION
Anesthesia: Monitor Anesthesia Care | Site: Eye | Laterality: Left | Wound class: "Clean "

## 2017-09-30 MED ORDER — EPINEPHRINE PF 1 MG/ML IJ SOLN
INTRAMUSCULAR | Status: DC | PRN
  Administered 2017-09-30: 1 mL via OPHTHALMIC

## 2017-09-30 MED ORDER — CARBACHOL 0.01 % IO SOLN
INTRAOCULAR | Status: DC | PRN
  Administered 2017-09-30: .5 mL via INTRAOCULAR

## 2017-09-30 MED ORDER — LIDOCAINE HCL (PF) 4 % IJ SOLN
INTRAOCULAR | Status: DC | PRN
  Administered 2017-09-30: 2 mL via OPHTHALMIC

## 2017-09-30 MED ORDER — MIDAZOLAM HCL 2 MG/2ML IJ SOLN
INTRAMUSCULAR | Status: DC | PRN
  Administered 2017-09-30: 1 mg via INTRAVENOUS
  Administered 2017-09-30 (×2): 0.5 mg via INTRAVENOUS

## 2017-09-30 MED ORDER — ARMC OPHTHALMIC DILATING DROPS
1.0000 "application " | OPHTHALMIC | Status: AC
  Administered 2017-09-30 (×3): 1 via OPHTHALMIC

## 2017-09-30 MED ORDER — NA CHONDROIT SULF-NA HYALURON 40-17 MG/ML IO SOLN
INTRAOCULAR | Status: DC | PRN
  Administered 2017-09-30: 1 mL via INTRAOCULAR

## 2017-09-30 MED ORDER — SODIUM CHLORIDE 0.9 % IV SOLN
INTRAVENOUS | Status: DC
  Administered 2017-09-30: 09:00:00 via INTRAVENOUS

## 2017-09-30 MED ORDER — MOXIFLOXACIN HCL 0.5 % OP SOLN
OPHTHALMIC | Status: DC | PRN
  Administered 2017-09-30: .2 mL via OPHTHALMIC

## 2017-09-30 MED ORDER — MOXIFLOXACIN HCL 0.5 % OP SOLN: 1.0000 [drp] | OPHTHALMIC | Status: DC | PRN

## 2017-09-30 MED ORDER — POVIDONE-IODINE 5 % OP SOLN
OPHTHALMIC | Status: DC | PRN
  Administered 2017-09-30: 1 via OPHTHALMIC

## 2017-09-30 MED ORDER — MIDAZOLAM HCL 2 MG/2ML IJ SOLN
INTRAMUSCULAR | Status: AC
  Filled 2017-09-30: qty 2

## 2017-09-30 SURGICAL SUPPLY — 16 items
GLOVE BIO SURGEON STRL SZ8 (GLOVE) ×2 IMPLANT
GLOVE BIOGEL M 6.5 STRL (GLOVE) ×2 IMPLANT
GLOVE SURG LX 8.0 MICRO (GLOVE) ×1
GLOVE SURG LX STRL 8.0 MICRO (GLOVE) ×1 IMPLANT
GOWN STRL REUS W/ TWL LRG LVL3 (GOWN DISPOSABLE) ×2 IMPLANT
GOWN STRL REUS W/TWL LRG LVL3 (GOWN DISPOSABLE) ×2
LABEL CATARACT MEDS ST (LABEL) ×2 IMPLANT
LENS IOL TECNIS ITEC 23.0 (Intraocular Lens) ×1 IMPLANT
PACK CATARACT (MISCELLANEOUS) ×2 IMPLANT
PACK CATARACT BRASINGTON LX (MISCELLANEOUS) ×2 IMPLANT
PACK EYE AFTER SURG (MISCELLANEOUS) ×2 IMPLANT
SOL BSS BAG (MISCELLANEOUS) ×2
SOLUTION BSS BAG (MISCELLANEOUS) ×1 IMPLANT
SYR 5ML LL (SYRINGE) ×2 IMPLANT
WATER STERILE IRR 250ML POUR (IV SOLUTION) ×2 IMPLANT
WIPE NON LINTING 3.25X3.25 (MISCELLANEOUS) ×2 IMPLANT

## 2017-09-30 NOTE — Transfer of Care (Signed)
Immediate Anesthesia Transfer of Care Note  Patient: Tiffany Chang  Procedure(s) Performed: CATARACT EXTRACTION PHACO AND INTRAOCULAR LENS PLACEMENT (IOC) (Left Eye)  Patient Location: Short Stay  Anesthesia Type:MAC  Level of Consciousness: awake, alert , oriented and patient cooperative  Airway & Oxygen Therapy: Patient Spontanous Breathing  Post-op Assessment: Report given to RN and Post -op Vital signs reviewed and stable  Post vital signs: Reviewed and stable  Last Vitals:  Vitals Value Taken Time  BP    Temp    Pulse 63 09/30/2017  9:29 AM  Resp 18 09/30/2017  9:29 AM  SpO2 98 % 09/30/2017  9:29 AM    Last Pain:  Vitals:   09/30/17 0929  TempSrc: Oral  PainSc: 0-No pain         Complications: No apparent anesthesia complications

## 2017-09-30 NOTE — Op Note (Signed)
PREOPERATIVE DIAGNOSIS:  Nuclear sclerotic cataract of the left eye.   POSTOPERATIVE DIAGNOSIS:  Nuclear sclerotic cataract of the left eye.   OPERATIVE PROCEDURE: Procedure(s): CATARACT EXTRACTION PHACO AND INTRAOCULAR LENS PLACEMENT (I[facility])   SURGEON:  Birder Robson, MD.   ANESTHESIA:  Anesthesiologist: Alphonsus Sias, MD CRNA: Eben Burow, CRNA  1.      Managed anesthesia care. 2.     0.71ml of Shugarcaine was instilled following the paracentesis   COMPLICATIONS:  None.   TECHNIQUE:   Stop and chop   DESCRIPTION OF PROCEDURE:  The patient was examined and consented in the preoperative holding area where the aforementioned topical anesthesia was applied to the left eye and then brought back to the Operating Room where the left eye was prepped and draped in the usual sterile ophthalmic fashion and a lid speculum was placed. A paracentesis was created with the side port blade and the anterior chamber was filled with viscoelastic. A near clear corneal incision was performed with the steel keratome. A continuous curvilinear capsulorrhexis was performed with a cystotome followed by the capsulorrhexis forceps. Hydrodissection and hydrodelineation were carried out with BSS on a blunt cannula. The lens was removed in a stop and chop  technique and the remaining cortical material was removed with the irrigation-aspiration handpiece. The capsular bag was inflated with viscoelastic and the Technis [id number] lens was placed in the capsular bag without complication. The remaining viscoelastic was removed from the eye with the irrigation-aspiration handpiece. The wounds were hydrated. The anterior chamber was flushed with Miostat and the eye was inflated to physiologic pressure. 0.78ml Vigamox was placed in the anterior chamber. The wounds were found to be water tight. The eye was dressed with Vigamox. The patient was given protective glasses to wear throughout the day and a shield with which to sleep  tonight. The patient was also given drops with which to begin a drop regimen today and will follow-up with me in one day. Implant Name Type Inv. Item Serial No. Manufacturer Lot No. LRB No. Used  LENS IOL DIOP 23.0 - [id number] Intraocular Lens LENS IOL DIOP 23.0 [id number] AMO  Left 1    Procedure(s) with comments: CATARACT EXTRACTION PHACO AND INTRAOCULAR LENS PLACEMENT ([facility]) (Left) - [place] 00:40 AP% 15.7 CDE 6.38 Fluid pack lot # [id number]  Electronically signed: Birder Robson 09/30/2017 9:28 AM

## 2017-09-30 NOTE — Anesthesia Post-op Follow-up Note (Signed)
Anesthesia QCDR form completed.        

## 2017-09-30 NOTE — Discharge Instructions (Addendum)
Follow Dr. Inda Coke postop eye drop instruction sheet as reviewed.  Eye Surgery Discharge Instructions  Expect mild scratchy sensation or mild soreness. DO NOT RUB YOUR EYE!  The day of surgery:  Minimal physical activity, but bed rest is not required  No reading, computer work, or close hand work  No bending, lifting, or straining.  May watch TV  For 24 hours:  No driving, legal decisions, or alcoholic beverages  Safety precautions  Eat anything you prefer: It is better to start with liquids, then soup then solid foods.  Solar shield eyeglasses should be worn for comfort in the sunlight/patch while sleeping  Resume all regular medications including aspirin or Coumadin if these were discontinued prior to surgery. You may shower, bathe, shave, or wash your hair. Tylenol may be taken for mild discomfort.  Call your doctor if you experience significant pain, nausea, or vomiting, fever > 101 or other signs of infection. [phone] or [phone] Specific instructions:  Follow-up Information    Birder Robson, MD Follow up.   Specialty:  Ophthalmology Why:  10/01/17 @ 9:45 AM Contact information: [address] [place] [address] [phone]

## 2017-09-30 NOTE — Anesthesia Preprocedure Evaluation (Signed)
Anesthesia Evaluation  Patient identified by MRN, date of birth, ID band Patient awake    Reviewed: Allergy & Precautions, H&P , NPO status , reviewed documented beta blocker date and time   Airway Mallampati: II  TM Distance: >3 FB     Dental  (+) Teeth Intact, Chipped   Pulmonary    Pulmonary exam normal        Cardiovascular Normal cardiovascular exam     Neuro/Psych    GI/Hepatic neg GERD  ,  Endo/Other  Hypothyroidism   Renal/GU      Musculoskeletal   Abdominal   Peds  Hematology   Anesthesia Other Findings Past Medical History: 2016: Breast cancer ([place])     Comment:  [facility] - RT MASTECTOMY No date: Hashimoto's thyroiditis 1985: History of arthroscopy of left knee No date: Hyperlipidemia     Comment:  mild, with prior statin intolerance No date: Hypothyroid No date: Osteoporosis     Comment:  prior Evista use x 5 yrs , 2007 No date: Spinal stenosis     Comment:  mild No date: Tremor No date: Viral meningitis     Comment:  72 years old  Past Surgical History: No date: CESAREAN SECTION 2006: CHOLECYSTECTOMY No date: FOOT SURGERY     Comment:  plantar fascitis, Dr. Elvina Mattes 1985: KNEE ARTHROSCOPY     Comment:  right 2016: MASTECTOMY; Right     Comment:  [place] 10/10/2014: SENTINEL NODE BIOPSY; Right     Comment:  Procedure: SENTINEL NODE BIOPSY;  Surgeon: Leonie Green, MD;  Location: [facility] ORS;  Service: General;                Laterality: Right; 10/10/2014: SIMPLE MASTECTOMY WITH AXILLARY SENTINEL NODE BIOPSY; Right     Comment:  Procedure: SIMPLE MASTECTOMY;  Surgeon: Leonie Green, MD;  Location: [facility] ORS;  Service: General;                Laterality: Right; No date: TONSILLECTOMY  BMI    Body Mass Index:  24.78 kg/m      Reproductive/Obstetrics                             Anesthesia Physical Anesthesia Plan  ASA:  II  Anesthesia Plan: MAC   Post-op Pain Management:    Induction:   PONV Risk Score and Plan: 2 and TIVA  Airway Management Planned:   Additional Equipment:   Intra-op Plan:   Post-operative Plan:   Informed Consent: I have reviewed the patients History and Physical, chart, labs and discussed the procedure including the risks, benefits and alternatives for the proposed anesthesia with the patient or authorized representative who has indicated his/her understanding and acceptance.   Dental Advisory Given  Plan Discussed with: CRNA  Anesthesia Plan Comments:         Anesthesia Quick Evaluation

## 2017-09-30 NOTE — Anesthesia Postprocedure Evaluation (Signed)
Anesthesia Post Note  Patient: Deneshia Zucker  Procedure(s) Performed: CATARACT EXTRACTION PHACO AND INTRAOCULAR LENS PLACEMENT ([place]) (Left Eye)  Patient location during evaluation: Endoscopy Anesthesia Type: MAC Level of consciousness: awake and alert Pain management: pain level controlled Vital Signs Assessment: post-procedure vital signs reviewed and stable Respiratory status: spontaneous breathing, nonlabored ventilation and respiratory function stable Cardiovascular status: blood pressure returned to baseline and stable Postop Assessment: no apparent nausea or vomiting Anesthetic complications: no     Last Vitals:  Vitals:   09/30/17 0929 09/30/17 0937  BP: (!) 104/57 111/68  Pulse: 63 62  Resp: 18 16  Temp: 37.1 C   SpO2: 99% 99%    Last Pain:  Vitals:   09/30/17 0937  TempSrc:   PainSc: 0-No pain                 Alphonsus Sias

## 2017-09-30 NOTE — H&P (Signed)
All labs reviewed. Abnormal studies sent to patients PCP when indicated.  Previous H&P reviewed, patient examined, there are NO CHANGES.  Tiffany Accomando Porfilio5/15/20199:04 AM

## 2017-10-14 ENCOUNTER — Other Ambulatory Visit: Payer: Self-pay | Admitting: Neurology

## 2017-10-22 ENCOUNTER — Other Ambulatory Visit: Payer: Self-pay | Admitting: Internal Medicine

## 2017-10-24 NOTE — Progress Notes (Signed)
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  INTERVAL HISTORY: Patient returns to clinic today for routine six-month follow-up.  She continues to tolerate letrozole well without significant side effects. She currently feels well and is asymptomatic. She has no neurologic complaints.  She denies any recent fevers or illnesses.  She has a good appetite and denies weight loss. She has no chest pain or shortness of breath. She denies any nausea, vomiting, constipation, or diarrhea. She has no urinary complaints.  Patient feels at her baseline offers no specific complaints today.  REVIEW OF SYSTEMS:   Review of Systems  Constitutional: Negative.  Negative for fever, malaise/fatigue and weight loss.  Respiratory: Negative.  Negative for cough and shortness of breath.   Cardiovascular: Negative.  Negative for chest pain and leg swelling.  Gastrointestinal: Negative.  Negative for abdominal pain.  Genitourinary: Negative.  Negative for dysuria.  Musculoskeletal: Negative.  Negative for back pain.  Skin: Negative.  Negative for rash.  Neurological: Negative.  Negative for sensory change, focal weakness and weakness.  Psychiatric/Behavioral: Negative.  The patient is not nervous/anxious.     As per HPI. Otherwise, a complete review of systems is negative.  PAST MEDICAL HISTORY: Past Medical History:  Diagnosis Date  . Breast cancer ([place]) 2016   [place] - RT MASTECTOMY  . Hashimoto's thyroiditis   . History of arthroscopy of left knee 1985  . Hyperlipidemia    mild, with prior statin intolerance  . Hypothyroid   . Osteoporosis    prior Evista use x 5 yrs , 2007  . Spinal stenosis    mild  . Tremor   . Viral meningitis    72 years old    PAST SURGICAL HISTORY: Past Surgical History:  Procedure Laterality Date  . CATARACT EXTRACTION W/PHACO Left 09/30/2017   Procedure: CATARACT EXTRACTION PHACO AND INTRAOCULAR LENS PLACEMENT ([facility]);  Surgeon: Birder Robson, MD;  Location: [facility] ORS;  Service: Ophthalmology;  Laterality: Left;  [place] 00:40 AP% 15.7 CDE 6.38 Fluid pack lot # [id number]  . CESAREAN SECTION    . CHOLECYSTECTOMY  2006  . FOOT SURGERY     plantar fascitis, Dr. Elvina Mattes  . KNEE ARTHROSCOPY  1985   right  . MASTECTOMY Right 2016   [facility]  . SENTINEL NODE BIOPSY Right 10/10/2014   Procedure: SENTINEL NODE BIOPSY;  Surgeon: Leonie Green, MD;  Location: [facility] ORS;  Service: General;  Laterality: Right;  . SIMPLE MASTECTOMY WITH AXILLARY SENTINEL NODE BIOPSY Right 10/10/2014   Procedure: SIMPLE MASTECTOMY;  Surgeon: Leonie Green, MD;  Location: [facility] ORS;  Service: General;  Laterality: Right;  . TONSILLECTOMY      FAMILY HISTORY Family History  Problem Relation Age of Onset  . Osteoporosis Mother   . Aortic aneurysm Father   . Cancer Sister 67       Breast  . Breast cancer Sister 76  . Cancer Maternal Grandfather        breast  . Breast cancer Maternal Grandmother 86       ADVANCED DIRECTIVES:    HEALTH MAINTENANCE: Social History   Tobacco Use  . Smoking status: Never Smoker  . Smokeless tobacco: Never Used  Substance Use Topics  .  Alcohol use: Yes    Alcohol/week: 1.5 oz    Types: 3 Standard drinks or equivalent per week    Comment: social, 2 times per week  . Drug use: No     Colonoscopy:  PAP:  Bone density:  Lipid panel:  Allergies  Allergen Reactions  . Statins Other (See Comments)     Leg cramps in high doses  . Bee Venom Hives    Current Outpatient Medications  Medication Sig Dispense Refill  . letrozole (FEMARA) 2.5 MG tablet Take 1 tablet (2.5 mg total) by mouth daily. (Patient taking differently: Take 2.5  mg by mouth at bedtime. ) 90 tablet 3  . levothyroxine (SYNTHROID, LEVOTHROID) 88 MCG tablet TAKE 1 TABLET BY MOUTH EVERY DAY 30 tablet 3  . naproxen sodium (ALEVE) 220 MG tablet Take 220 mg by mouth 2 (two) times daily as needed (pain).    . primidone (MYSOLINE) 50 MG tablet TAKE 1 TABLET BY MOUTH EVERYDAY AT BEDTIME 90 tablet 1  . propranolol (INDERAL) 40 MG tablet Take 1 tablet (40 mg total) by mouth 2 (two) times daily. 180 tablet 0  . rosuvastatin (CRESTOR) 5 MG tablet Take 1 tablet (5 mg total) by mouth daily. (Patient taking differently: Take 5 mg by mouth every evening. ) 90 tablet 1  . Vitamin D, Ergocalciferol, (DRISDOL) 50000 units CAPS capsule TAKE ONE CAPSULE BY MOUTH ONE TIME PER WEEK 4 capsule 2   No current facility-administered medications for this visit.     OBJECTIVE: Vitals:   10/26/17 1059 10/26/17 1136  BP:  127/80  Pulse:  65  Resp: 12   Temp:  (!) 97.4 F (36.3 C)     Body mass index is 25.15 kg/m.    ECOG FS:0 - Asymptomatic  General: Well-developed, well-nourished, no acute distress. Eyes: Pink conjunctiva, anicteric sclera. Breast: Right mastectomy. Lungs: Clear to auscultation bilaterally. Heart: Regular rate and rhythm. No rubs, murmurs, or gallops. Abdomen: Soft, nontender, nondistended. No organomegaly noted, normoactive bowel sounds. Musculoskeletal: No edema, cyanosis, or clubbing. Neuro: Alert, answering all questions appropriately. Cranial nerves grossly intact. Skin: No rashes or petechiae noted. Psych: Normal affect.  LAB RESULTS:  Lab Results  Component Value Date   NA 141 09/21/2017   K 4.9 09/21/2017   CL 105 09/21/2017   CO2 31 09/21/2017   GLUCOSE 97 09/21/2017   BUN 15 09/21/2017   CREATININE 0.65 09/21/2017   CALCIUM 9.4 09/21/2017   PROT 6.8 09/21/2017   ALBUMIN 4.0 09/21/2017   AST 17 09/21/2017   ALT 19 09/21/2017   ALKPHOS 73 09/21/2017   BILITOT 0.3 09/21/2017   GFRNONAA >60 10/03/2014   GFRAA >60 10/03/2014     Lab Results  Component Value Date   WBC 5.7 08/05/2016   NEUTROABS 3.8 08/05/2016   HGB 13.5 08/05/2016   HCT 40.8 08/05/2016   MCV 88.5 08/05/2016   PLT 335.0 08/05/2016     STUDIES: Dg Bone Density  Result Date: 10/26/2017 EXAM: DUAL X-RAY ABSORPTIOMETRY (DXA) FOR BONE MINERAL DENSITY IMPRESSION: Dear Dr. Grayland Ormond, Your patient Pollyann Roa completed a BMD test on 10/26/2017 using the [place] (analysis version: 14.10) manufactured by [employer]. The following summarizes the results of our evaluation. PATIENT BIOGRAPHICAL: Name: Ketzia, Guzek Patient ID: [id number] Birth Date: 03-Jun-1945 Height: 67.5 in. Gender: Female Exam Date: 10/26/2017 Weight: 163.8 lbs. Indications: Caucasian, Family Hx of Osteoporosis, Height Loss, High Risk Meds, History of Breast Cancer, Postmenopausal Fractures: Treatments: Calcium, Letrozole, Synthroid, Vit  D ASSESSMENT: The BMD measured at Femur Neck Right is 0.769 g/cm2 with a T-score of -1.9. This patient is considered osteopenic according to [facility]) criteria. L-4 was excluded due to degenerative changes. Site Region Measured Measured WHO Young Adult BMD Date       Age      Classification T-score AP Spine L1-L3 10/26/2017 69.7 Osteopenia -1.1 1.041 g/cm2 AP Spine L1-L3 10/23/2016 68.7 Osteopenia -1.2 1.038 g/cm2 AP Spine L1-L3 10/23/2015 67.7 Normal -0.7 1.099 g/cm2 AP Spine L1-L3 10/31/2014 66.7 Normal -0.7 1.101 g/cm2 AP Spine L1-L3 08/31/2013 65.5 Normal -0.6 1.107 g/cm2 AP Spine L1-L3 02/27/2010 62.0 Normal -0.6 1.105 g/cm2 DualFemur Neck Right 10/26/2017 69.7 Osteopenia -1.9 0.769 g/cm2 DualFemur Neck Right 10/23/2016 68.7 Osteopenia -1.9 0.774 g/cm2 DualFemur Neck Right 10/23/2015 67.7 Osteopenia -1.7 0.807 g/cm2 DualFemur Neck Right 10/31/2014 66.7 Osteopenia -1.7 0.805 g/cm2 DualFemur Neck Right 02/27/2010 62.0 Osteopenia -1.8 0.793 g/cm2 DualFemur Total Mean 10/26/2017 69.7 Osteopenia -1.3 0.850 g/cm2 DualFemur Total Mean  10/23/2016 68.7 Osteopenia -1.1 0.863 g/cm2 DualFemur Total Mean 10/23/2015 67.7 Normal -1.0 0.886 g/cm2 DualFemur Total Mean 10/31/2014 66.7 Normal -1.0 0.887 g/cm2 DualFemur Total Mean 02/27/2010 62.0 Normal -1.0 0.882 g/cm2 World Health Organization [facility]) criteria for post-menopausal, Caucasian Women: Normal:       T-score at or above -1 SD Osteopenia:   T-score between -1 and -2.5 SD Osteoporosis: T-score at or below -2.5 SD RECOMMENDATIONS: 1. All patients should optimize calcium and vitamin D intake. 2. Consider FDA-approved medical therapies in postmenopausal women and men aged 66 years and older, based on the following: a. A hip or vertebral(clinical or morphometric) fracture b. T-score < -2.5 at the femoral neck or spine after appropriate evaluation to exclude secondary causes c. Low bone mass (T-score between -1.0 and -2.5 at the femoral neck or spine) and a 10-year probability of a hip fracture > 3% or a 10-year probability of a major osteoporosis-related fracture > 20% based on the [place]-adapted WHO algorithm d. Clinician judgment and/or patient preferences may indicate treatment for people with 10-year fracture probabilities above or below these levels FOLLOW-UP: People with diagnosed cases of osteoporosis or at high risk for fracture should have regular bone mineral density tests. For patients eligible for Medicare, routine testing is allowed once every 2 years. The testing frequency can be increased to one year for patients who have rapidly progressing disease, those who are receiving or discontinuing medical therapy to restore bone mass, or have additional risk factors. I have reviewed this report, and agree with the above findings. [facility] Radiology Dear Dr. Grayland Ormond, Your patient KEYONA EMRICH completed a FRAX assessment on 10/26/2017 using the [place] (analysis version: 14.10) manufactured by [employer]. The following summarizes the results of our evaluation. PATIENT BIOGRAPHICAL:  Name: Sheniqua, Carolan Patient ID: [id number] Birth Date: 08-23-1945 Height:    67.5 in. Gender:     Female    [age]       Weight:    163.8 lbs. Ethnicity:  White                            Exam Date: 10/26/2017 FRAX* RESULTS:  (version: 3.5) 10-year Probability of Fracture1 Major Osteoporotic Fracture2 Hip Fracture 11.7% 2.2% Population: [place] (Caucasian) Risk Factors: None Based on Femur (Right) Neck BMD 1 -The 10-year probability of fracture may be lower than reported if the patient has received treatment.  2 -Major Osteoporotic Fracture: Clinical Spine, Forearm, Hip or Shoulder *FRAX is a Materials engineer of the [employer] for Metabolic Bone Disease, a [place] (WHO) [facility]. ASSESSMENT: The probability of a major osteoporotic fracture is 11.7% within the next ten years. The probability of a hip fracture is 2.2% within the next ten years. . Electronically Signed   By: Rolm Baptise M.D.   On: 10/26/2017 09:32   Mm 3d Screen Breast Uni Left  Result Date: 10/26/2017 CLINICAL DATA:  Screening. EXAM: DIGITAL SCREENING UNILATERAL LEFT MAMMOGRAM WITH CAD AND TOMO COMPARISON:  Previous exam(s). ACR Breast Density Category c: The breast tissue is heterogeneously dense, which may obscure small masses. FINDINGS: There are no findings suspicious for malignancy. Images were processed with CAD. IMPRESSION: No mammographic evidence of malignancy. A result letter of this screening mammogram will be mailed directly to the patient. RECOMMENDATION: Screening mammogram in one year. (Code:[id number]-B-01Y) BI-RADS CATEGORY  1: Negative. Electronically Signed   By: Lajean Manes M.D.   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  PLAN:    1. 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 Given the fact that her malignancy is lobular in nature, she did not require chemotherapy. Continue letrozole for 5 years completing in June 2021.  Patient's most recent mammogram this morning, October 26, 2017, reported as BI-RADS 1.  Repeat in June 2020.  Return to clinic in 6 months for routine evaluation.  2. Osteopenia: Patient's bone mineral density from October 26, 2017 revealed a T score of -1.9 which is unchanged from one year prior.  Continue calcium and vitamin D supplementation.  Repeat in June 2020.   3. Left breast abnormality: Patient had breast biopsy on August 21, 2015 that was negative for malignancy. Continue mammograms as above.    Approximately 20 minutes was spent in discussion of which greater than 50% was consultation.  Patient expressed understanding and was in agreement with this plan. She also understands that She can call clinic at any time with any questions, concerns, or complaints.    Lloyd Huger, MD   10/26/2017 2:08 PM

## 2017-10-26 ENCOUNTER — Other Ambulatory Visit: Payer: Self-pay | Admitting: Oncology

## 2017-10-26 ENCOUNTER — Encounter: Payer: Self-pay | Admitting: Oncology

## 2017-10-26 ENCOUNTER — Ambulatory Visit
Admission: RE | Admit: 2017-10-26 | Discharge: 2017-10-26 | Disposition: A | Discharge status: Home / Self Care | Payer: Medicare Other | Source: Ambulatory Visit | Attending: Oncology | Admitting: Oncology

## 2017-10-26 ENCOUNTER — Other Ambulatory Visit: Payer: Self-pay

## 2017-10-26 ENCOUNTER — Inpatient Hospital Stay: Payer: Medicare Other | Attending: Oncology | Admitting: Oncology

## 2017-10-26 VITALS — BP 127/80 | HR 65 | Temp 97.4°F | Resp 12 | Ht 67.5 in | Wt 163.0 lb

## 2017-10-26 DIAGNOSIS — C50411 Malignant neoplasm of upper-outer quadrant of right female breast: Secondary | ICD-10-CM

## 2017-10-26 DIAGNOSIS — M85861 Other specified disorders of bone density and structure, right lower leg: Secondary | ICD-10-CM | POA: Diagnosis not present

## 2017-10-26 DIAGNOSIS — Z170 Estrogen receptor positive status [ER+]: Secondary | ICD-10-CM

## 2017-10-26 DIAGNOSIS — Z9011 Acquired absence of right breast and nipple: Secondary | ICD-10-CM | POA: Insufficient documentation

## 2017-10-26 DIAGNOSIS — Z1231 Encounter for screening mammogram for malignant neoplasm of breast: Secondary | ICD-10-CM | POA: Insufficient documentation

## 2017-10-26 DIAGNOSIS — Z780 Asymptomatic menopausal state: Secondary | ICD-10-CM | POA: Diagnosis not present

## 2017-10-26 DIAGNOSIS — M8580 Other specified disorders of bone density and structure, unspecified site: Secondary | ICD-10-CM | POA: Diagnosis not present

## 2017-10-26 DIAGNOSIS — M8588 Other specified disorders of bone density and structure, other site: Secondary | ICD-10-CM | POA: Insufficient documentation

## 2017-10-26 DIAGNOSIS — M8589 Other specified disorders of bone density and structure, multiple sites: Secondary | ICD-10-CM | POA: Diagnosis not present

## 2017-10-26 DIAGNOSIS — Z79811 Long term (current) use of aromatase inhibitors: Secondary | ICD-10-CM | POA: Diagnosis not present

## 2017-10-26 NOTE — Progress Notes (Signed)
Patient here for follow uo no changes since last appt.

## 2017-11-02 ENCOUNTER — Other Ambulatory Visit: Payer: Self-pay | Admitting: Neurology

## 2017-11-06 DIAGNOSIS — Z1283 Encounter for screening for malignant neoplasm of skin: Secondary | ICD-10-CM | POA: Diagnosis not present

## 2017-11-06 DIAGNOSIS — L820 Inflamed seborrheic keratosis: Secondary | ICD-10-CM | POA: Diagnosis not present

## 2017-11-06 DIAGNOSIS — D2272 Melanocytic nevi of left lower limb, including hip: Secondary | ICD-10-CM | POA: Diagnosis not present

## 2017-11-06 DIAGNOSIS — L821 Other seborrheic keratosis: Secondary | ICD-10-CM | POA: Diagnosis not present

## 2017-12-04 ENCOUNTER — Other Ambulatory Visit: Payer: Self-pay | Admitting: Internal Medicine

## 2017-12-15 ENCOUNTER — Other Ambulatory Visit: Payer: Self-pay | Admitting: Internal Medicine

## 2017-12-18 ENCOUNTER — Encounter: Payer: Self-pay | Admitting: Neurology

## 2017-12-28 MED ORDER — PRIMIDONE 50 MG PO TABS: 50.0000 mg | ORAL_TABLET | Freq: Two times a day (BID) | ORAL | 1 refills | Status: DC

## 2018-01-19 ENCOUNTER — Other Ambulatory Visit: Payer: Self-pay | Admitting: Internal Medicine

## 2018-01-22 MED ORDER — ROSUVASTATIN CALCIUM 5 MG PO TABS: 5.0000 mg | ORAL_TABLET | Freq: Every day | ORAL | 1 refills | Status: DC

## 2018-01-26 NOTE — Telephone Encounter (Signed)
documented

## 2018-02-08 ENCOUNTER — Other Ambulatory Visit: Payer: Self-pay | Admitting: Internal Medicine

## 2018-02-10 ENCOUNTER — Other Ambulatory Visit: Payer: Self-pay

## 2018-02-10 MED ORDER — VITAMIN D (ERGOCALCIFEROL) 1.25 MG (50000 UNIT) PO CAPS: ORAL_CAPSULE | ORAL | 2 refills | Status: DC

## 2018-03-02 DIAGNOSIS — Z23 Encounter for immunization: Secondary | ICD-10-CM | POA: Diagnosis not present

## 2018-03-28 ENCOUNTER — Other Ambulatory Visit: Payer: Self-pay | Admitting: Internal Medicine

## 2018-03-31 NOTE — Progress Notes (Signed)
Subjective:    Tiffany Chang was seen in consultation in the movement disorder clinic at the request of Tiffany Mc, MD.  The evaluation is for tremor.  She was previously seen by Dr. Loletta Chang at the [place] [facility] in 2009.  I do have a note from him from May, 2009.  The patient is a 72 y.o. right handed female with a history of tremor.  Tremor began in approximately 9-10 years ago.  She first noted it in her R hand after a long trip to [place].  It is present with activation.  She went to Dr. Loletta Chang and was dx with ET.  She was started on propranolol 20 mg bid and it helped.  It is still helping and it hasn't really progressing.  The tremor is mainly in her R hand.  No tremor in the head or the legs.  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  There is a family hx of tremor due to PD in paternal grandmother and uncle.  Her balance is good.    Affected by caffeine:  no Affected by alcohol:  no Affected by stress:  no Affected by fatigue:  no Spills soup if on spoon:  no but she can notice the shake and has to concentrate if has peas on a spoon Spills glass of liquid if full:  no Affects ADL's (tying shoes, brushing teeth, etc):  no  Current/Previously tried tremor medications: propranolol  Current medications that may exacerbate tremor:  N/a  08/14/15 update:  Pt returns for f/u, accompanied by her husband who supplements the history.  Haven't seen pt in 2 years.  Pt is still on inderal 20 mg bid for tremor.  Reports that she had breast CA since last visit but did good in that regard.  Had mastectomy only.  Had recent mammogram and going to have biopsy on lesion soon.   Tremor has been slowly progressing. Husband states that she is avoiding situations more, and eating more with the L hand.  She is not spilling food.  She has trouble writing.  No trouble with brushing teeth but does shake when she does that.  No speech trouble.  Able to cook without trouble.  Thinks that  the propranolol helps but doesn't know.  R hand is more tremulous than the left.  No tremor in the legs.     11/26/15 update:  The patient is following up today regarding essential tremor.  Last visit, her propranolol was changed from 20 mg twice a day to Inderal LA, 60 mg daily.  She is doing about the same but no SE with the medication.  No new medical problems.    03/11/16 update:  The patient follows up today for her essential tremor.  I increased her Inderal last visit to 40 mg twice a day.  She reports that "I am feeling great but my tremor isn't better with the increase."  She denies any lightheadedness or near syncope.  She just got back from a trip out [place] soon to [place], [place].  06/12/16 update:  Patient follows up today for her essential tremor.  She is on propranolol, 40 mg twice a day.  She was started on primidone, 50 mg daily last visit.  She reports that it is working well, without SE.  It does make her sleepy but she takes it at bedtime.  She moved 2 months ago and has been unpacking and loves her new place.  03/31/17 update:  Pt seen in f/u for ET.  On primidone 50 mg daily and on propranolol 40 mg bid.   She has been well except she had an issue with her L knee in the summer and she didn't know what caused all the pain.  She had an MRI knee which showed torn medial meniscus.  She did PT and it helped.  She [place] her [place] because of it but she is much better.  Tremor is about the same.    09/29/17 update: The patient is seen today for follow-up for essential tremor.  We tried to decrease her propranolol last visit to 20 mg twice per day, but she did not do well with that and had worsening tremor and ended up going back to 40 mg bid.  Overall, she does think tremor is somewhat worse at the course of time.  However, she also states that it really does not interfere with her life or what she does.  She is not that bothered by it.  She is still on primidone, 50  mg daily.  I have reviewed records since our last visit.  She is scheduled to hold cataract surgery tomorrow.    04/01/18 update: Patient is seen today for follow-up of essential tremor.  Patient is currently on propranolol, 40 mg twice per day and primidone, which was increased to 50 mg twice per day since our last visit.  She reports today that "its going okay."  No SE of medication.    Outside reports reviewed: historical medical records, office notes and referral letter/letters.  Allergies  Allergen Reactions  . Statins Other (See Comments)     Leg cramps in high doses  . Bee Venom Hives    Current Outpatient Medications on File Prior to Visit  Medication Sig Dispense Refill  . letrozole (FEMARA) 2.5 MG tablet Take 1 tablet (2.5 mg total) by mouth daily. (Patient taking differently: Take 2.5 mg by mouth at bedtime. ) 90 tablet 3  . levothyroxine (SYNTHROID, LEVOTHROID) 88 MCG tablet TAKE 1 TABLET BY MOUTH EVERY DAY 90 tablet 0  . naproxen sodium (ALEVE) 220 MG tablet Take 220 mg by mouth 2 (two) times daily as needed (pain).    . primidone (MYSOLINE) 50 MG tablet Take 1 tablet (50 mg total) by mouth 2 (two) times daily. 180 tablet 1  . propranolol (INDERAL) 40 MG tablet TAKE 1 TABLET BY MOUTH TWICE A DAY 180 tablet 1  . rosuvastatin (CRESTOR) 5 MG tablet Take 1 tablet (5 mg total) by mouth daily. 90 tablet 1  . Vitamin D, Ergocalciferol, (DRISDOL) 50000 units CAPS capsule TAKE ONE CAPSULE BY MOUTH ONE TIME PER WEEK 4 capsule 2   No current facility-administered medications on file prior to visit.     Past Medical History:  Diagnosis Date  . Breast cancer ([place]) 2016   [place] - RT MASTECTOMY  . Hashimoto's thyroiditis   . History of arthroscopy of left knee 1985  . Hyperlipidemia    mild, with prior statin intolerance  . Hypothyroid   . Osteoporosis    prior Evista use x 5 yrs , 2007  . Spinal stenosis    mild  . Tremor   . Viral meningitis    72 years old    Past Surgical  History:  Procedure Laterality Date  . CATARACT EXTRACTION W/PHACO Left 09/30/2017   Procedure: CATARACT EXTRACTION PHACO AND INTRAOCULAR LENS PLACEMENT (IOC);  Surgeon: Birder Robson, MD;  Location: [facility]  ORS;  Service: Ophthalmology;  Laterality: Left;  [place] 00:40 AP% 15.7 CDE 6.38 Fluid pack lot # [id number]  . CESAREAN SECTION    . CHOLECYSTECTOMY  2006  . FOOT SURGERY     plantar fascitis, Dr. Elvina Mattes  . KNEE ARTHROSCOPY  1985   right  . MASTECTOMY Right 2016   [facility]  . SENTINEL NODE BIOPSY Right 10/10/2014   Procedure: SENTINEL NODE BIOPSY;  Surgeon: Leonie Green, MD;  Location: [facility] ORS;  Service: General;  Laterality: Right;  . SIMPLE MASTECTOMY WITH AXILLARY SENTINEL NODE BIOPSY Right 10/10/2014   Procedure: SIMPLE MASTECTOMY;  Surgeon: Leonie Green, MD;  Location: [facility] ORS;  Service: General;  Laterality: Right;  . TONSILLECTOMY      Social History   Socioeconomic History  . Marital status: Married    Spouse name: Not on file  . Number of children: Not on file  . Years of education: Not on file  . Highest education level: Not on file  Occupational History    Employer: [employer]    Comment: exercise physiologist  Social Needs  . Financial resource strain: Not on file  . Food insecurity:    Worry: Never true    Inability: Never true  . Transportation needs:    Medical: No    Non-medical: No  Tobacco Use  . Smoking status: Never Smoker  . Smokeless tobacco: Never Used  Substance and Sexual Activity  . Alcohol use: Yes    Alcohol/week: 3.0 standard drinks    Types: 3 Standard drinks or equivalent per week    Comment: social, 2 times per week  . Drug use: No  . Sexual activity: Not Currently  Lifestyle  . Physical activity:    Days per week: Not on file    Minutes per session: Not on file  . Stress: Not on file  Relationships  . Social connections:    Talks on phone: Not on file    Gets together: Not on file     Attends religious service: Not on file    Active member of club or organization: Not on file    Attends meetings of clubs or organizations: Not on file    Relationship status: Not on file  . Intimate partner violence:    Fear of current or ex partner: No    Emotionally abused: No    Physically abused: No    Forced sexual activity: No  Other Topics Concern  . Not on file  Social History Narrative  . Not on file    Family Status  Relation Name Status  . Mother  Alive       htn, has pacemaker  . Father  Deceased at age 58       AAA  . Sister  Alive       breast cancer  . Sister  Alive  . Sister  Alive  . Son  Alive       melanoma  . Daughter  Alive  . Sister  (Not Specified)  . MGF  (Not Specified)  . MGM  (Not Specified)    Review of Systems Review of Systems  Constitutional: Negative.   HENT: Negative.   Eyes: Negative.   Respiratory: Negative.   Cardiovascular: Negative.   Skin: Negative.       Objective:   VITALS:   Vitals:   04/01/18 1410  BP: 112/64  Pulse: 74  SpO2: 96%  Weight: 164 lb (  74.4 kg)  Height: 5' 7.5" (1.715 m)   Wt Readings from Last 3 Encounters:  04/01/18 164 lb (74.4 kg)  10/26/17 163 lb (73.9 kg)  09/30/17 163 lb (73.9 kg)    GEN:  The patient appears stated age and is in NAD. HEENT:  Normocephalic, atraumatic.  The mucous membranes are moist. The superficial temporal arteries are without ropiness or tenderness. CV:  RRR Lungs:  CTAB Neck/HEME:  There are no carotid bruits bilaterally.  Neurological examination:  Orientation: The patient is alert and oriented x3. Cranial nerves: There is good facial symmetry. The speech is fluent and clear. Soft palate rises symmetrically and there is no tongue deviation. Hearing is intact to conversational tone. Sensation: Sensation is intact to light touch throughout Motor: Strength is 5/5 in the bilateral upper and lower extremities.   Shoulder shrug is equal and symmetric.  There is no  pronator drift.   Movement examination: Abnormal movements: There is no tremor of the outstretched hands.  When given a weight, she has much more significant tremor.  She has significant trouble with Archimedes spirals bilaterally. Coordination:  There is no decremation with RAM's, with any form of RAMS, including alternating supination and pronation of the forearm, hand opening and closing, finger taps, heel taps and toe taps.      Assessment/Plan:   1.  Essential tremor  -talked about various options.  She will continue the propranolol, 40 mg twice per day.  We talked about increasing the primidone to 100 mg in the AM and 50 mg at night but she decided to hold on that for now.  We we will continue primidone 50 mg twice per day for now.  -She asked me about focused ultrasound.  Discussed the difference between focused ultrasound and DBS today.  2.  F/u 6 months

## 2018-04-01 ENCOUNTER — Encounter: Payer: Self-pay | Admitting: Neurology

## 2018-04-01 ENCOUNTER — Ambulatory Visit (INDEPENDENT_AMBULATORY_CARE_PROVIDER_SITE_OTHER): Payer: Medicare Other | Admitting: Neurology

## 2018-04-01 ENCOUNTER — Encounter

## 2018-04-01 VITALS — BP 112/64 | HR 74 | Ht 67.5 in | Wt 164.0 lb

## 2018-04-01 DIAGNOSIS — G250 Essential tremor: Secondary | ICD-10-CM

## 2018-04-01 NOTE — Patient Instructions (Signed)
Let me know if you want to increase your primidone

## 2018-04-23 ENCOUNTER — Other Ambulatory Visit: Payer: Self-pay | Admitting: Internal Medicine

## 2018-04-25 NOTE — Progress Notes (Signed)
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  INTERVAL HISTORY: Patient returns to clinic today for routine 33-monthevaluation.  Continues to feel well and remains asymptomatic.  She is tolerating letrozole well without significant side effects. She has no neurologic complaints.  She denies any recent fevers or illnesses.  She has a good appetite and denies weight loss. She has no chest pain or shortness of breath. She denies any nausea, vomiting, constipation, or diarrhea. She has no urinary complaints.  Patient feels that her baseline offers no specific complaints today.  REVIEW OF SYSTEMS:   Review of Systems  Constitutional: Negative.  Negative for fever, malaise/fatigue and weight loss.  Respiratory: Negative.  Negative for cough and shortness of breath.   Cardiovascular: Negative.  Negative for chest pain and leg swelling.  Gastrointestinal: Negative.  Negative for abdominal pain.  Genitourinary: Negative.  Negative for dysuria.  Musculoskeletal: Negative.  Negative for back pain.  Skin: Negative.  Negative for rash.  Neurological: Negative.  Negative for sensory change, focal weakness, weakness and headaches.  Psychiatric/Behavioral: Negative.  The patient is not nervous/anxious.     As per HPI. Otherwise, a complete review of systems is negative.  PAST MEDICAL HISTORY: Past Medical History:  Diagnosis Date  . [place]   [place]  . Hashimoto's thyroiditis   . History of arthroscopy of left knee 1985  . Hyperlipidemia    mild, with prior statin intolerance  . Hypothyroid   . Osteoporosis    prior Evista use x 5 yrs , 2007  . Spinal  stenosis    mild  . Tremor   . Viral meningitis    72years old    PAST SURGICAL HISTORY: Past Surgical History:  Procedure Laterality Date  . CATARACT EXTRACTION W/PHACO Left 09/30/2017   Procedure: CATARACT EXTRACTION PHACO AND INTRAOCULAR LENS PLACEMENT (IOC);  Surgeon: PBirder Robson MD;  Location: [facility] ORS;  Service: Ophthalmology;  Laterality: Left;  [place]:40 AP% 15.7 CDE 6.38 Fluid pack lot # [id number]  . CESAREAN SECTION    . CHOLECYSTECTOMY  2006  . FOOT SURGERY     plantar fascitis, Dr. TElvina Mattes . KNEE ARTHROSCOPY  1985   right  . MASTECTOMY Right 2016   [facility]  . SENTINEL NODE BIOPSY Right 10/10/2014   Procedure: SENTINEL NODE BIOPSY;  Surgeon: JLeonie Green MD;  Location: [facility] ORS;  Service: General;  Laterality: Right;  . SIMPLE MASTECTOMY WITH AXILLARY SENTINEL NODE BIOPSY Right 10/10/2014   Procedure: SIMPLE MASTECTOMY;  Surgeon: JLeonie Green MD;  Location: [facility] ORS;  Service: General;  Laterality: Right;  . TONSILLECTOMY      FAMILY HISTORY Family History  Problem Relation Age of Onset  . Osteoporosis Mother   . Aortic aneurysm Father   . Cancer Sister 554      Breast  . Breast cancer Sister 566 . Cancer Maternal Grandfather        breast  . Breast cancer Maternal Grandmother 86       ADVANCED DIRECTIVES:    HEALTH MAINTENANCE: Social History   Tobacco Use  . Smoking status: Never Smoker  . Smokeless tobacco: Never Used  Substance Use  Topics  . Alcohol use: Yes    Alcohol/week: 3.0 standard drinks    Types: 3 Standard drinks or equivalent per week    Comment: social, 2 times per week  . Drug use: No     Colonoscopy:  PAP:  Bone density:  Lipid panel:  Allergies  Allergen Reactions  . Statins Other (See Comments)     Leg cramps in high doses  . Bee Venom Hives    Current Outpatient Medications  Medication Sig Dispense Refill  . letrozole (FEMARA) 2.5 MG tablet Take 1 tablet (2.5 mg total) by mouth daily. (Patient  taking differently: Take 2.5 mg by mouth at bedtime. ) 90 tablet 3  . levothyroxine (SYNTHROID, LEVOTHROID) 88 MCG tablet TAKE 1 TABLET BY MOUTH EVERY DAY 90 tablet 0  . naproxen sodium (ALEVE) 220 MG tablet Take 220 mg by mouth 2 (two) times daily as needed (pain).    . primidone (MYSOLINE) 50 MG tablet Take 1 tablet (50 mg total) by mouth 2 (two) times daily. 180 tablet 1  . propranolol (INDERAL) 40 MG tablet TAKE 1 TABLET BY MOUTH TWICE A DAY 180 tablet 1  . rosuvastatin (CRESTOR) 5 MG tablet Take 1 tablet (5 mg total) by mouth daily. 90 tablet 1  . Vitamin D, Ergocalciferol, (DRISDOL) 1.25 MG (50000 UT) CAPS capsule TAKE ONE CAPSULE BY MOUTH ONCE A WEEK 4 capsule 2   No current facility-administered medications for this visit.     OBJECTIVE: Vitals:   04/26/18 1047 04/26/18 1053  BP:  116/76  Pulse:  61  Resp: 16   Temp:  97.6 F (36.4 C)     Body mass index is 25.45 kg/m.    ECOG FS:0 - Asymptomatic  General: Well-developed, well-nourished, no acute distress. Eyes: Pink conjunctiva, anicteric sclera. HEENT: Normocephalic, moist mucous membranes. Breast: Patient requested exam be deferred today. Lungs: Clear to auscultation bilaterally. Heart: Regular rate and rhythm. No rubs, murmurs, or gallops. Abdomen: Soft, nontender, nondistended. No organomegaly noted, normoactive bowel sounds. Musculoskeletal: No edema, cyanosis, or clubbing. Neuro: Alert, answering all questions appropriately. Cranial nerves grossly intact. Skin: No rashes or petechiae noted. Psych: Normal affect.  LAB RESULTS:  Lab Results  Component Value Date   NA 141 09/21/2017   K 4.9 09/21/2017   CL 105 09/21/2017   CO2 31 09/21/2017   GLUCOSE 97 09/21/2017   BUN 15 09/21/2017   CREATININE 0.65 09/21/2017   CALCIUM 9.4 09/21/2017   PROT 6.8 09/21/2017   ALBUMIN 4.0 09/21/2017   AST 17 09/21/2017   ALT 19 09/21/2017   ALKPHOS 73 09/21/2017   BILITOT 0.3 09/21/2017   GFRNONAA >60 10/03/2014    GFRAA >60 10/03/2014    Lab Results  Component Value Date   WBC 5.7 08/05/2016   NEUTROABS 3.8 08/05/2016   HGB 13.5 08/05/2016   HCT 40.8 08/05/2016   MCV 88.5 08/05/2016   PLT 335.0 08/05/2016     STUDIES: No results found.  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  PLAN:    1. 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 Given the fact that her malignancy is lobular in nature, she did not require chemotherapy. Continue letrozole for 5 years completing in June 2021, although patient has expressed interest in extending treatment  up to 7 years.  Patient's most recent mammogram on October 26, 2017, reported as BI-RADS 1.  Repeat in June 2020.  Return to clinic in 6 months for routine evaluation. 2. Osteopenia: Patient's bone mineral density from October 26, 2017 revealed a T score of -1.9 which is unchanged from one year prior.  Continue calcium and vitamin D supplementation.  Repeat in June 2020 along with mammogram as above. 3. Left breast abnormality: Patient had breast biopsy on August 21, 2015 that was negative for malignancy. Continue mammograms as above.    I spent a total of 20 minutes face-to-face with the patient of which greater than 50% of the visit was spent in counseling and coordination of care as detailed above.   Patient expressed understanding and was in agreement with this plan. She also understands that She can call clinic at any time with any questions, concerns, or complaints.    Lloyd Huger, MD   04/26/2018 1:41 PM

## 2018-04-26 ENCOUNTER — Other Ambulatory Visit: Payer: Self-pay

## 2018-04-26 ENCOUNTER — Inpatient Hospital Stay: Payer: Medicare Other | Attending: Oncology | Admitting: Oncology

## 2018-04-26 ENCOUNTER — Encounter: Payer: Self-pay | Admitting: Oncology

## 2018-04-26 ENCOUNTER — Other Ambulatory Visit: Payer: Self-pay | Admitting: *Deleted

## 2018-04-26 VITALS — BP 116/76 | HR 61 | Temp 97.6°F | Resp 16 | Ht 67.5 in | Wt 164.9 lb

## 2018-04-26 DIAGNOSIS — Z170 Estrogen receptor positive status [ER+]: Secondary | ICD-10-CM | POA: Diagnosis not present

## 2018-04-26 DIAGNOSIS — Z79811 Long term (current) use of aromatase inhibitors: Secondary | ICD-10-CM | POA: Diagnosis not present

## 2018-04-26 DIAGNOSIS — Z79899 Other long term (current) drug therapy: Secondary | ICD-10-CM | POA: Diagnosis not present

## 2018-04-26 DIAGNOSIS — H2511 Age-related nuclear cataract, right eye: Secondary | ICD-10-CM | POA: Diagnosis not present

## 2018-04-26 DIAGNOSIS — M8580 Other specified disorders of bone density and structure, unspecified site: Secondary | ICD-10-CM | POA: Insufficient documentation

## 2018-04-26 DIAGNOSIS — Z9011 Acquired absence of right breast and nipple: Secondary | ICD-10-CM | POA: Insufficient documentation

## 2018-04-26 DIAGNOSIS — Z803 Family history of malignant neoplasm of breast: Secondary | ICD-10-CM | POA: Insufficient documentation

## 2018-04-26 DIAGNOSIS — C50411 Malignant neoplasm of upper-outer quadrant of right female breast: Secondary | ICD-10-CM

## 2018-04-26 NOTE — Telephone Encounter (Signed)
Refilled: 02/10/2018 Last OV: 08/05/2017 Next OV: 08/09/2018

## 2018-04-26 NOTE — Progress Notes (Signed)
No complaints today no changes since last appointment.

## 2018-04-27 ENCOUNTER — Ambulatory Visit: Payer: Medicare Other | Admitting: Oncology

## 2018-04-30 MED ORDER — VITAMIN D (ERGOCALCIFEROL) 1.25 MG (50000 UNIT) PO CAPS: ORAL_CAPSULE | ORAL | Status: DC

## 2018-05-17 ENCOUNTER — Telehealth: Payer: Self-pay | Admitting: Neurology

## 2018-05-17 NOTE — Telephone Encounter (Signed)
DMV forms completed and faxed to [phone] and [phone] with confirmation received.

## 2018-05-18 ENCOUNTER — Other Ambulatory Visit: Payer: Self-pay | Admitting: Neurology

## 2018-05-25 ENCOUNTER — Other Ambulatory Visit: Payer: Self-pay | Admitting: Internal Medicine

## 2018-05-25 DIAGNOSIS — E039 Hypothyroidism, unspecified: Secondary | ICD-10-CM

## 2018-05-25 DIAGNOSIS — E785 Hyperlipidemia, unspecified: Secondary | ICD-10-CM

## 2018-05-25 DIAGNOSIS — E559 Vitamin D deficiency, unspecified: Secondary | ICD-10-CM

## 2018-05-25 DIAGNOSIS — R7301 Impaired fasting glucose: Secondary | ICD-10-CM

## 2018-05-28 ENCOUNTER — Other Ambulatory Visit: Payer: Self-pay | Admitting: Oncology

## 2018-06-25 ENCOUNTER — Other Ambulatory Visit: Payer: Self-pay | Admitting: Internal Medicine

## 2018-08-02 ENCOUNTER — Other Ambulatory Visit: Payer: Self-pay | Admitting: Neurology

## 2018-08-09 ENCOUNTER — Other Ambulatory Visit: Payer: Self-pay

## 2018-08-09 ENCOUNTER — Ambulatory Visit (INDEPENDENT_AMBULATORY_CARE_PROVIDER_SITE_OTHER): Payer: Medicare Other | Admitting: Internal Medicine

## 2018-08-09 ENCOUNTER — Ambulatory Visit: Payer: Medicare Other

## 2018-08-09 ENCOUNTER — Other Ambulatory Visit: Payer: Medicare Other

## 2018-08-09 ENCOUNTER — Encounter: Payer: Medicare Other | Admitting: Internal Medicine

## 2018-08-09 ENCOUNTER — Encounter: Payer: Self-pay | Admitting: Internal Medicine

## 2018-08-09 VITALS — BP 112/78 | HR 69 | Temp 98.4°F | Resp 15 | Ht 67.5 in | Wt 161.1 lb

## 2018-08-09 DIAGNOSIS — E039 Hypothyroidism, unspecified: Secondary | ICD-10-CM | POA: Diagnosis not present

## 2018-08-09 DIAGNOSIS — R7301 Impaired fasting glucose: Secondary | ICD-10-CM | POA: Diagnosis not present

## 2018-08-09 DIAGNOSIS — E785 Hyperlipidemia, unspecified: Secondary | ICD-10-CM

## 2018-08-09 DIAGNOSIS — E559 Vitamin D deficiency, unspecified: Secondary | ICD-10-CM

## 2018-08-09 DIAGNOSIS — R7303 Prediabetes: Secondary | ICD-10-CM

## 2018-08-09 DIAGNOSIS — Z0000 Encounter for general adult medical examination without abnormal findings: Secondary | ICD-10-CM

## 2018-08-09 DIAGNOSIS — E875 Hyperkalemia: Secondary | ICD-10-CM | POA: Diagnosis not present

## 2018-08-09 LAB — COMPREHENSIVE METABOLIC PANEL
ALT: 17 U/L (ref 0–35)
AST: 14 U/L (ref 0–37)
Albumin: 4.2 g/dL (ref 3.5–5.2)
Alkaline Phosphatase: 90 U/L (ref 39–117)
BUN: 15 mg/dL (ref 6–23)
CO2: 31 mEq/L (ref 19–32)
Calcium: 9.5 mg/dL (ref 8.4–10.5)
Chloride: 104 mEq/L (ref 96–112)
Creatinine, Ser: 0.69 mg/dL (ref 0.40–1.20)
GFR: 83.99 mL/min (ref >60.00)
Glucose, Bld: 101 mg/dL — ABNORMAL HIGH (ref 70–99)
Potassium: 5.4 mEq/L — ABNORMAL HIGH (ref 3.5–5.1)
Sodium: 140 mEq/L (ref 135–145)
Total Bilirubin: 0.3 mg/dL (ref 0.2–1.2)
Total Protein: 6.9 g/dL (ref 6.0–8.3)

## 2018-08-09 LAB — MICROALBUMIN / CREATININE URINE RATIO
Creatinine,U: 112.3 mg/dL
Microalb Creat Ratio: 1.1 mg/g (ref 0.0–30.0)
Microalb, Ur: 1.3 mg/dL (ref 0.0–1.9)

## 2018-08-09 LAB — LIPID PANEL
Cholesterol: 160 mg/dL (ref 0–200)
HDL: 39 mg/dL — ABNORMAL LOW (ref >39.00)
LDL Cholesterol: 95 mg/dL (ref 0–99)
NonHDL: 121.27
Total CHOL/HDL Ratio: 4
Triglycerides: 129 mg/dL (ref 0.0–149.0)
VLDL: 25.8 mg/dL (ref 0.0–40.0)

## 2018-08-09 LAB — VITAMIN D 25 HYDROXY (VIT D DEFICIENCY, FRACTURES): VITD: 44.98 ng/mL (ref 30.00–100.00)

## 2018-08-09 LAB — TSH: TSH: 0.55 u[IU]/mL (ref 0.35–4.50)

## 2018-08-09 LAB — HEMOGLOBIN A1C: Hgb A1c MFr Bld: 6 % (ref 4.6–6.5)

## 2018-08-09 MED ORDER — DOXYCYCLINE HYCLATE 100 MG PO TABS: 100.0000 mg | ORAL_TABLET | Freq: Two times a day (BID) | ORAL | 0 refills | Status: DC

## 2018-08-09 NOTE — Patient Instructions (Signed)
[place] (Health Maintenance, Female) Un estilo de vida saludable y los cuidados preventivos pueden favorecer considerablemente a la salud y Musician. Pregunte a su mdico cul es el cronograma de exmenes peridicos apropiado para usted. Esta es una buena oportunidad para consultarlo sobre cmo prevenir enfermedades y [place] sano. Adems de los controles, hay muchas otras cosas que puede hacer usted mismo. Los expertos han realizado numerosas investigaciones [employer] cambios en el estilo de vida y las medidas de prevencin que, [place], lo ayudarn a mantenerse sano. Solicite a su mdico ms informacin. EL PESO Y LA DIETA Consuma una dieta saludable.  Asegrese de [employer] verduras, frutas, productos lcteos de bajo contenido de [place] y Advertising account planner.  No consuma muchos alimentos de alto contenido de grasas slidas, azcares agregados o sal.  Realice actividad fsica con regularidad. Esta es una de las prcticas ms importantes que puede hacer por su salud. ? La Delorise Shiner de los adultos deben hacer ejercicio durante al menos 124mnutos por semana. El ejercicio debe aumentar la frecuencia cardaca y pActorla transpiracin (ejercicio de [place]. ? La mayora de los adultos tambin deben hacer ejercicios de elongacin al mToysRusveces a la semana. Agregue esto al su plan de ejercicio de intensidad moderada. Mantenga un peso saludable.  El ndice de masa corporal ([facility] es una medida que puede utilizarse para identificar posibles problemas de pEast [place] Proporciona una estimacin de la grasa corporal basndose en el peso y la altura. Su mdico puede ayudarle a dRadiation protection practitionerISouth Endy a lScientist, forensico mTheatre managerun peso saludable.  Para las mujeres de 20aos o ms: ? Un IJohn R. Oishei Children'S Hospitalmenor de 18,5 se considera bajo peso. ? Un ICumberland County Hospitalentre 18,5 y 24,9 es normal. ? Un IPelham Medical Centerentre 25 y 29,9 se considera sobrepeso. ? Un IMC de 30 o ms se considera  obesidad. Observe los niveles de colesterol y lpidos en la sangre.  Debe comenzar a rEnglish as a second language teacherde lpidos y cResearch officer, trade unionen la sangre a los 20aos y luego repetirlos cada 516aos  Es posible que nAutomotive engineerlos niveles de colesterol con mayor frecuencia si: ? Sus niveles de lpidos y colesterol son altos. ? Es mayor de 527CWC ? Presenta un alto riesgo de padecer enfermedades cardacas. DETECCIN DE CNCER Cncer de pulmn  Se recomienda realizar exmenes de deteccin de cncer de pulmn a personas adultas entre 574y 892aos que estn en riesgo de dHorticulturist, commercialde pulmn por sus antecedentes de consumo de tabaco.  Se recomienda una tomografa computarizada de baja dosis de los pulmones todos los aos a las personas que: ? Fuman actualmente. ? Hayan dejado el hbito en algn momento en los ltimos 15aos. ? Hayan fumado durante 30aos un paquete diario. Un paquete-ao equivale a fumar un promedio de un paquete de cigarrillos diario durante un ao.  Los exmenes de deteccin anuales deben continuar hasta que hayan pasado 15aos desde que dej de fumar.  Ya no debern realizarse si tiene un problema de salud que le impida recibir tratamiento para eScience writerde pulmn. Cncer de mama  Practique la autoconciencia de la mama. Esto significa reconocer la apariencia normal de sus mamas y cmo las siente.  Tambin significa realizar autoexmenes regulares de lJohnson & Johnson Informe a su mdico sobre cualquier cambio, sin importar cun pequeo sea.  Si tiene entre 20 y 363aos, un mdico debe realizarle un examen clnico de las mamas como parte del examen regular de s[place] cada 1 a  3aos.  Si tiene 40aos o ms, debe realizarse un examen clnico de las mamas todos los aos. Tambin considere realizarse una radiografa de las mamas (mamografa) todos los aos.  Si tiene antecedentes familiares de cncer de mama, hable con su mdico para someterse a un estudio gentico.  Si  tiene alto riesgo de padecer cncer de mama, hable con su mdico para someterse a una resonancia magntica y una mamografa todos los aos.  La evaluacin del gen del cncer de mama (BRCA) se recomienda a mujeres que tengan familiares con cnceres relacionados con el BRCA. Los cnceres relacionados con el BRCA incluyen los siguientes: ? Mama. ? Ovario. ? Trompas. ? Cnceres de peritoneo.  Los resultados de la evaluacin determinarn la necesidad de asesoramiento gentico y de anlisis de BRCA1 y BRCA2. Cncer de cuello del tero El mdico puede recomendarle que se haga pruebas peridicas de deteccin de cncer de los rganos de la pelvis (ovarios, tero y vagina). Estas pruebas incluyen un examen plvico, que abarca controlar si se produjeron cambios microscpicos en la superficie del cuello del tero (prueba de Papanicolaou). Pueden recomendarle que se haga estas pruebas cada 3aos, a partir de los 21aos.  A las mujeres que tienen entre 30 y 65aos, los mdicos pueden recomendarles que se sometan a exmenes plvicos y pruebas de Papanicolaou cada 3aos, o a la prueba de Papanicolaou y el examen plvico en combinacin con estudios de deteccin del virus del papiloma humano (VPH) cada 5aos. Algunos tipos de VPH aumentan el riesgo de padecer cncer de cuello del tero. La prueba para la deteccin del VPH tambin puede realizarse a mujeres de cualquier edad cuyos resultados de la prueba de Papanicolaou no sean claros.  Es posible que otros mdicos no recomienden exmenes de deteccin a mujeres no embarazadas que se consideran sujetos de bajo riesgo de padecer cncer de pelvis y que no tienen sntomas. Pregntele al mdico si un examen plvico de deteccin es adecuado para usted.  Si ha recibido un tratamiento para el cncer cervical o una enfermedad que podra causar cncer, necesitar realizarse una prueba de Papanicolaou y controles durante al menos 20 aos de concluido el tratamiento. Si no se  ha hecho el Papanicolaou con regularidad, debern volver a evaluarse los factores de riesgo (como tener un nuevo compaero sexual), para determinar si debe realizarse los estudios nuevamente. Algunas mujeres sufren problemas mdicos que aumentan la probabilidad de contraer cncer de cuello del tero. En estos casos, el mdico podr indicar que se realicen controles y pruebas de Papanicolaou con ms frecuencia. Cncer colorrectal  Este tipo de cncer puede detectarse y a menudo prevenirse.  Por lo general, los estudios de rutina se deben comenzar a hacer a partir de los 50 aos y hasta los 75 aos.  Sin embargo, el mdico podr aconsejarle que lo haga antes, si tiene factores de riesgo para el cncer de colon.  Tambin puede recomendarle que use un kit de prueba para hallar sangre oculta en la materia fecal.  Es posible que se use una pequea cmara en el extremo de un tubo para examinar directamente el colon (sigmoidoscopia o colonoscopia) a fin de detectar formas tempranas de cncer colorrectal.  Los exmenes de rutina generalmente comienzan a los 50aos.  El examen directo del colon se debe repetir cada 5 a 10aos hasta los 75aos. Sin embargo, es posible que se realicen exmenes con mayor frecuencia, si se detectan formas tempranas de plipos precancerosos o pequeos bultos. Cncer de piel  Revise la piel   de la cabeza a los pies con regularidad.  Informe a su mdico si aparecen nuevos lunares o los que tiene se modifican, especialmente en su forma y color.  Tambin notifique al mdico si tiene un lunar que es ms grande que el tamao de una goma de lpiz.  Siempre use pantalla solar. Aplique pantalla solar de manera libre y repetida a lo largo del da.  Protjase usando mangas y pantalones largos, un sombrero de ala ancha y gafas para el sol, siempre que se encuentre en el exterior. ENFERMEDADES CARDACAS, DIABETES E HIPERTENSIN ARTERIAL  La hipertensin arterial causa  enfermedades cardacas y aumenta el riesgo de ictus. La hipertensin arterial es ms probable en los siguientes casos: ? Las personas que tienen la presin arterial en el extremo del rango normal (100-139/85-89 mm Hg). ? Las personas con sobrepeso u obesidad. ? Las personas afroamericanas.  Si usted tiene entre 18 y 39 aos, debe medirse la presin arterial cada 3 a 5 aos. Si usted tiene 40 aos o ms, debe medirse la presin arterial todos los aos. Debe medirse la presin arterial dos veces: una vez cuando est en un hospital o una clnica y la otra vez cuando est en otro sitio. Registre el promedio de las dos mediciones. Para controlar su presin arterial cuando no est en un hospital o una clnica, puede usar lo siguiente: ? Una mquina automtica para medir la presin arterial en una farmacia. ? Un monitor para medir la presin arterial en el hogar.  Si tiene entre 55 y 79 aos, consulte a su mdico si debe tomar aspirina para prevenir el ictus.  Realcese exmenes de deteccin de la diabetes con regularidad. Esto incluye la toma de una muestra de sangre para controlar el nivel de azcar en la sangre durante el ayuno. ? Si tiene un peso normal y un bajo riesgo de padecer diabetes, realcese este anlisis cada tres aos despus de los 45aos. ? Si tiene sobrepeso y un alto riesgo de padecer diabetes, considere someterse a este anlisis antes o con mayor frecuencia. PREVENCIN DE INFECCIONES HepatitisB  Si tiene un riesgo ms alto de contraer hepatitis B, debe someterse a un examen de deteccin de este virus. Se considera que tiene un alto riesgo de contraer hepatitis B si: ? Naci en un pas donde la hepatitis B es frecuente. Pregntele a su mdico qu pases son considerados de alto riesgo. ? Sus padres nacieron en un pas de alto riesgo y usted no recibi una vacuna que lo proteja contra la hepatitis B (vacuna contra la hepatitis B). ? Tiene VIH o sida. ? Usa agujas para inyectarse  drogas. ? Vive con alguien que tiene hepatitis B. ? Ha tenido sexo con alguien que tiene hepatitis B. ? Recibe tratamiento de hemodilisis. ? Toma ciertos medicamentos para el cncer, trasplante de rganos y afecciones autoinmunitarias. Hepatitis C  Se recomienda un anlisis de sangre para: ? Todos los que nacieron entre 1945 y 1965. ? Todas las personas que tengan un riesgo de haber contrado hepatitis C. Enfermedades de transmisin sexual (ETS).  Debe realizarse pruebas de deteccin de enfermedades de transmisin sexual (ETS), incluidas gonorrea y clamidia si: ? Es sexualmente activo y es menor de 24aos. ? Es mayor de 24aos, y el mdico le informa que corre riesgo de tener este tipo de infecciones. ? La actividad sexual ha cambiado desde que le hicieron la ltima prueba de deteccin y tiene un riesgo mayor de tener clamidia o gonorrea. Pregntele al mdico si usted   tiene riesgo.  Si no tiene el VIH, pero corre riesgo de infectarse por el virus, se recomienda tomar diariamente un medicamento recetado para evitar la infeccin. Esto se conoce como profilaxis previa a la exposicin. Se considera que est en riesgo si: ? Es [place] sexualmente y no [place] preservativos habitualmente o no conoce el estado del VIH de sus Advertising copywriter. ? Se inyecta drogas. ? Es [place] sexualmente con Ardelia Mems pareja que tiene VIH. Consulte a su mdico para saber si tiene un alto riesgo de infectarse por el VIH. Si opta por comenzar la profilaxis previa a la exposicin, primero debe realizarse anlisis de deteccin del VIH. Luego, le harn anlisis cada 54mses mientras est tomando los medicamentos para la profilaxis previa a la exposicin. E[facility] Si es premenopusica y puede quedar eArbutus solicite a su mdico asesoramiento previo a la concepcin.  Si puede quedar embarazada, tome 400 a [id number]([id number]) de cido fAnheuser-Busch  Si desea evitar el embarazo, hable con su mdico sobre el  control de la natalidad (anticoncepcin). OSTEOPOROSIS Y MENOPAUSIA  La osteoporosis es una enfermedad en la que los huesos pierden los minerales y la fuerza por el avance de la edad. El resultado pueden ser fracturas graves en los hLeaf River El riesgo de osteoporosis puede identificarse con uArdelia Memsprueba de densidad sea.  Si tiene 65aos o ms, o si est en riesgo de sufrir osteoporosis y fracturas, pregunte a su mdico si debe someterse a exmenes.  Consulte a su mdico si debe tomar un suplemento de calcio o de vitamina D para reducir el riesgo de osteoporosis.  La menopausia puede presentar ciertos sntomas fsicos y rGaffer  La terapia de reemplazo hormonal puede reducir algunos de estos sntomas y rGaffer Consulte a su mdico para saber si la terapia de reemplazo hormonal es conveniente para usted. INSTRUCCIONES PARA EL CUIDADO EN EL HOGAR  Realcese los estudios de rutina de la salud, dentales y de lPublic librarian  MKennard  No consuma ningn producto que contenga tabaco, lo que incluye cigarrillos, tabaco de mHigher education careers advisero cPsychologist, sport and exercise  Si est embarazada, no beba alcohol.  Si est amamantando, reduzca el consumo de alcohol y la frecuencia con la que consume.  Si es mujer y no est embarazada limite el consumo de alcohol a no ms de 1 medida por da. Una medida equivale a 12onzas de cerveza, 5onzas de vino o 1onzas de bebidas alcohlicas de alta graduacin.  No consuma drogas.  No comparta agujas.  Solicite ayuda a su mdico si necesita apoyo o informacin para abandonar las drogas.  Informe a su mdico si a menudo se siente deprimido.  Notifique a su mdico si alguna vez ha sido vctima de abuso o si no se siente seguro en su hogar. Esta informacin no tiene cMarine scientistel consejo del mdico. Asegrese de hacerle al mdico cualquier pregunta que tenga. Document Released: 04/24/2011 Document Revised: 05/26/2014 Document Reviewed:  02/06/2015 Elsevier Interactive Patient Education  2019 [employer]

## 2018-08-09 NOTE — Progress Notes (Signed)
Patient ID: Tiffany Chang, female    DOB: Sep 03, 1945  Age: 73 y.o. MRN: [id number]  The patient is here for follow up and  management of other chronic and acute problems.    Annual DXA on Femara year 4   ]walking daily,  Excellent diet.  Doing Tai Chi and walking daly  Mother will be 53 in May  Living at [facility]   The risk factors are reflected in the social history.  The roster of all physicians providing medical care to patient - is listed in the Snapshot section of the chart.  Activities of daily living:  The patient is 100% independent in all ADLs: dressing, toileting, feeding as well as independent mobility  Home safety : The patient has smoke detectors in the home. They wear seatbelts.  There are no firearms at home. There is no violence in the home.   There is no risks for hepatitis, STDs or HIV. There is no   history of blood transfusion. They have no travel history to infectious disease endemic areas of the world.  The patient has seen their dentist in the last six month. They have seen their eye doctor in the last year. They admit to slight hearing difficulty with regard to whispered voices and some television programs.  They have deferred audiologic testing in the last year.  They do not  have excessive sun exposure. Discussed the need for sun protection: hats, long sleeves and use of sunscreen if there is significant sun exposure.   Diet: the importance of a healthy diet is discussed. They do have a healthy diet.  The benefits of regular aerobic exercise were discussed. She walks 4 times per week ,  20 minutes.   Depression screen: there are no signs or vegative symptoms of depression- irritability, change in appetite, anhedonia, sadness/tearfullness.  Cognitive assessment: the patient manages all their financial and personal affairs and is actively engaged. They could relate day,date,year and events; recalled 2/3 objects at 3 minutes; performed clock-face test  normally.  The following portions of the patient's history were reviewed and updated as appropriate: allergies, current medications, past family history, past medical history,  past surgical history, past social history  and problem list.  Visual acuity was not assessed per patient preference since she has regular follow up with her ophthalmologist. Hearing and body mass index were assessed and reviewed.   During the course of the visit the patient was educated and counseled about appropriate screening and preventive services including : fall prevention , diabetes screening, nutrition counseling, colorectal cancer screening, and recommended immunizations.    CC: Diagnoses of Vitamin D deficiency, Impaired fasting glucose, Hyperlipidemia LDL goal <160, Acquired hypothyroidism, Encounter for preventive health examination, and Prediabetes were pertinent to this visit. Tolerating crestor dAILY SEES [name] FOR TREMOR  CAN'T EAT SOUP  Left cataract re,pved May 2019 porfilio   Some central adiposity  Overdue for colonoscopy 5 yr follow up was due in 2019 Hemorrhoids    History Joany has a past medical history of Breast cancer ([place]) (2016), Hashimoto's thyroiditis, History of arthroscopy of left knee (1985), Hyperlipidemia, Hypothyroid, Osteoporosis, Spinal stenosis, Tremor, and Viral meningitis.   She has a past surgical history that includes Foot surgery; Knee arthroscopy (1985); Cholecystectomy (2006); Tonsillectomy; Cesarean section; Simple mastectomy with axillary sentinel node biopsy (Right, 10/10/2014); Sentinel node biopsy (Right, 10/10/2014); Mastectomy (Right, 2016); and Cataract extraction w/PHACO (Left, 09/30/2017).   Her family history includes Aortic aneurysm in her father; Breast  cancer (age of onset: 58) in her sister; Breast cancer (age of onset: 60) in her maternal grandmother; Cancer in her maternal grandfather; Cancer (age of onset: 60) in her sister; Osteoporosis in her mother.She  reports that she has never smoked. She has never used smokeless tobacco. She reports current alcohol use of about 3.0 standard drinks of alcohol per week. She reports that she does not use drugs.  Outpatient Medications Prior to Visit  Medication Sig Dispense Refill  . letrozole (FEMARA) 2.5 MG tablet TAKE 1 TABLET BY MOUTH EVERY DAY 90 tablet 3  . levothyroxine (SYNTHROID, LEVOTHROID) 88 MCG tablet TAKE 1 TABLET BY MOUTH EVERY DAY 90 tablet 0  . naproxen sodium (ALEVE) 220 MG tablet Take 220 mg by mouth 2 (two) times daily as needed (pain).    . primidone (MYSOLINE) 50 MG tablet Take 1 tablet (50 mg total) by mouth 2 (two) times daily. 180 tablet 1  . propranolol (INDERAL) 40 MG tablet TAKE 1 TABLET BY MOUTH TWICE A DAY 180 tablet 1  . rosuvastatin (CRESTOR) 5 MG tablet Take 1 tablet (5 mg total) by mouth daily. 90 tablet 1  . Vitamin D, Ergocalciferol, (DRISDOL) 1.25 MG (50000 UT) CAPS capsule TAKE ONE CAPSULE BY MOUTH ONCE A WEEK 12 capsule 01   No facility-administered medications prior to visit.     Review of Systems   Patient denies headache, fevers, malaise, unintentional weight loss, skin rash, eye pain, sinus congestion and sinus pain, sore throat, dysphagia,  hemoptysis , cough, dyspnea, wheezing, chest pain, palpitations, orthopnea, edema, abdominal pain, nausea, melena, diarrhea, constipation, flank pain, dysuria, hematuria, urinary  Frequency, nocturia, numbness, tingling, seizures,  Focal weakness, Loss of consciousness,  Tremor, insomnia, depression, anxiety, and suicidal ideation.     Objective:  BP 112/78 (BP Location: Left Arm, Patient Position: Sitting, Cuff Size: Normal)   Pulse 69   Temp 98.4 F (36.9 C) (Oral)   Resp 15   Ht 5' 7.5" (1.715 m)   Wt 161 lb 1.9 oz (73.1 kg)   SpO2 96%   BMI 24.86 kg/m   Physical Exam   General appearance: alert, cooperative and appears stated age Head: Normocephalic, without obvious abnormality, atraumatic Eyes:  conjunctivae/corneas clear. PERRL, EOM's intact. Fundi benign. Ears: normal TM's and external ear canals both ears Nose: Nares normal. Septum midline. Mucosa normal. No drainage or sinus tenderness. Throat: lips, mucosa, and tongue normal; teeth and gums normal Neck: no adenopathy, no carotid bruit, no JVD, supple, symmetrical, trachea midline and thyroid not enlarged, symmetric, no tenderness/mass/nodules Lungs: clear to auscultation bilaterally Breasts: right mastectomy, left breast without  masses or tenderness Heart: regular rate and rhythm, S1, S2 normal, no murmur, click, rub or gallop Abdomen: soft, non-tender; bowel sounds normal; no masses,  no organomegaly Extremities: extremities normal, atraumatic, no cyanosis or edema Pulses: 2+ and symmetric Skin: Skin color, texture, turgor normal. No rashes or lesions Neurologic: Alert and oriented X 3, normal strength and tone. Normal symmetric reflexes. Normal coordination and gait.      Assessment & Plan:   Problem List Items Addressed This Visit    Vitamin D deficiency   Encounter for preventive health examination    age appropriate education and counseling updated, referrals for preventative services and immunizations addressed, dietary and smoking counseling addressed, most recent labs reviewed.  I have personally reviewed and have noted:  1) the patient's medical and social history 2) The pt's use of alcohol, tobacco, and illicit drugs 3) The patient's current  medications and supplements 4) Functional ability including ADL's, fall risk, home safety risk, hearing and visual impairment 5) Diet and physical activities 6) Evidence for depression or mood disorder 7) The patient's height, weight, and BMI have been recorded in the chart  I have made referrals, and provided counseling and education based on review of the above      Hyperlipidemia LDL goal <160    Using the Framingham risk calculator,  Her 10 year risk of coronary  artery disease was 19%. she is tolerating low dose Crestor and LDL is now < 100.  Lab Results  Component Value Date   CHOL 160 08/09/2018   HDL 39.00 (L) 08/09/2018   LDLCALC 95 08/09/2018   LDLDIRECT 218.0 08/05/2017   TRIG 129.0 08/09/2018   CHOLHDL 4 08/09/2018         Acquired hypothyroidism    Thyroid function is WNL on current dose.  No current changes needed.   Lab Results  Component Value Date   TSH 0.55 08/09/2018         Prediabetes    Her  random glucose is not  elevated but her A1cof 6.0  suggests she is at risk for developing diabetes.  She is  Following a low glycemic index diet and particpating regularly in an aerobic  Exercise.       Other Visit Diagnoses    Impaired fasting glucose         A total of 40 minutes was spent with patient more than half of which was spent in counseling patient on the above mentioned issues , reviewing and explaining recent labs and imaging studies done, and coordination of care.  I am having William Dalton start on doxycycline. I am also having her maintain her naproxen sodium, rosuvastatin, Vitamin D (Ergocalciferol), primidone, letrozole, levothyroxine, and propranolol.  Meds ordered this encounter  Medications  . doxycycline (VIBRA-TABS) 100 MG tablet    Sig: Take 1 tablet (100 mg total) by mouth 2 (two) times daily.    Dispense:  20 tablet    Refill:  0    There are no discontinued medications.  Follow-up: No follow-ups on file.   Crecencio Mc, MD

## 2018-08-11 DIAGNOSIS — R7303 Prediabetes: Secondary | ICD-10-CM | POA: Insufficient documentation

## 2018-08-11 NOTE — Assessment & Plan Note (Signed)
Thyroid function is WNL on current dose.  No current changes needed.   Lab Results  Component Value Date   TSH 0.55 08/09/2018

## 2018-08-11 NOTE — Assessment & Plan Note (Signed)
Her  random glucose is not  elevated but her A1cof 6.0  suggests she is at risk for developing diabetes.  She is  Following a low glycemic index diet and particpating regularly in an aerobic  Exercise.

## 2018-08-11 NOTE — Assessment & Plan Note (Signed)
Using the Framingham risk calculator,  Her 10 year risk of coronary artery disease was 19%. she is tolerating low dose Crestor and LDL is now < 100.  Lab Results  Component Value Date   CHOL 160 08/09/2018   HDL 39.00 (L) 08/09/2018   LDLCALC 95 08/09/2018   LDLDIRECT 218.0 08/05/2017   TRIG 129.0 08/09/2018   CHOLHDL 4 08/09/2018

## 2018-08-11 NOTE — Assessment & Plan Note (Signed)

## 2018-09-10 NOTE — Progress Notes (Signed)
Virtual Visit via Video Note The purpose of this virtual visit is to provide medical care while limiting exposure to the novel coronavirus.    Consent was obtained for video visit:  Yes.   Answered questions that patient had about telehealth interaction:  Yes.   I discussed the limitations, risks, security and privacy concerns of performing an evaluation and management service by telemedicine. I also discussed with the patient that there may be a patient responsible charge related to this service. The patient expressed understanding and agreed to proceed.  Pt location: Home Physician Location: office Name of referring provider:  Crecencio Mc, MD I connected with Tiffany Chang at patients initiation/request on 09/13/2018 at  2:30 PM EDT by video enabled telemedicine application and verified that I am speaking with the correct person using two identifiers. Pt MRN:  [id number] Pt DOB:  1945/10/18 Video Participants:  Tiffany Chang;  [name]   History of Present Illness:  Patient is seen today in follow-up for essential tremor.  She is on primidone, 50 mg twice per day and propranolol, 40 mg twice per day.  Tremor has been about the same, with getting better and worse days.  If she is in a hurry things are worse.   Husband thinks that tremor has been stable.  Was doing tai chi for exercise.  On hold since pandemic but is doing walking for exercise.  For some reason, DMV had last filled out forms for her license, despite the fact that she has never had epilepsy (presumed because she was on primidone).  She has gotten the all clear on that.     Observations/Objective:   There were no vitals filed for this visit. GEN:  The patient appears stated age and is in NAD.  Neurological examination:  Orientation: The patient is alert and oriented x3. Cranial nerves: There is good facial symmetry. There is no facial hypomimia.  The speech is fluent and clear. Soft palate rises symmetrically and  there is no tongue deviation. Hearing is intact to conversational tone. Motor: Strength is at least antigravity x 4.   Shoulder shrug is equal and symmetric.  There is no pronator drift.  Movement examination: Tone: unable Abnormal movements: No rest tremor.  She has mild tremor of the outstretched hands.  She has slight increased tremor when she is asked to hold a water glass. Coordination:  There is no decremation with RAM's, with hand opening and closing bilaterally. Gait and Station: Not tested today.    Assessment and Plan:   1.  Essential tremor  -Continue primidone, 50 mg twice per day.  Discussed increasing it, but patient stated that as long as she could email me if she felt the need, she decided to stay at the current dosage.  Will let me know when she needs a refill.  -Continue propranolol, 40 mg twice per day.  Follow Up Instructions:  6-9 month f/u  -I discussed the assessment and treatment plan with the patient. The patient was provided an opportunity to ask questions and all were answered. The patient agreed with the plan and demonstrated an understanding of the instructions.   The patient was advised to call back or seek an in-person evaluation if the symptoms worsen or if the condition fails to improve as anticipated.    Total Time spent in visit with the patient was:  15 min, of which more than 50% of the time was spent in counseling and/or coordinating care on safety.  Pt understands and agrees with the plan of care outlined.     Alonza Bogus, DO

## 2018-09-13 ENCOUNTER — Telehealth (INDEPENDENT_AMBULATORY_CARE_PROVIDER_SITE_OTHER): Payer: Medicare Other | Admitting: Neurology

## 2018-09-13 ENCOUNTER — Other Ambulatory Visit: Payer: Self-pay

## 2018-09-13 ENCOUNTER — Encounter: Payer: Self-pay | Admitting: General Surgery

## 2018-09-13 DIAGNOSIS — G250 Essential tremor: Secondary | ICD-10-CM

## 2018-09-21 ENCOUNTER — Ambulatory Visit: Payer: Medicare Other | Admitting: Neurology

## 2018-10-06 ENCOUNTER — Encounter: Payer: Self-pay | Admitting: Oncology

## 2018-10-08 ENCOUNTER — Other Ambulatory Visit: Payer: Self-pay | Admitting: Internal Medicine

## 2018-10-27 ENCOUNTER — Other Ambulatory Visit: Payer: Self-pay | Admitting: Internal Medicine

## 2018-10-31 NOTE — Progress Notes (Signed)
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  INTERVAL HISTORY: Patient returns to clinic today for routine 56-monthevaluation and discussion of her mammogram and bone mineral density results.  She continues to feel well and remains asymptomatic.  She continues to tolerate letrozole without significant side effects. She has no neurologic complaints.  She denies any recent fevers or illnesses.  She has a good appetite and denies weight loss. She has no chest pain or shortness of breath. She denies any nausea, vomiting, constipation, or diarrhea. She has no urinary complaints.  Patient feels at her baseline offers no specific complaints today.  REVIEW OF SYSTEMS:   Review of Systems  Constitutional: Negative.  Negative for fever, malaise/fatigue and weight loss.  Respiratory: Negative.  Negative for cough and shortness of breath.   Cardiovascular: Negative.  Negative for chest pain and leg swelling.  Gastrointestinal: Negative.  Negative for abdominal pain.  Genitourinary: Negative.  Negative for dysuria.  Musculoskeletal: Negative.  Negative for back pain.  Skin: Negative.  Negative for rash.  Neurological: Negative.  Negative for sensory change, focal weakness, weakness and headaches.  Psychiatric/Behavioral: Negative.  The patient is not nervous/anxious.     As per HPI. Otherwise, a complete review of systems is negative.  PAST MEDICAL HISTORY: Past Medical History:  Diagnosis Date   Breast cancer ([place] 2016   [place] MASTECTOMY   Hashimoto's thyroiditis    History of arthroscopy of left knee 1985   Hyperlipidemia    mild, with prior statin intolerance   Hypothyroid     Osteoporosis    prior Evista use x 5 yrs , 2007   Spinal stenosis    mild   Tremor    Viral meningitis    73years old    PAST SURGICAL HISTORY: Past Surgical History:  Procedure Laterality Date   CATARACT EXTRACTION W/PHACO Left 09/30/2017   Procedure: CATARACT EXTRACTION PHACO AND INTRAOCULAR LENS PLACEMENT ([place];  Surgeon: PBirder Robson MD;  Location: [facility] ORS;  Service: Ophthalmology;  Laterality: Left;  [place]:40 AP% 15.7 CDE 6.38 Fluid pack lot # [id number]   CESAREAN SECTION     CHOLECYSTECTOMY  2006   FOOT SURGERY     plantar fascitis, Dr. TElvina Mattes  KNEE ARTHROSCOPY  1985   right   [place] Right [place]   [place]  SENTINEL NODE BIOPSY Right 10/10/2014   Procedure: SENTINEL NODE BIOPSY;  Surgeon: JLeonie Green MD;  Location: [facility] ORS;  Service: General;  Laterality: Right;   SIMPLE MASTECTOMY WITH AXILLARY SENTINEL NODE BIOPSY Right 10/10/2014   Procedure: SIMPLE MASTECTOMY;  Surgeon: JLeonie Green MD;  Location: [facility] ORS;  Service: General;  Laterality: Right;   TONSILLECTOMY      FAMILY HISTORY Family History  Problem Relation Age of Onset   Osteoporosis Mother    Aortic aneurysm Father    Cancer Sister 533      Breast   Breast cancer Sister 522  Cancer Maternal Grandfather        breast   Breast cancer Maternal Grandmother 86       ADVANCED DIRECTIVES:    HEALTH MAINTENANCE: Social History   Tobacco Use   Smoking status:  Never Smoker   Smokeless tobacco: Never Used  Substance Use Topics   Alcohol use: Yes    Alcohol/week: 3.0 standard drinks    Types: 3 Standard drinks or equivalent per week    Comment: social, 2 times per week   Drug use: No     Colonoscopy:  PAP:  Bone density:  Lipid panel:  Allergies  Allergen Reactions   Statins Other (See Comments)     Leg cramps in high doses   Bee Venom Hives    Current Outpatient Medications  Medication Sig Dispense Refill   letrozole (FEMARA) 2.5 MG  tablet TAKE 1 TABLET BY MOUTH EVERY DAY 90 tablet 3   levothyroxine (SYNTHROID) 88 MCG tablet TAKE 1 TABLET BY MOUTH EVERY DAY 90 tablet 0   naproxen sodium (ALEVE) 220 MG tablet Take 220 mg by mouth 2 (two) times daily as needed (pain).     primidone (MYSOLINE) 50 MG tablet Take 1 tablet (50 mg total) by mouth 2 (two) times daily. 180 tablet 1   propranolol (INDERAL) 40 MG tablet TAKE 1 TABLET BY MOUTH TWICE A DAY 180 tablet 1   rosuvastatin (CRESTOR) 5 MG tablet TAKE 1 TABLET BY MOUTH EVERY DAY 90 tablet 1   Vitamin D, Ergocalciferol, (DRISDOL) 1.25 MG (50000 UT) CAPS capsule TAKE 1 CAPSULE BY MOUTH ONE TIME PER WEEK 12 capsule 1   doxycycline (VIBRA-TABS) 100 MG tablet Take 1 tablet (100 mg total) by mouth 2 (two) times daily. (Patient not taking: Reported on 09/13/2018) 20 tablet 0   No current facility-administered medications for this visit.     OBJECTIVE: Vitals:   11/01/18 1310  BP: 114/73  Pulse: 65  Temp: (!) 96.6 F (35.9 C)     Body mass index is 25.32 kg/m.    ECOG FS:0 - Asymptomatic  General: Well-developed, well-nourished, no acute distress. Eyes: Pink conjunctiva, anicteric sclera. HEENT: Normocephalic, moist mucous membranes. Breast: Patient requested exam be deferred today. Lungs: Clear to auscultation bilaterally. Heart: Regular rate and rhythm. No rubs, murmurs, or gallops. Abdomen: Soft, nontender, nondistended. No organomegaly noted, normoactive bowel sounds. Musculoskeletal: No edema, cyanosis, or clubbing. Neuro: Alert, answering all questions appropriately. Cranial nerves grossly intact. Skin: No rashes or petechiae noted. Psych: Normal affect.  LAB RESULTS:  Lab Results  Component Value Date   NA 140 08/09/2018   K 5.4 (H) 08/09/2018   CL 104 08/09/2018   CO2 31 08/09/2018   GLUCOSE 101 (H) 08/09/2018   BUN 15 08/09/2018   CREATININE 0.69 08/09/2018   CALCIUM 9.5 08/09/2018   PROT 6.9 08/09/2018   ALBUMIN 4.2 08/09/2018   AST 14  08/09/2018   ALT 17 08/09/2018   ALKPHOS 90 08/09/2018   BILITOT 0.3 08/09/2018   GFRNONAA >60 10/03/2014   GFRAA >60 10/03/2014    Lab Results  Component Value Date   WBC 5.7 08/05/2016   NEUTROABS 3.8 08/05/2016   HGB 13.5 08/05/2016   HCT 40.8 08/05/2016   MCV 88.5 08/05/2016   PLT 335.0 08/05/2016     STUDIES: Dg Bone Density  Result Date: 11/01/2018 EXAM: DUAL X-RAY ABSORPTIOMETRY (DXA) FOR BONE MINERAL DENSITY IMPRESSION: Dear Dr Grayland Ormond, Your patient BRITTANEE GHAZARIAN completed a FRAX assessment on 11/01/2018 using the [place] (analysis version: 14.10) manufactured by [employer]. The following summarizes the results of our evaluation. PATIENT BIOGRAPHICAL: Name: Tanee, Henery Patient ID: [id number] Birth Date: [birth year] Height:    67.5 in. Gender:     Female  Age:        72.7       Weight:    162.9 lbs. Ethnicity:  White                            Exam Date: 11/01/2018 FRAX* RESULTS:  (version: 3.5) 10-year Probability of Fracture1 Major Osteoporotic Fracture2 Hip Fracture 12.1% 2.4% Population: [place] (Caucasian) Risk Factors: None Based on Femur (Right) Neck BMD 1 -The 10-year probability of fracture may be lower than reported if the patient has received treatment. 2 -Major Osteoporotic Fracture: Clinical Spine, Forearm, Hip or Shoulder *FRAX is a Materials engineer of the [employer] for Metabolic Bone Disease, a [place] (WHO) [facility]. ASSESSMENT: The probability of a major osteoporotic fracture is 12.1% within the next ten years. The probability of a hip fracture is 2.4% within the next ten years. . Technologist: SCE PATIENT BIOGRAPHICAL: Name: Ondria, Oswald Patient ID: [id number] Birth Date: 13-Apr-1946 Height: 67.5 in. Gender: Female Exam Date: 11/01/2018 Weight: 162.9 lbs. Indications: Advanced Age, Caucasian, Family Hx of Osteoporosis, Height Loss, High Risk Meds, History of Breast Cancer, Hypothyroid, Osteopenia,  Postmenopausal, Vitamin D Deficiency Fractures: Treatments: Calcium, Letrazole, Synthroid, Vit D ASSESSMENT: The BMD measured at Femur Neck Right is 0.765 g/cm2 with a T-score of -2.0. This patient is considered osteopenic according to [facility]) criteria. The quality of the scan is good. L4 was excluded due to degenerative changes. Site Region Measured Measured WHO Young Adult BMD Date       Age      Classification T-score AP Spine L1-L3 11/01/2018 70.7 Osteopenia -1.2 1.036 g/cm2 AP Spine L1-L3 10/26/2017 69.7 Osteopenia -1.1 1.041 g/cm2 AP Spine L1-L3 10/23/2016 68.7 Osteopenia -1.2 1.038 g/cm2 AP Spine L1-L3 10/23/2015 67.7 Normal -0.7 1.099 g/cm2 AP Spine L1-L3 10/31/2014 66.7 Normal -0.7 1.101 g/cm2 AP Spine L1-L3 08/31/2013 65.5 Normal -0.6 1.107 g/cm2 AP Spine L1-L3 02/27/2010 62.0 Normal -0.6 1.105 g/cm2 DualFemur Neck Right 11/01/2018 70.7 Osteopenia -2.0 0.765 g/cm2 DualFemur Neck Right 10/26/2017 69.7 Osteopenia -1.9 0.769 g/cm2 DualFemur Neck Right 10/23/2016 68.7 Osteopenia -1.9 0.774 g/cm2 DualFemur Neck Right 10/23/2015 67.7 Osteopenia -1.7 0.807 g/cm2 DualFemur Neck Right 10/31/2014 66.7 Osteopenia -1.7 0.805 g/cm2 DualFemur Neck Right 02/27/2010 62.0 Osteopenia -1.8 0.793 g/cm2 DualFemur Total Mean 11/01/2018 70.7 Osteopenia -1.3 0.846 g/cm2 DualFemur Total Mean 10/26/2017 69.7 Osteopenia -1.3 0.850 g/cm2 DualFemur Total Mean 10/23/2016 68.7 Osteopenia -1.1 0.863 g/cm2 DualFemur Total Mean 10/23/2015 67.7 Normal -1.0 0.886 g/cm2 DualFemur Total Mean 10/31/2014 66.7 Normal -1.0 0.887 g/cm2 DualFemur Total Mean 02/27/2010 62.0 Normal -1.0 0.882 g/cm2 World Health Organization [facility]) criteria for post-menopausal, Caucasian Women: Normal:       T-score at or above -1 SD Osteopenia:   T-score between -1 and -2.5 SD Osteoporosis: T-score at or below -2.5 SD RECOMMENDATIONS: 1. All patients should optimize calcium and vitamin D intake. 2. Consider FDA-approved medical therapies in  postmenopausal women and men aged 58 years and older, based on the following: a. A hip or vertebral(clinical or morphometric) fracture b. T-score < -2.5 at the femoral neck or spine after appropriate evaluation to exclude secondary causes c. Low bone mass (T-score between -1.0 and -2.5 at the femoral neck or spine) and a 10-year probability of a hip fracture > 3% or a 10-year probability of a major osteoporosis-related fracture > 20% based on the [place]-adapted WHO algorithm d. Clinician judgment and/or patient preferences may indicate treatment for people  with 10-year fracture probabilities above or below these levels FOLLOW-UP: People with diagnosed cases of osteoporosis or at high risk for fracture should have regular bone mineral density tests. For patients eligible for Medicare, routine testing is allowed once every 2 years. The testing frequency can be increased to one year for patients who have rapidly progressing disease, those who are receiving or discontinuing medical therapy to restore bone mass, or have additional risk factors. I have reviewed this report, and agree with the above findings. [facility] Radiology Electronically Signed   By: Franki Cabot M.D.   On: 11/01/2018 11:14   Mm 3d Screen Breast Uni Left  Result Date: 11/01/2018 CLINICAL DATA:  Screening. EXAM: DIGITAL SCREENING UNILATERAL LEFT MAMMOGRAM WITH CAD AND TOMO COMPARISON:  Previous exam(s). ACR Breast Density Category c: The breast tissue is heterogeneously dense, which may obscure small masses. FINDINGS: The patient has had a right mastectomy. There are no findings suspicious for malignancy. Images were processed with CAD. IMPRESSION: No mammographic evidence of malignancy. A result letter of this screening mammogram will be mailed directly to the patient. RECOMMENDATION: Screening mammogram in one year.  (Code:SM-L-1M) BI-RADS CATEGORY  1: Negative. 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  PLAN:    1. 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 Given the fact that her malignancy is lobular in nature, she did not require chemotherapy. Continue letrozole for 5 years completing in June 2021, although patient has expressed interest in extending treatment up to 7 to 10 years.  Patient's most recent mammogram earlier this morning on November 01, 2018 was with reported as BI-RADS 1, repeat in June 2021.  Return to clinic in 6 months for routine evaluation.   2. Osteopenia: Patient's bone mineral density from November 01, 2018 reported a T score of -2.0.  Which is essentially unchanged from 1 year prior where the T score was reported -1.9  Continue calcium and vitamin D supplementation.  Repeat in June 2021 along with mammogram as above. 3. Left breast abnormality: Patient had breast biopsy on August 21, 2015 that was negative for malignancy. Continue mammograms as above.    I spent a total of 20 minutes face-to-face with the patient of which greater than 50% of the visit was spent in counseling and coordination of care as detailed above.   Patient expressed understanding and was in agreement with this plan. She also understands that She can call clinic at any time with any questions, concerns, or complaints.    Lloyd Huger, MD   11/02/2018 6:41 AM

## 2018-11-01 ENCOUNTER — Ambulatory Visit
Admission: RE | Admit: 2018-11-01 | Discharge: 2018-11-01 | Disposition: A | Discharge status: Home / Self Care | Payer: Medicare Other | Source: Ambulatory Visit | Attending: Oncology | Admitting: Oncology

## 2018-11-01 ENCOUNTER — Other Ambulatory Visit: Payer: Self-pay

## 2018-11-01 ENCOUNTER — Ambulatory Visit: Payer: Medicare Other

## 2018-11-01 ENCOUNTER — Encounter: Payer: Self-pay | Admitting: Oncology

## 2018-11-01 ENCOUNTER — Inpatient Hospital Stay: Payer: Medicare Other | Attending: Oncology | Admitting: Oncology

## 2018-11-01 VITALS — BP 114/73 | HR 65 | Temp 96.6°F | Wt 164.1 lb

## 2018-11-01 DIAGNOSIS — M8580 Other specified disorders of bone density and structure, unspecified site: Secondary | ICD-10-CM | POA: Insufficient documentation

## 2018-11-01 DIAGNOSIS — M8589 Other specified disorders of bone density and structure, multiple sites: Secondary | ICD-10-CM | POA: Insufficient documentation

## 2018-11-01 DIAGNOSIS — Z9011 Acquired absence of right breast and nipple: Secondary | ICD-10-CM | POA: Insufficient documentation

## 2018-11-01 DIAGNOSIS — Z170 Estrogen receptor positive status [ER+]: Secondary | ICD-10-CM | POA: Insufficient documentation

## 2018-11-01 DIAGNOSIS — C50411 Malignant neoplasm of upper-outer quadrant of right female breast: Secondary | ICD-10-CM | POA: Insufficient documentation

## 2018-11-01 DIAGNOSIS — Z79811 Long term (current) use of aromatase inhibitors: Secondary | ICD-10-CM | POA: Insufficient documentation

## 2018-11-01 DIAGNOSIS — Z1231 Encounter for screening mammogram for malignant neoplasm of breast: Secondary | ICD-10-CM | POA: Diagnosis not present

## 2019-01-20 ENCOUNTER — Other Ambulatory Visit: Payer: Self-pay | Admitting: Internal Medicine

## 2019-01-23 ENCOUNTER — Other Ambulatory Visit: Payer: Self-pay | Admitting: Neurology

## 2019-01-28 ENCOUNTER — Other Ambulatory Visit: Payer: Self-pay | Admitting: Neurology

## 2019-01-28 NOTE — Telephone Encounter (Signed)
Requested Prescriptions   Pending Prescriptions Disp Refills  . primidone (MYSOLINE) 50 MG tablet [Pharmacy Med Name: PRIMIDONE 50 MG TABLET] 180 tablet 1    Sig: TAKE 1 TABLET BY MOUTH TWICE A DAY   Rx last filled: 05/18/18 #180 1 refills  Pt last seen: 09/13/18   Follow up appt scheduled:03/15/19

## 2019-02-08 ENCOUNTER — Other Ambulatory Visit: Payer: Self-pay | Admitting: Neurology

## 2019-02-08 ENCOUNTER — Other Ambulatory Visit: Payer: Self-pay | Admitting: Internal Medicine

## 2019-02-08 NOTE — Telephone Encounter (Signed)
Requested Prescriptions   Pending Prescriptions Disp Refills  . primidone (MYSOLINE) 50 MG tablet [Pharmacy Med Name: PRIMIDONE 50 MG TABLET] 60 tablet 0    Sig: TAKE 1 TABLET BY MOUTH TWICE A DAY   Rx last filled: 01/28/19 #60 0 REFILLS  Pt last seen:09/13/18  Follow up appt scheduled:03/15/19

## 2019-02-12 DIAGNOSIS — Z23 Encounter for immunization: Secondary | ICD-10-CM | POA: Diagnosis not present

## 2019-02-15 NOTE — Telephone Encounter (Signed)
Vaccines have been updated in the pt's chart.

## 2019-03-14 ENCOUNTER — Encounter: Payer: Self-pay | Admitting: Neurology

## 2019-03-14 NOTE — Progress Notes (Signed)
Tiffany Chang was seen today in follow up for Essential tremor.  Pt is currently on primidone, 50 mg twice per day and propranolol, 40 mg twice per day.  She emailed me in May stating that tremor had been worse, but her mother had recently passed away, so stress had been greatly increased.  We decided to hold off on changing any of her medication because of that.  Since that time, she states that she has better and worse days depending on the amount of anxiety that she has. pt denies falls.  Pt denies lightheadedness, near syncope.  No hallucinations.  Mood has been good - "I live in paradise."   ALLERGIES:   Allergies  Allergen Reactions  . Statins Other (See Comments)     Leg cramps in high doses  . Bee Venom Hives    CURRENT MEDICATIONS:  Outpatient Encounter Medications as of 03/15/2019  Medication Sig  . letrozole (FEMARA) 2.5 MG tablet TAKE 1 TABLET BY MOUTH EVERY DAY  . levothyroxine (SYNTHROID) 88 MCG tablet TAKE 1 TABLET BY MOUTH EVERY DAY  . naproxen sodium (ALEVE) 220 MG tablet Take 220 mg by mouth 2 (two) times daily as needed (pain).  . primidone (MYSOLINE) 50 MG tablet TAKE 1 TABLET BY MOUTH TWICE A DAY  . propranolol (INDERAL) 40 MG tablet TAKE 1 TABLET BY MOUTH TWICE A DAY  . rosuvastatin (CRESTOR) 5 MG tablet TAKE 1 TABLET BY MOUTH EVERY DAY  . Vitamin D, Ergocalciferol, (DRISDOL) 1.25 MG (50000 UT) CAPS capsule TAKE 1 CAPSULE BY MOUTH ONE TIME PER WEEK  . doxycycline (VIBRA-TABS) 100 MG tablet Take 1 tablet (100 mg total) by mouth 2 (two) times daily. (Patient not taking: Reported on 09/13/2018)   No facility-administered encounter medications on file as of 03/15/2019.     PAST MEDICAL HISTORY:   Past Medical History:  Diagnosis Date  . Breast cancer ([place]) 2016   [place] - RT MASTECTOMY  . Hashimoto's thyroiditis   . History of arthroscopy of left knee 1985  . Hyperlipidemia    mild, with prior statin intolerance  . Hypothyroid   . Osteoporosis    prior Evista  use x 5 yrs , 2007  . Spinal stenosis    mild  . Tremor   . Viral meningitis    73 years old    PAST SURGICAL HISTORY:   Past Surgical History:  Procedure Laterality Date  . CATARACT EXTRACTION W/PHACO Left 09/30/2017   Procedure: CATARACT EXTRACTION PHACO AND INTRAOCULAR LENS PLACEMENT (IOC);  Surgeon: Birder Robson, MD;  Location: [facility];  Service: Ophthalmology;  Laterality: Left;  [place] 00:40 AP% 15.7 CDE 6.38 Fluid pack lot # [id number]  . CESAREAN SECTION    . CHOLECYSTECTOMY  2006  . FOOT SURGERY     plantar fascitis, Dr. Elvina Mattes  . KNEE ARTHROSCOPY  1985   right  . MASTECTOMY Right 2016   [facility]  . SENTINEL NODE BIOPSY Right 10/10/2014   Procedure: SENTINEL NODE BIOPSY;  Surgeon: Leonie Green, MD;  Location: [facility];  Service: General;  Laterality: Right;  . SIMPLE MASTECTOMY WITH AXILLARY SENTINEL NODE BIOPSY Right 10/10/2014   Procedure: SIMPLE MASTECTOMY;  Surgeon: Leonie Green, MD;  Location: [facility];  Service: General;  Laterality: Right;  . TONSILLECTOMY      SOCIAL HISTORY:   Social History   Socioeconomic History  . Marital status: Married    Spouse name: Not on file  . Number of children: Not  on file  . Years of education: Not on file  . Highest education level: Not on file  Occupational History    Employer: [employer]    Comment: exercise physiologist  Social Needs  . Financial resource strain: Not on file  . Food insecurity    Worry: Never true    Inability: Never true  . Transportation needs    Medical: No    Non-medical: No  Tobacco Use  . Smoking status: Never Smoker  . Smokeless tobacco: Never Used  Substance and Sexual Activity  . Alcohol use: Yes    Alcohol/week: 3.0 standard drinks    Types: 3 Standard drinks or equivalent per week    Comment: social, 2 times per week  . Drug use: No  . Sexual activity: Not Currently  Lifestyle  . Physical activity    Days per week: Not on file    Minutes  per session: Not on file  . Stress: Not on file  Relationships  . Social Herbalist on phone: Not on file    Gets together: Not on file    Attends religious service: Not on file    Active member of club or organization: Not on file    Attends meetings of clubs or organizations: Not on file    Relationship status: Not on file  . Intimate partner violence    Fear of current or ex partner: No    Emotionally abused: No    Physically abused: No    Forced sexual activity: No  Other Topics Concern  . Not on file  Social History Narrative   Right handed   2 grown children   3 story home    FAMILY HISTORY:   Family Status  Relation Name Status  . Mother  Deceased       htn, has pacemaker  . Father  Deceased at age 42       AAA  . Sister  Alive       breast cancer  . Sister  Alive  . Sister  Alive  . Son  Alive       melanoma  . Daughter  Alive  . Sister  (Not Specified)  . MGF  (Not Specified)  . MGM  (Not Specified)    ROS:  Review of Systems  Constitutional: Negative.   HENT: Negative.   Eyes: Negative.   Cardiovascular: Negative.   Gastrointestinal: Negative.   Genitourinary: Negative.   Skin: Negative.   Neurological: Positive for tremors.  Endo/Heme/Allergies: Negative.      PHYSICAL EXAMINATION:    VITALS:   Vitals:   03/15/19 1431  BP: 118/64  Pulse: 74  SpO2: 95%  Weight: 163 lb (73.9 kg)  Height: 5\' 8"  (1.727 m)    GEN:  The patient appears stated age and is in NAD. HEENT:  Normocephalic, atraumatic.  The mucous membranes are moist. The superficial temporal arteries are without ropiness or tenderness. CV:  RRR Lungs:  CTAB Neck/HEME:  There are no carotid bruits bilaterally.  Neurological examination:  Orientation: The patient is alert and oriented x3. Cranial nerves: There is good facial symmetry. The speech is fluent and clear. Soft palate rises symmetrically and there is no tongue deviation. Hearing is intact to conversational  tone. Sensation: Sensation is intact to light touch throughout Motor: Strength is at least antigravity x4.  Movement examination: Tone: There is normal tone in the UE/LE Abnormal movements: there is  moderate to severe postural tremor on the right.  It is mild on the left.  She has significant difficulty pouring water from 1 glass to another when the glasses in the right hand. Coordination:  There is no decremation with RAM's. Gait and Station: The patient has no difficulty with ambulation.  Her walk is steady.   ASSESSMENT/PLAN:  1.  Essential tremor, overall moderate to severe in the right hand.  -Continue propranolol, 40 mg twice per day  -increase primidone, 50 mg, 2 po q AM, 1 in the afternoon.    -I do think she could be a DBS candidate.  I talked to the patient about the logistics associated with DBS therapy.  I talked to the patient about risks/benefits/side effects of DBS therapy.  We talked about risks which included but were not limited to infection, paralysis, intraoperative seizure, death, stroke, bleeding around the electrode.   I talked to patient about fiducial placement 1 week prior to DBS therapy.  I talked to the patient about what to expect in the operating room, including the fact that this is an awake surgery.  We talked about battery placement as well as which is done under general anesthesia, generally approximately one week following the initial surgery.  We also talked about the fact that the patient will need to be off of medications for surgery.  The patient and family were given the opportunity to ask questions, which they did, and I answered them to the best of my ability today.  We discussed focused u/s.  She really is not interested in these therapies right now.  2.  Follow up is anticipated in the next 4-6 months, sooner should new neurologic issues arise.  Much greater than 50% of this visit was spent in counseling and coordinating care.  Total face to face time:   30 min  Cc:  Crecencio Mc, MD

## 2019-03-15 ENCOUNTER — Encounter: Payer: Self-pay | Admitting: Neurology

## 2019-03-15 ENCOUNTER — Ambulatory Visit (INDEPENDENT_AMBULATORY_CARE_PROVIDER_SITE_OTHER): Payer: Medicare Other | Admitting: Neurology

## 2019-03-15 ENCOUNTER — Other Ambulatory Visit: Payer: Self-pay

## 2019-03-15 VITALS — BP 118/64 | HR 74 | Ht 68.0 in | Wt 163.0 lb

## 2019-03-15 DIAGNOSIS — G250 Essential tremor: Secondary | ICD-10-CM

## 2019-03-15 NOTE — Patient Instructions (Signed)
Increase primidone 50 mg, 2 tablets in the AM, 1 in the evening.

## 2019-03-16 ENCOUNTER — Encounter: Payer: Self-pay | Admitting: Oncology

## 2019-03-16 ENCOUNTER — Ambulatory Visit (INDEPENDENT_AMBULATORY_CARE_PROVIDER_SITE_OTHER): Payer: Medicare Other

## 2019-03-16 DIAGNOSIS — Z0000 Encounter for general adult medical examination without abnormal findings: Secondary | ICD-10-CM | POA: Diagnosis not present

## 2019-03-16 NOTE — Patient Instructions (Addendum)
  Tiffany Chang , Thank you for taking time to come for your Medicare Wellness Visit. I appreciate your ongoing commitment to your health goals. Please review the following plan we discussed and let me know if I can assist you in the future.   These are the goals we discussed: Goals    . Follow up with Primary Care Provider     As needed       This is a list of the screening recommended for you and due dates:  Health Maintenance  Topic Date Due  . Mammogram  10/31/2020  . Tetanus Vaccine  06/29/2022  . Colon Cancer Screening  07/27/2022  . Flu Shot  Completed  . DEXA scan (bone density measurement)  Completed  .  Hepatitis C: One time screening is recommended by Center for Disease Control  (CDC) for  adults born from [birth year] through 1963.   Completed  . Pneumonia vaccines  Completed

## 2019-03-16 NOTE — Progress Notes (Signed)
Subjective:   Tiffany Chang is a 73 y.o. female who presents for Medicare Annual (Subsequent) preventive examination.  Review of Systems:  No ROS.  Medicare Wellness Virtual Visit.  Visual/audio telehealth visit, UTA vital signs.   See social history for additional risk factors.   Cardiac Risk Factors include: advanced age (>65men, >68 women)     Objective:     Vitals: There were no vitals taken for this visit.  There is no height or weight on file to calculate BMI.  Advanced Directives 03/16/2019 03/15/2019 11/01/2018 04/26/2018 10/26/2017 09/30/2017 08/05/2017  Does Patient Have a Medical Advance Directive? Yes Yes Yes Yes Yes Yes Yes  Type of Paramedic of [place];Living will - Living will;Healthcare Power of [place];Living will [place];Living will Healthcare Power of [place];Living will  Does patient want to make changes to medical advance directive? No - Patient declined - No - Patient declined No - Patient declined No - Patient declined No - Patient declined No - Patient declined  Copy of [place] in Chart? No - copy requested - No - copy requested No - copy requested No - copy requested No - copy requested No - copy requested    Tobacco Social History   Tobacco Use  Smoking Status Never Smoker  Smokeless Tobacco Never Used     Counseling given: Not Answered   Clinical Intake:  Pre-visit preparation completed: Yes        Diabetes: No  How often do you need to have someone help you when you read instructions, pamphlets, or other written materials from your doctor or pharmacy?: 1 - Never  Interpreter Needed?: No     Past Medical History:  Diagnosis Date   Breast cancer ([place]) 2016   [place] - RT MASTECTOMY   Hashimoto's thyroiditis    History of arthroscopy of left knee 1985   Hyperlipidemia    mild, with prior statin intolerance    Hypothyroid    Osteoporosis    prior Evista use x 5 yrs , 2007   Spinal stenosis    mild   Tremor    Viral meningitis    73 years old   Past Surgical History:  Procedure Laterality Date   CATARACT EXTRACTION W/PHACO Left 09/30/2017   Procedure: CATARACT EXTRACTION PHACO AND INTRAOCULAR LENS PLACEMENT ([place]);  Surgeon: Birder Robson, MD;  Location: [facility];  Service: Ophthalmology;  Laterality: Left;  [place] 00:40 AP% 15.7 CDE 6.38 Fluid pack lot # [id number]   CESAREAN SECTION     CHOLECYSTECTOMY  2006   FOOT SURGERY     plantar fascitis, Dr. Elvina Mattes   KNEE ARTHROSCOPY  1985   right   MASTECTOMY Right 2016   [place]   SENTINEL NODE BIOPSY Right 10/10/2014   Procedure: SENTINEL NODE BIOPSY;  Surgeon: Leonie Green, MD;  Location: [facility];  Service: General;  Laterality: Right;   SIMPLE MASTECTOMY WITH AXILLARY SENTINEL NODE BIOPSY Right 10/10/2014   Procedure: SIMPLE MASTECTOMY;  Surgeon: Leonie Green, MD;  Location: [facility];  Service: General;  Laterality: Right;   TONSILLECTOMY     Family History  Problem Relation Age of Onset   Osteoporosis Mother    Aortic aneurysm Father    Cancer Sister 48       Breast   Breast cancer Sister 93   Cancer Maternal Grandfather        breast   Breast  cancer Maternal Grandmother 86   Social History   Socioeconomic History   Marital status: Married    Spouse name: Not on file   Number of children: Not on file   Years of education: Not on file   Highest education level: Not on file  Occupational History    Employer: [employer]    Comment: exercise physiologist  Social Needs   Financial resource strain: Not hard at all   Food insecurity    Worry: Never true    Inability: Never true   Transportation needs    Medical: No    Non-medical: No  Tobacco Use   Smoking status: Never Smoker   Smokeless tobacco: Never Used  Substance and Sexual Activity   Alcohol use: Yes      Alcohol/week: 3.0 standard drinks    Types: 3 Standard drinks or equivalent per week    Comment: social, 2 times per week   Drug use: No   Sexual activity: Not Currently  Lifestyle   Physical activity    Days per week: 5 days    Minutes per session: 50 min   Stress: Not at all  Relationships   Social connections    Talks on phone: Not on file    Gets together: Not on file    Attends religious service: Not on file    Active member of club or organization: Not on file    Attends meetings of clubs or organizations: Not on file    Relationship status: Not on file  Other Topics Concern   Not on file  Social History Narrative   Right handed   2 grown children   3 story home    Outpatient Encounter Medications as of 03/16/2019  Medication Sig   letrozole ([place]) 2.5 MG tablet TAKE 1 TABLET BY MOUTH EVERY DAY   levothyroxine (SYNTHROID) 88 MCG tablet TAKE 1 TABLET BY MOUTH EVERY DAY   naproxen sodium (ALEVE) 220 MG tablet Take 220 mg by mouth 2 (two) times daily as needed (pain).   primidone (MYSOLINE) 50 MG tablet TAKE 1 TABLET BY MOUTH TWICE A DAY (Patient taking differently: 2 in the AM, 1 at night)   propranolol (INDERAL) 40 MG tablet TAKE 1 TABLET BY MOUTH TWICE A DAY   rosuvastatin (CRESTOR) 5 MG tablet TAKE 1 TABLET BY MOUTH EVERY DAY   Vitamin D, Ergocalciferol, (DRISDOL) 1.25 MG (50000 UT) CAPS capsule TAKE 1 CAPSULE BY MOUTH ONE TIME PER WEEK   doxycycline (VIBRA-TABS) 100 MG tablet Take 1 tablet (100 mg total) by mouth 2 (two) times daily. (Patient not taking: Reported on 03/16/2019)   No facility-administered encounter medications on file as of 03/16/2019.     Activities of Daily Living In your present state of health, do you have any difficulty performing the following activities: 03/16/2019  Hearing? N  Vision? N  Difficulty concentrating or making decisions? N  Walking or climbing stairs? N  Dressing or bathing? N  Doing errands, shopping? N   Preparing Food and eating ? N  Using the Toilet? N  In the past six months, have you accidently leaked urine? N  Do you have problems with loss of bowel control? N  Managing your Medications? N  Managing your Finances? N  Housekeeping or managing your Housekeeping? N  Some recent data might be hidden    Patient Care Team: Crecencio Mc, MD as PCP - General (Internal Medicine)    Assessment:  This is a routine wellness examination for [place].  Nurse connected with patient 03/16/19 at  1:00 PM EDT by a telephone enabled telemedicine application and verified that I am speaking with the correct person using two identifiers. Patient stated full name and DOB. Patient gave permission to continue with virtual visit. Patient's location was at home and Nurse's location was at [place] office.   Health Maintenance Due: See completed HM at the end of note.   Eye: Visual acuity not assessed. Virtual visit. Wears corrective lenses. Followed by their ophthalmologist every 12 months. L eye cataract extracted. Plans to schedule R eye cataract extraction in December.   Dental: Visits every 6 months.    Hearing: Demonstrates normal hearing during visit.  Safety:  Patient feels safe at home- yes Patient does have smoke detectors at home- yes Patient does wear sunscreen or protective clothing when in direct sunlight - yes Patient does wear seat belt when in a moving vehicle - yes Patient drives- yes Adequate lighting in walkways free from debris- yes Grab bars and handrails used as appropriate- yes Ambulates with no assistive device Cell phone on person when ambulating outside of the home- yes  Social: Alcohol intake - yes      Smoking history- never   Smokers in home? none Illicit drug use? none  Depression: PHQ 2 &9 complete. See screening below. Denies irritability, anhedonia, sadness/tearfullness.    Falls: See screening below.    Medication: Taking as directed and without  issues.   Covid-19: Precautions and sickness symptoms discussed. Wears mask, social distancing, hand hygiene as appropriate.   Activities of Daily Living Patient denies needing assistance with: household chores, feeding themselves, getting from bed to chair, getting to the toilet, bathing/showering, dressing, managing money, or preparing meals.   Memory: Patient is alert. Patient denies difficulty focusing or concentrating. Correctly identified the president of the [place], season and recall.  Patient likes to read, play computer games, complete puzzles for brain stimulation.   BMI- discussed the importance of a healthy diet, water intake and the benefits of aerobic exercise.  Educational material provided.  Physical activity- walking, strength training, stretching, gardening,   Diet:  Regular Water: good intake  Other Providers Patient Care Team: Crecencio Mc, MD as PCP - General (Internal Medicine)  Exercise Activities and Dietary recommendations Current Exercise Habits: Home exercise routine, Type of exercise: walking;stretching;calisthenics;strength training/weights, Intensity: Moderate  Goals     Follow up with Primary Care Provider     As needed       Fall Risk Fall Risk  03/16/2019 03/15/2019 09/13/2018 04/01/2018 08/05/2017  Falls in the past year? 0 0 0 0 No  Number falls in past yr: - 0 0 0 -  Injury with Fall? - 0 0 0 -  Risk for fall due to : - - - History of fall(s) -  Follow up - - - Falls evaluation completed -   Timed Get Up and Go performed: no, virtual visit  Depression Screen PHQ 2/9 Scores 03/16/2019 08/05/2017 08/04/2016 07/25/2016  PHQ - 2 Score 0 0 0 0  PHQ- 9 Score - 0 - -     Cognitive Function MMSE - Mini Mental State Exam 08/05/2017  Orientation to time 5  Orientation to Place 5  Registration 3  Attention/ Calculation 5  Recall 3  Language- name 2 objects 2  Language- repeat 1  Language- follow 3 step command 3  Language- read & follow  direction 1  Write a  sentence 0  Write a sentence-comments Did not ask to complete; tremor  Copy design 0  Copy design-comments Did not ask to complete; tremor  Total score 28     6CIT Screen 08/04/2016  What Year? 0 points  What month? 0 points  What time? 0 points  Count back from 20 0 points  Months in reverse 0 points  Repeat phrase 0 points  Total Score 0    Immunization History  Administered Date(s) Administered   Influenza Inj Mdck Quad With Preservative 03/02/2018   Influenza Split 02/17/2012, 02/16/2013   Influenza, High Dose Seasonal PF 02/12/2019, 02/16/2019   Influenza-Unspecified 02/16/2014, 02/17/2015, 03/06/2016, 02/13/2017, 03/02/2018   Pneumococcal Conjugate-13 07/11/2013   Pneumococcal Polysaccharide-23 08/04/2016   Tdap 06/29/2012   Zoster 06/25/2013   Zoster Recombinat (Shingrix) 01/22/2018, 01/25/2019   Screening Tests Health Maintenance  Topic Date Due   MAMMOGRAM  10/31/2020   TETANUS/TDAP  06/29/2022   COLONOSCOPY  07/27/2022   INFLUENZA VACCINE  Completed   DEXA SCAN  Completed   Hepatitis C Screening  Completed   PNA vac Low Risk Adult  Completed      Plan:   Keep all routine maintenance appointments.   Cpe 08/10/19  Medicare Attestation I have personally reviewed: The patient's medical and social history Their use of alcohol, tobacco or illicit drugs Their current medications and supplements The patient's functional ability including ADLs,fall risks, home safety risks, cognitive, and hearing and visual impairment Diet and physical activities Evidence for depression   In addition, I have reviewed and discussed with patient certain preventive protocols, quality metrics, and best practice recommendations. A written personalized care plan for preventive services as well as general preventive health recommendations were provided to patient via mail.     Varney Biles, LPN  [id number]

## 2019-03-24 ENCOUNTER — Other Ambulatory Visit: Payer: Self-pay | Admitting: Neurology

## 2019-04-11 MED ORDER — PRIMIDONE 50 MG PO TABS: 100.0000 mg | ORAL_TABLET | Freq: Two times a day (BID) | ORAL | 1 refills | Status: DC

## 2019-04-17 ENCOUNTER — Other Ambulatory Visit: Payer: Self-pay | Admitting: Neurology

## 2019-04-27 DIAGNOSIS — H2511 Age-related nuclear cataract, right eye: Secondary | ICD-10-CM | POA: Diagnosis not present

## 2019-05-01 NOTE — Progress Notes (Signed)
[place]  [phone] [phone]  ID: Donn Wilmot OB: 11-Nov-1945  MR#: [id number]  CWC#:[id number]  Patient Care Team: Crecencio Mc, MD as PCP - General (Internal Medicine)  I connected with Liliane Channel on 05/04/19 at  2:30 PM EST by video enabled telemedicine visit and verified that I am speaking with the correct person using two identifiers.   I discussed the limitations, risks, security and privacy concerns of performing an evaluation and management service by telemedicine and the availability of in-person appointments. I also discussed with the patient that there may be a patient responsible charge related to this service. The patient expressed understanding and agreed to proceed.   Other persons participating in the visit and their role in the encounter: Patient, MD.  Patient's location: Home. Provider's location: Clinic.  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  INTERVAL HISTORY: Patient agreed to video enabled telemedicine visit for routine 11-monthevaluation.  She continues to tolerate letrozole well without significant side effects.  She currently feels well and is asymptomatic. She has no neurologic complaints.  She denies any recent fevers or illnesses.  She has a good appetite and denies weight loss.  She denies any chest pain, shortness of breath, cough, or hemoptysis.  She denies any nausea, vomiting, constipation, or diarrhea. She has no urinary complaints.  Patient offers no specific complaints today.  REVIEW OF SYSTEMS:   Review of Systems  Constitutional: Negative.  Negative for fever, malaise/fatigue and weight loss.  Respiratory: Negative.  Negative for cough and shortness of breath.   Cardiovascular: Negative.  Negative for chest pain and leg swelling.  Gastrointestinal: Negative.  Negative for abdominal pain.  Genitourinary:  Negative.  Negative for dysuria.  Musculoskeletal: Negative.  Negative for back pain.  Skin: Negative.  Negative for rash.  Neurological: Negative.  Negative for sensory change, focal weakness, weakness and headaches.  Psychiatric/Behavioral: Negative.  The patient is not nervous/anxious.     As per HPI. Otherwise, a complete review of systems is negative.  PAST MEDICAL HISTORY: Past Medical History:  Diagnosis Date  . Breast cancer ([place]   [place] MAST[place]  . Hashimoto's thyroiditis   . History of arthroscopy of left knee 1985  . Hyperlipidemia    mild, with prior statin intolerance  . Hypothyroid   . Osteoporosis    prior Evista use x 5 yrs , 2007  . Spinal stenosis    mild  . Tremor   . Viral meningitis    73years old    PAST SURGICAL HISTORY: Past Surgical History:  Procedure Laterality Date  . CATARACT EXTRACTION W/PHACO Left 09/30/2017   Procedure: CATARACT EXTRACTION PHACO AND INTRAOCULAR LENS PLACEMENT (IOC);  Surgeon: PBirder Robson MD;  Location: [facility] ORS;  Service: Ophthalmology;  Laterality: Left;  [place]40 AP% 15.7 CDE 6.38 Fluid pack lot # [id number]  . CESAREAN SECTION    . CHOLECYSTECTOMY  2006  . FOOT SURGERY     plantar fascitis, Dr. TElvina Mattes . KNEE ARTHROSCOPY  1985   right  . MASTECTOMY Right 2016   [facility]  . SENTINEL NODE BIOPSY Right 10/10/2014   Procedure: SENTINEL NODE BIOPSY;  Surgeon: JLeonie Green MD;  Location: [facility] ORS;  Service: General;  Laterality: Right;  . SIMPLE MASTECTOMY WITH AXILLARY SENTINEL NODE BIOPSY Right 10/10/2014   Procedure: SIMPLE MASTECTOMY;  Surgeon: JLeonie Green MD;  Location: [facility] ORS;  Service: General;  Laterality: Right;  . TONSILLECTOMY      FAMILY HISTORY Family History  Problem Relation Age of Onset  . Osteoporosis Mother   . Aortic aneurysm Father   . Cancer Sister 66       Breast  . Breast cancer Sister 2  . Cancer Maternal Grandfather        breast  . Breast cancer Maternal  Grandmother 86       ADVANCED DIRECTIVES:    HEALTH MAINTENANCE: Social History   Tobacco Use  . Smoking status: Never Smoker  . Smokeless tobacco: Never Used  Substance Use Topics  . Alcohol use: Yes    Alcohol/week: 3.0 standard drinks    Types: 3 Standard drinks or equivalent per week    Comment: social, 2 times per week  . Drug use: No     Colonoscopy:  PAP:  Bone density:  Lipid panel:  Allergies  Allergen Reactions  . Statins Other (See Comments)     Leg cramps in high doses  . Bee Venom Hives    Current Outpatient Medications  Medication Sig Dispense Refill  . letrozole (FEMARA) 2.5 MG tablet TAKE 1 TABLET BY MOUTH EVERY DAY 90 tablet 3  . levothyroxine (SYNTHROID) 88 MCG tablet TAKE 1 TABLET BY MOUTH EVERY DAY 90 tablet 0  . naproxen sodium (ALEVE) 220 MG tablet Take 220 mg by mouth 2 (two) times daily as needed (pain).    . primidone (MYSOLINE) 50 MG tablet Take 2 tablets (100 mg total) by mouth 2 (two) times daily. 360 tablet 1  . propranolol (INDERAL) 40 MG tablet TAKE 1 TABLET BY MOUTH TWICE A DAY 180 tablet 1  . rosuvastatin (CRESTOR) 5 MG tablet TAKE 1 TABLET BY MOUTH EVERY DAY 90 tablet 1  . Vitamin D, Ergocalciferol, (DRISDOL) 1.25 MG (50000 UT) CAPS capsule TAKE 1 CAPSULE BY MOUTH ONE TIME PER WEEK 12 capsule 1   No current facility-administered medications for this visit.    OBJECTIVE: There were no vitals filed for this visit.   There is no height or weight on file to calculate BMI.    ECOG FS:0 - Asymptomatic  General: Well-developed, well-nourished, no acute distress. HEENT: Normocephalic. Neuro: Alert, answering all questions appropriately. Cranial nerves grossly intact. Psych: Normal affect.   LAB RESULTS:  Lab Results  Component Value Date   NA 140 08/09/2018   K 5.4 (H) 08/09/2018   CL 104 08/09/2018   CO2 31 08/09/2018   GLUCOSE 101 (H) 08/09/2018   BUN 15 08/09/2018   CREATININE 0.69 08/09/2018   CALCIUM 9.5 08/09/2018    PROT 6.9 08/09/2018   ALBUMIN 4.2 08/09/2018   AST 14 08/09/2018   ALT 17 08/09/2018   ALKPHOS 90 08/09/2018   BILITOT 0.3 08/09/2018   GFRNONAA >60 10/03/2014   GFRAA >60 10/03/2014    Lab Results  Component Value Date   WBC 5.7 08/05/2016   NEUTROABS 3.8 08/05/2016   HGB 13.5 08/05/2016   HCT 40.8 08/05/2016   MCV 88.5 08/05/2016   PLT 335.0 08/05/2016     STUDIES: No results found.  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  PLAN:    1. 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 Given the  fact that her malignancy is lobular in nature, she did not require chemotherapy.  Continue letrozole for a total of 5 years completing in June 2021.  Patient has expressed interest in extending treatment for 7 to 10 years.  Patient's most recent mammogram on November 01, 2018 was with reported as BI-RADS 1, repeat in June 2021.  Return to clinic in 6 months for routine evaluation. 2. Osteopenia: Patient's bone mineral density from November 01, 2018 reported a T score of -2.0.  Which is essentially unchanged from 1 year prior where the T score was reported -1.9  Continue calcium and vitamin D supplementation.  Repeat in June 2021 along with mammogram as above. 3. Left breast abnormality: Patient had breast biopsy on August 21, 2015 that was negative for malignancy. Continue mammograms as above.    I provided 15 minutes of face-to-face video visit time during this encounter which included chart review. Greater than 50% was spent counseling as documented under my assessment & plan.   Patient expressed understanding and was in agreement with this plan. She also understands that She can call clinic at any time with any questions, concerns, or  complaints.    Lloyd Huger, MD   05/04/2019 6:47 AM

## 2019-05-02 ENCOUNTER — Encounter: Payer: Self-pay | Admitting: Oncology

## 2019-05-02 ENCOUNTER — Other Ambulatory Visit: Payer: Self-pay

## 2019-05-02 DIAGNOSIS — M169 Osteoarthritis of hip, unspecified: Secondary | ICD-10-CM | POA: Insufficient documentation

## 2019-05-02 DIAGNOSIS — C801 Malignant (primary) neoplasm, unspecified: Secondary | ICD-10-CM | POA: Insufficient documentation

## 2019-05-02 DIAGNOSIS — M1990 Unspecified osteoarthritis, unspecified site: Secondary | ICD-10-CM | POA: Insufficient documentation

## 2019-05-02 DIAGNOSIS — R251 Tremor, unspecified: Secondary | ICD-10-CM | POA: Insufficient documentation

## 2019-05-02 NOTE — Progress Notes (Signed)
Patient prescreened for appointment. Patient has no concerns or questions.  

## 2019-05-03 ENCOUNTER — Other Ambulatory Visit: Payer: Self-pay | Admitting: Internal Medicine

## 2019-05-03 ENCOUNTER — Inpatient Hospital Stay: Payer: Medicare Other | Attending: Oncology | Admitting: Oncology

## 2019-05-03 DIAGNOSIS — C50411 Malignant neoplasm of upper-outer quadrant of right female breast: Secondary | ICD-10-CM | POA: Diagnosis not present

## 2019-05-03 DIAGNOSIS — Z170 Estrogen receptor positive status [ER+]: Secondary | ICD-10-CM | POA: Diagnosis not present

## 2019-05-05 ENCOUNTER — Other Ambulatory Visit: Payer: Self-pay | Admitting: Internal Medicine

## 2019-05-05 ENCOUNTER — Other Ambulatory Visit: Payer: Self-pay | Admitting: Oncology

## 2019-05-25 DIAGNOSIS — H2511 Age-related nuclear cataract, right eye: Secondary | ICD-10-CM | POA: Diagnosis not present

## 2019-05-25 DIAGNOSIS — E7800 Pure hypercholesterolemia, unspecified: Secondary | ICD-10-CM | POA: Diagnosis not present

## 2019-05-26 ENCOUNTER — Other Ambulatory Visit: Payer: Self-pay

## 2019-05-26 ENCOUNTER — Encounter: Payer: Self-pay | Admitting: Ophthalmology

## 2019-05-27 NOTE — Anesthesia Preprocedure Evaluation (Addendum)
Anesthesia Evaluation  Patient identified by MRN, date of birth, ID band Patient awake    Reviewed: Allergy & Precautions, NPO status , Patient's Chart, lab work & pertinent test results  History of Anesthesia Complications Negative for: history of anesthetic complications  Airway Mallampati: II  TM Distance: >3 FB Neck ROM: Limited    Dental   Pulmonary    breath sounds clear to auscultation       Cardiovascular (-) angina(-) DOE  Rhythm:Regular Rate:Normal   HLD   Neuro/Psych  Benign essential tremor  Neuromuscular disease (Spinal stenosis)    GI/Hepatic neg GERD  ,  Endo/Other  diabetes (Pre-DM)Hypothyroidism   Renal/GU      Musculoskeletal  (+) Arthritis ,   Abdominal   Peds  Hematology   Anesthesia Other Findings R breast cancer  Reproductive/Obstetrics                            Anesthesia Physical Anesthesia Plan  ASA: II  Anesthesia Plan: MAC   Post-op Pain Management:    Induction: Intravenous  PONV Risk Score and Plan: 2 and TIVA, Midazolam and Treatment may vary due to age or medical condition  Airway Management Planned: Nasal Cannula  Additional Equipment:   Intra-op Plan:   Post-operative Plan:   Informed Consent: I have reviewed the patients History and Physical, chart, labs and discussed the procedure including the risks, benefits and alternatives for the proposed anesthesia with the patient or authorized representative who has indicated his/her understanding and acceptance.       Plan Discussed with: CRNA and Anesthesiologist  Anesthesia Plan Comments:         Anesthesia Quick Evaluation

## 2019-06-03 ENCOUNTER — Other Ambulatory Visit: Payer: Self-pay

## 2019-06-03 ENCOUNTER — Other Ambulatory Visit
Admission: RE | Admit: 2019-06-03 | Discharge: 2019-06-03 | Disposition: A | Discharge status: Home / Self Care | Payer: Medicare Other | Source: Ambulatory Visit | Attending: Ophthalmology | Admitting: Ophthalmology

## 2019-06-03 DIAGNOSIS — Z20822 Contact with and (suspected) exposure to covid-19: Secondary | ICD-10-CM | POA: Diagnosis not present

## 2019-06-03 DIAGNOSIS — Z01812 Encounter for preprocedural laboratory examination: Secondary | ICD-10-CM | POA: Diagnosis not present

## 2019-06-04 LAB — SARS CORONAVIRUS 2 (TAT 6-24 HRS): SARS Coronavirus 2: NEGATIVE

## 2019-06-06 NOTE — Discharge Instructions (Signed)
General Anesthesia, Adult, Care After This sheet gives you information about how to care for yourself after your procedure. Your health care provider may also give you more specific instructions. If you have problems or questions, contact your health care provider. What can I expect after the procedure? After the procedure, the following side effects are common:  Pain or discomfort at the IV site.  Nausea.  Vomiting.  Sore throat.  Trouble concentrating.  Feeling cold or chills.  Weak or tired.  Sleepiness and fatigue.  Soreness and body aches. These side effects can affect parts of the body that were not involved in surgery. Follow these instructions at home:  For at least 24 hours after the procedure:  Have a responsible adult stay with you. It is important to have someone help care for you until you are awake and alert.  Rest as needed.  Do not: ? Participate in activities in which you could fall or become injured. ? Drive. ? Use heavy machinery. ? Drink alcohol. ? Take sleeping pills or medicines that cause drowsiness. ? Make important decisions or sign legal documents. ? Take care of children on your own. Eating and drinking  Follow any instructions from your health care provider about eating or drinking restrictions.  When you feel hungry, start by eating small amounts of foods that are soft and easy to digest (bland), such as toast. Gradually return to your regular diet.  Drink enough fluid to keep your urine pale yellow.  If you vomit, rehydrate by drinking water, juice, or clear broth. General instructions  If you have sleep apnea, surgery and certain medicines can increase your risk for breathing problems. Follow instructions from your health care provider about wearing your sleep device: ? Anytime you are sleeping, including during daytime naps. ? While taking prescription pain medicines, sleeping medicines, or medicines that make you drowsy.  Return to  your normal activities as told by your health care provider. Ask your health care provider what activities are safe for you.  Take over-the-counter and prescription medicines only as told by your health care provider.  If you smoke, do not smoke without supervision.  Keep all follow-up visits as told by your health care provider. This is important. Contact a health care provider if:  You have nausea or vomiting that does not get better with medicine.  You cannot eat or drink without vomiting.  You have pain that does not get better with medicine.  You are unable to pass urine.  You develop a skin rash.  You have a fever.  You have redness around your IV site that gets worse. Get help right away if:  You have difficulty breathing.  You have chest pain.  You have blood in your urine or stool, or you vomit blood. Summary  After the procedure, it is common to have a sore throat or nausea. It is also common to feel tired.  Have a responsible adult stay with you for the first 24 hours after general anesthesia. It is important to have someone help care for you until you are awake and alert.  When you feel hungry, start by eating small amounts of foods that are soft and easy to digest (bland), such as toast. Gradually return to your regular diet.  Drink enough fluid to keep your urine pale yellow.  Return to your normal activities as told by your health care provider. Ask your health care provider what activities are safe for you. This information is not   intended to replace advice given to you by your health care provider. Make sure you discuss any questions you have with your health care provider. Document Revised: 05/08/2017 Document Reviewed: 12/19/2016 Elsevier Patient Education  2020 Elsevier Inc.  Cataract Surgery, Care After This sheet gives you information about how to care for yourself after your procedure. Your health care provider may also give you more specific  instructions. If you have problems or questions, contact your health care provider. What can I expect after the procedure? After the procedure, it is common to have:  Itching.  Discomfort.  Fluid discharge.  Sensitivity to light and to touch.  Bruising in or around the eye.  Mild blurred vision. Follow these instructions at home: Eye care   Do not touch or rub your eyes.  Protect your eyes as told by your health care provider. You may be told to wear a protective eye shield or sunglasses.  Do not put a contact lens into the affected eye or eyes until your health care provider approves.  Keep the area around your eye clean and dry: ? Avoid swimming. ? Do not allow water to hit you directly in the face while showering. ? Keep soap and shampoo out of your eyes.  Check your eye every day for signs of infection. Watch for: ? Redness, swelling, or pain. ? Fluid, blood, or pus. ? Warmth. ? A bad smell. ? Vision that is getting worse. ? Sensitivity that is getting worse. Activity  Do not drive for 24 hours if you were given a sedative during your procedure.  Avoid strenuous activities, such as playing contact sports, for as long as told by your health care provider.  Do not drive or use heavy machinery until your health care provider approves.  Do not bend or lift heavy objects. Bending increases pressure in the eye. You can walk, climb stairs, and do light household chores.  Ask your health care provider when you can return to work. If you work in a dusty environment, you may be advised to wear protective eyewear for a period of time. General instructions  Take or apply over-the-counter and prescription medicines only as told by your health care provider. This includes eye drops.  Keep all follow-up visits as told by your health care provider. This is important. Contact a health care provider if:  You have increased bruising around your eye.  You have pain that is  not helped with medicine.  You have a fever.  You have redness, swelling, or pain in your eye.  You have fluid, blood, or pus coming from your incision.  Your vision gets worse.  Your sensitivity to light gets worse. Get help right away if:  You have sudden loss of vision.  You see flashes of light or spots (floaters).  You have severe eye pain.  You develop nausea or vomiting. Summary  After your procedure, it is common to have itching, discomfort, bruising, fluid discharge, or sensitivity to light.  Follow instructions from your health care provider about caring for your eye after the procedure.  Do not rub your eye after the procedure. You may need to wear eye protection or sunglasses. Do not wear contact lenses. Keep the area around your eye clean and dry.  Avoid activities that require a lot of effort. These include playing sports and lifting heavy objects.  Contact a health care provider if you have increased bruising, pain that does not go away, or a fever. Get help right   away if you suddenly lose your vision, see flashes of light or spots, or have severe pain in the eye. This information is not intended to replace advice given to you by your health care provider. Make sure you discuss any questions you have with your health care provider. Document Revised: 03/01/2019 Document Reviewed: 11/02/2017 Elsevier Patient Education  2020 Elsevier Inc.  

## 2019-06-07 ENCOUNTER — Ambulatory Visit: Payer: Medicare Other | Admitting: Anesthesiology

## 2019-06-07 ENCOUNTER — Encounter: Payer: Self-pay | Admitting: Ophthalmology

## 2019-06-07 ENCOUNTER — Encounter
Admission: RE | Disposition: A | Discharge status: Home / Self Care | Payer: Self-pay | Source: Home / Self Care | Attending: Ophthalmology

## 2019-06-07 ENCOUNTER — Other Ambulatory Visit: Payer: Self-pay

## 2019-06-07 ENCOUNTER — Ambulatory Visit
Admission: RE | Admit: 2019-06-07 | Discharge: 2019-06-07 | Disposition: A | Discharge status: Home / Self Care | Payer: Medicare Other | Attending: Ophthalmology | Admitting: Ophthalmology

## 2019-06-07 DIAGNOSIS — E7800 Pure hypercholesterolemia, unspecified: Secondary | ICD-10-CM | POA: Insufficient documentation

## 2019-06-07 DIAGNOSIS — H2511 Age-related nuclear cataract, right eye: Secondary | ICD-10-CM | POA: Insufficient documentation

## 2019-06-07 DIAGNOSIS — G250 Essential tremor: Secondary | ICD-10-CM | POA: Insufficient documentation

## 2019-06-07 DIAGNOSIS — Z853 Personal history of malignant neoplasm of breast: Secondary | ICD-10-CM | POA: Diagnosis not present

## 2019-06-07 DIAGNOSIS — R7303 Prediabetes: Secondary | ICD-10-CM | POA: Diagnosis not present

## 2019-06-07 DIAGNOSIS — E039 Hypothyroidism, unspecified: Secondary | ICD-10-CM | POA: Insufficient documentation

## 2019-06-07 DIAGNOSIS — H25811 Combined forms of age-related cataract, right eye: Secondary | ICD-10-CM | POA: Diagnosis not present

## 2019-06-07 HISTORY — DX: Syncope and collapse: R55

## 2019-06-07 HISTORY — PX: CATARACT EXTRACTION W/PHACO: SHX586

## 2019-06-07 SURGERY — PHACOEMULSIFICATION, CATARACT, WITH IOL INSERTION
Anesthesia: Monitor Anesthesia Care | Site: Eye | Laterality: Right

## 2019-06-07 MED ORDER — EPINEPHRINE PF 1 MG/ML IJ SOLN
INTRAOCULAR | Status: DC | PRN
  Administered 2019-06-07: 47 mL via OPHTHALMIC

## 2019-06-07 MED ORDER — FENTANYL CITRATE (PF) 100 MCG/2ML IJ SOLN
INTRAMUSCULAR | Status: DC | PRN
  Administered 2019-06-07: 100 ug via INTRAVENOUS

## 2019-06-07 MED ORDER — ONDANSETRON HCL 4 MG/2ML IJ SOLN: 4.0000 mg | Freq: Once | INTRAMUSCULAR | Status: DC | PRN

## 2019-06-07 MED ORDER — LACTATED RINGERS IV SOLN: 100.0000 mL/h | INTRAVENOUS | Status: DC

## 2019-06-07 MED ORDER — BRIMONIDINE TARTRATE-TIMOLOL 0.2-0.5 % OP SOLN
OPHTHALMIC | Status: DC | PRN
  Administered 2019-06-07: 1 [drp] via OPHTHALMIC

## 2019-06-07 MED ORDER — MIDAZOLAM HCL 2 MG/2ML IJ SOLN
INTRAMUSCULAR | Status: DC | PRN
  Administered 2019-06-07: 2 mg via INTRAVENOUS

## 2019-06-07 MED ORDER — ACETAMINOPHEN 10 MG/ML IV SOLN: 1000.0000 mg | Freq: Once | INTRAVENOUS | Status: DC | PRN

## 2019-06-07 MED ORDER — LIDOCAINE HCL (PF) 2 % IJ SOLN
INTRAOCULAR | Status: DC | PRN
  Administered 2019-06-07: 2 mL

## 2019-06-07 MED ORDER — NA CHONDROIT SULF-NA HYALURON 40-17 MG/ML IO SOLN
INTRAOCULAR | Status: DC | PRN
  Administered 2019-06-07: 1 mL via INTRAOCULAR

## 2019-06-07 MED ORDER — MOXIFLOXACIN HCL 0.5 % OP SOLN
OPHTHALMIC | Status: DC | PRN
  Administered 2019-06-07: 0.2 mL via OPHTHALMIC

## 2019-06-07 MED ORDER — TETRACAINE HCL 0.5 % OP SOLN
1.0000 [drp] | OPHTHALMIC | Status: DC | PRN
  Administered 2019-06-07 (×3): 1 [drp] via OPHTHALMIC

## 2019-06-07 MED ORDER — ARMC OPHTHALMIC DILATING DROPS
1.0000 "application " | OPHTHALMIC | Status: DC | PRN
  Administered 2019-06-07 (×3): 1 via OPHTHALMIC

## 2019-06-07 MED ORDER — ONDANSETRON HCL 4 MG/2ML IJ SOLN
INTRAMUSCULAR | Status: DC | PRN
  Administered 2019-06-07: 4 mg via INTRAVENOUS

## 2019-06-07 SURGICAL SUPPLY — 20 items
CANNULA ANT/CHMB 27G (MISCELLANEOUS) ×2 IMPLANT
CANNULA ANT/CHMB 27GA (MISCELLANEOUS) ×4 IMPLANT
GLOVE SURG LX 8.0 MICRO (GLOVE) ×1
GLOVE SURG LX STRL 8.0 MICRO (GLOVE) ×1 IMPLANT
GLOVE SURG TRIUMPH 8.0 PF LTX (GLOVE) ×2 IMPLANT
GOWN STRL REUS W/ TWL LRG LVL3 (GOWN DISPOSABLE) ×2 IMPLANT
GOWN STRL REUS W/TWL LRG LVL3 (GOWN DISPOSABLE) ×2
LENS IOL TECNIS ITEC 23.0 (Intraocular Lens) ×1 IMPLANT
MARKER SKIN DUAL TIP RULER LAB (MISCELLANEOUS) ×2 IMPLANT
NDL FILTER BLUNT 18X1 1/2 (NEEDLE) ×1 IMPLANT
NDL RETROBULBAR .5 NSTRL (NEEDLE) ×2 IMPLANT
NEEDLE FILTER BLUNT 18X 1/2SAF (NEEDLE) ×1
NEEDLE FILTER BLUNT 18X1 1/2 (NEEDLE) ×1 IMPLANT
PACK EYE AFTER SURG (MISCELLANEOUS) ×2 IMPLANT
PACK OPTHALMIC (MISCELLANEOUS) ×2 IMPLANT
PACK PORFILIO (MISCELLANEOUS) ×2 IMPLANT
SYR 3ML LL SCALE MARK (SYRINGE) ×2 IMPLANT
SYR TB 1ML LUER SLIP (SYRINGE) ×2 IMPLANT
WATER STERILE IRR 250ML POUR (IV SOLUTION) ×2 IMPLANT
WIPE NON LINTING 3.25X3.25 (MISCELLANEOUS) ×2 IMPLANT

## 2019-06-07 NOTE — Anesthesia Procedure Notes (Signed)
Procedure Name: MAC Performed by: Henry Utsey M, CRNA Pre-anesthesia Checklist: Timeout performed, Patient being monitored, Suction available, Emergency Drugs available and Patient identified Patient Re-evaluated:Patient Re-evaluated prior to induction Oxygen Delivery Method: Nasal cannula       

## 2019-06-07 NOTE — Transfer of Care (Signed)
Immediate Anesthesia Transfer of Care Note  Patient: Tiffany Chang  Procedure(s) Performed: CATARACT EXTRACTION PHACO AND INTRAOCULAR LENS PLACEMENT (IOC) RIGHT 4.62 00:33.5 (Right Eye)  Patient Location: PACU  Anesthesia Type: MAC  Level of Consciousness: awake, alert  and patient cooperative  Airway and Oxygen Therapy: Patient Spontanous Breathing and Patient connected to supplemental oxygen  Post-op Assessment: Post-op Vital signs reviewed, Patient's Cardiovascular Status Stable, Respiratory Function Stable, Patent Airway and No signs of Nausea or vomiting  Post-op Vital Signs: Reviewed and stable  Complications: No apparent anesthesia complications

## 2019-06-07 NOTE — H&P (Signed)
All labs reviewed. Abnormal studies sent to patients PCP when indicated.  Previous H&P reviewed, patient examined, there are NO CHANGES.  Crystall Donaldson Porfilio1/19/20218:30 AM

## 2019-06-07 NOTE — Op Note (Signed)
PREOPERATIVE DIAGNOSIS:  Nuclear sclerotic cataract of the right eye.   POSTOPERATIVE DIAGNOSIS:  H25.11 Cataract   OPERATIVE PROCEDURE:@   SURGEON:  Birder Robson, MD.   ANESTHESIA:  Anesthesiologist: Heniser, Fredric Dine, MD CRNA: Izetta Dakin, CRNA  1.      Managed anesthesia care. 2.      0.45ml of Shugarcaine was instilled in the eye following the paracentesis.   COMPLICATIONS:  None.   TECHNIQUE:   Stop and chop   DESCRIPTION OF PROCEDURE:  The patient was examined and consented in the preoperative holding area where the aforementioned topical anesthesia was applied to the right eye and then brought back to the Operating Room where the right eye was prepped and draped in the usual sterile ophthalmic fashion and a lid speculum was placed. A paracentesis was created with the side port blade and the anterior chamber was filled with viscoelastic. A near clear corneal incision was performed with the steel keratome. A continuous curvilinear capsulorrhexis was performed with a cystotome followed by the capsulorrhexis forceps. Hydrodissection and hydrodelineation were carried out with BSS on a blunt cannula. The lens was removed in a stop and chop  technique and the remaining cortical material was removed with the irrigation-aspiration handpiece. The capsular bag was inflated with viscoelastic and the Technis [id number]  lens was placed in the capsular bag without complication. The remaining viscoelastic was removed from the eye with the irrigation-aspiration handpiece. The wounds were hydrated. The anterior chamber was flushed with BSS and the eye was inflated to physiologic pressure. 0.65ml of Vigamox was placed in the anterior chamber. The wounds were found to be water tight. The eye was dressed with Combigan. The patient was given protective glasses to wear throughout the day and a shield with which to sleep tonight. The patient was also given drops with which to begin a drop regimen today and  will follow-up with me in one day. Implant Name Type Inv. Item Serial No. Manufacturer Lot No. LRB No. Used Action  LENS IOL DIOP 23.0 - [id number] Intraocular Lens LENS IOL DIOP 23.0 [id number]  Right 1 Implanted   Procedure(s): CATARACT EXTRACTION PHACO AND INTRAOCULAR LENS PLACEMENT (IOC) RIGHT 4.62 00:33.5 (Right)  Electronically signed: Birder Robson 06/07/2019 8:56 AM

## 2019-06-07 NOTE — Anesthesia Procedure Notes (Signed)
Procedure Name: MAC Performed by: Izetta Dakin, CRNA Pre-anesthesia Checklist: Timeout performed, Patient being monitored, Suction available, Emergency Drugs available and Patient identified Patient Re-evaluated:Patient Re-evaluated prior to induction Oxygen Delivery Method: Nasal cannula

## 2019-06-07 NOTE — Anesthesia Postprocedure Evaluation (Signed)
Anesthesia Post Note  Patient: Tiffany Chang  Procedure(s) Performed: CATARACT EXTRACTION PHACO AND INTRAOCULAR LENS PLACEMENT (IOC) RIGHT 4.62 00:33.5 (Right Eye)     Patient location during evaluation: PACU Anesthesia Type: MAC Level of consciousness: awake and alert Pain management: pain level controlled Vital Signs Assessment: post-procedure vital signs reviewed and stable Respiratory status: spontaneous breathing, nonlabored ventilation, respiratory function stable and patient connected to nasal cannula oxygen Cardiovascular status: stable and blood pressure returned to baseline Postop Assessment: no apparent nausea or vomiting Anesthetic complications: no    Donnamarie Shankles A  Armin Yerger

## 2019-06-08 ENCOUNTER — Encounter: Payer: Self-pay | Admitting: *Deleted

## 2019-07-28 ENCOUNTER — Other Ambulatory Visit: Payer: Self-pay | Admitting: Internal Medicine

## 2019-08-08 ENCOUNTER — Other Ambulatory Visit: Payer: Self-pay

## 2019-08-10 ENCOUNTER — Encounter: Payer: Self-pay | Admitting: Internal Medicine

## 2019-08-10 ENCOUNTER — Ambulatory Visit (INDEPENDENT_AMBULATORY_CARE_PROVIDER_SITE_OTHER): Payer: Medicare Other | Admitting: Internal Medicine

## 2019-08-10 ENCOUNTER — Other Ambulatory Visit: Payer: Self-pay

## 2019-08-10 VITALS — BP 142/96 | HR 64 | Temp 96.5°F | Resp 15 | Ht 68.0 in | Wt 164.0 lb

## 2019-08-10 DIAGNOSIS — M8580 Other specified disorders of bone density and structure, unspecified site: Secondary | ICD-10-CM | POA: Diagnosis not present

## 2019-08-10 DIAGNOSIS — D126 Benign neoplasm of colon, unspecified: Secondary | ICD-10-CM

## 2019-08-10 DIAGNOSIS — R5383 Other fatigue: Secondary | ICD-10-CM | POA: Diagnosis not present

## 2019-08-10 DIAGNOSIS — R251 Tremor, unspecified: Secondary | ICD-10-CM | POA: Diagnosis not present

## 2019-08-10 DIAGNOSIS — E559 Vitamin D deficiency, unspecified: Secondary | ICD-10-CM

## 2019-08-10 DIAGNOSIS — Z8249 Family history of ischemic heart disease and other diseases of the circulatory system: Secondary | ICD-10-CM | POA: Diagnosis not present

## 2019-08-10 DIAGNOSIS — R030 Elevated blood-pressure reading, without diagnosis of hypertension: Secondary | ICD-10-CM | POA: Diagnosis not present

## 2019-08-10 DIAGNOSIS — E7800 Pure hypercholesterolemia, unspecified: Secondary | ICD-10-CM | POA: Diagnosis not present

## 2019-08-10 DIAGNOSIS — E039 Hypothyroidism, unspecified: Secondary | ICD-10-CM | POA: Diagnosis not present

## 2019-08-10 LAB — COMPREHENSIVE METABOLIC PANEL
ALT: 15 U/L (ref 0–35)
AST: 15 U/L (ref 0–37)
Albumin: 4.4 g/dL (ref 3.5–5.2)
Alkaline Phosphatase: 82 U/L (ref 39–117)
BUN: 15 mg/dL (ref 6–23)
CO2: 31 mEq/L (ref 19–32)
Calcium: 9.5 mg/dL (ref 8.4–10.5)
Chloride: 103 mEq/L (ref 96–112)
Creatinine, Ser: 0.66 mg/dL (ref 0.40–1.20)
GFR: 88.15 mL/min (ref >60.00)
Glucose, Bld: 96 mg/dL (ref 70–99)
Potassium: 4.8 mEq/L (ref 3.5–5.1)
Sodium: 139 mEq/L (ref 135–145)
Total Bilirubin: 0.3 mg/dL (ref 0.2–1.2)
Total Protein: 7.1 g/dL (ref 6.0–8.3)

## 2019-08-10 LAB — MICROALBUMIN / CREATININE URINE RATIO
Creatinine,U: 72.1 mg/dL
Microalb Creat Ratio: 1 mg/g (ref 0.0–30.0)
Microalb, Ur: <0.7 mg/dL (ref 0.0–1.9)

## 2019-08-10 LAB — CBC WITH DIFFERENTIAL/PLATELET
Basophils Absolute: 0.1 10*3/uL (ref 0.0–0.1)
Basophils Relative: 0.9 % (ref 0.0–3.0)
Eosinophils Absolute: 0.2 10*3/uL (ref 0.0–0.7)
Eosinophils Relative: 3.5 % (ref 0.0–5.0)
HCT: 42.1 % (ref 36.0–46.0)
Hemoglobin: 14.2 g/dL (ref 12.0–15.0)
Lymphocytes Relative: 24.6 % (ref 12.0–46.0)
Lymphs Abs: 1.5 10*3/uL (ref 0.7–4.0)
MCHC: 33.7 g/dL (ref 30.0–36.0)
MCV: 88.8 fl (ref 78.0–100.0)
Monocytes Absolute: 0.3 10*3/uL (ref 0.1–1.0)
Monocytes Relative: 5.1 % (ref 3.0–12.0)
Neutro Abs: 4.1 10*3/uL (ref 1.4–7.7)
Neutrophils Relative %: 65.9 % (ref 43.0–77.0)
Platelets: 320 10*3/uL (ref 150.0–400.0)
RBC: 4.75 Mil/uL (ref 3.87–5.11)
RDW: 13.9 % (ref 11.5–15.5)
WBC: 6.2 10*3/uL (ref 4.0–10.5)

## 2019-08-10 LAB — LIPID PANEL
Cholesterol: 176 mg/dL (ref 0–200)
HDL: 43.3 mg/dL (ref >39.00)
LDL Cholesterol: 106 mg/dL — ABNORMAL HIGH (ref 0–99)
NonHDL: 132.77
Total CHOL/HDL Ratio: 4
Triglycerides: 135 mg/dL (ref 0.0–149.0)
VLDL: 27 mg/dL (ref 0.0–40.0)

## 2019-08-10 LAB — TSH: TSH: 1.54 u[IU]/mL (ref 0.35–4.50)

## 2019-08-10 LAB — VITAMIN D 25 HYDROXY (VIT D DEFICIENCY, FRACTURES): VITD: 33.18 ng/mL (ref 30.00–100.00)

## 2019-08-10 NOTE — Progress Notes (Signed)
Patient ID: Tiffany Chang, female    DOB: 1945/08/15  Age: 74 y.o. MRN: [id number]  The patient is here for follow up and management of other chronic and acute problems.  This visit occurred during the SARS-CoV-2 public health emergency.  Safety protocols were in place, including screening questions prior to the visit, additional usage of staff PPE, and extensive cleaning of exam room while observing appropriate contact time as indicated for disinfecting solutions.    The risk factors are reflected in the social history.  The roster of all physicians providing medical care to patient - is listed in the Snapshot section of the chart.  Activities of daily living:  The patient is 100% independent in all ADLs: dressing, toileting, feeding as well as independent mobility  Home safety : The patient has smoke detectors in the home. They wear seatbelts.  There are no firearms at home. There is no violence in the home.   There is no risks for hepatitis, STDs or HIV. There is no   history of blood transfusion. They have no travel history to infectious disease endemic areas of the world.  The patient has seen their dentist in the last six month. They have seen their eye doctor in the last year. They admit to slight hearing difficulty with regard to whispered voices and some television programs.  They have deferred audiologic testing in the last year.  They do not  have excessive sun exposure. Discussed the need for sun protection: hats, long sleeves and use of sunscreen if there is significant sun exposure.   Diet: the importance of a healthy diet is discussed. They do have a healthy diet.  The benefits of regular aerobic exercise were discussed. She walks 4 times per week ,  20 minutes.   Depression screen: there are no signs or vegative symptoms of depression- irritability, change in appetite, anhedonia, sadness/tearfullness.  Cognitive assessment: the patient manages all their financial and personal  affairs and is actively engaged. They could relate day,date,year and events; recalled 2/3 objects at 3 minutes; performed clock-face test normally.  The following portions of the patient's history were reviewed and updated as appropriate: allergies, current medications, past family history, past medical history,  past surgical history, past social history  and problem list.  Visual acuity was not assessed per patient preference since she has regular follow up with her ophthalmologist. Hearing and body mass index were assessed and reviewed.   During the course of the visit the patient was educated and counseled about appropriate screening and preventive services including : fall prevention , diabetes screening, nutrition counseling, colorectal cancer screening, and recommended immunizations.    CC: The primary encounter diagnosis was Acquired hypothyroidism. Diagnoses of Tubular adenoma of colon, Pure hypercholesterolemia, Vitamin D deficiency, Elevated blood pressure reading in office without diagnosis of hypertension, Fatigue, unspecified type, Family history of abdominal aortic aneurysm, Osteopenia, unspecified location, and Tremor were also pertinent to this visit.  1) Hyperlipidemia: wants to take a break due to muscle stiffness .  Wants to take a break from medication for 6 months  2) Has increased her primidone dose due to tremor.  Seeing Dr Carles Collet in April.  Considering 2nd opinion for treatment of ET (DBS)  3) Year 5 of Letrozole , sees tim Finnegan annually  For Breast CA 2016  Concerned about the effect on estrogen loss   Due for colonoscopy   5 yr followup was due In 2019  4) Blood pressure elevated due to 2  hr drive in the fog.   5) Aortic US 2015 normal  She has a FH of aortic aneurysm   2nd cataract removed by porfilio with lens implantation       History Chesterine has a past medical history of Breast cancer ([place]) (2016), Hashimoto's thyroiditis, History of arthroscopy of left knee  (1985), Hyperlipidemia, Hypothyroid, Osteoporosis, Spinal stenosis, Syncope, vasovagal, Tremor, and Viral meningitis.   She has a past surgical history that includes Foot surgery; Knee arthroscopy (1985); Cholecystectomy (2006); Tonsillectomy; Cesarean section; Simple mastectomy with axillary sentinel node biopsy (Right, 10/10/2014); Sentinel node biopsy (Right, 10/10/2014); Mastectomy (Right, 2016); Cataract extraction w/PHACO (Left, 09/30/2017); and Cataract extraction w/PHACO (Right, 06/07/2019).   Her family history includes Aortic aneurysm in her father; Breast cancer (age of onset: 34) in her sister; Breast cancer (age of onset: 58) in her maternal grandmother; Cancer in her maternal grandfather; Cancer (age of onset: 33) in her sister; Osteoporosis in her mother.She reports that she has never smoked. She has never used smokeless tobacco. She reports current alcohol use of about 3.0 standard drinks of alcohol per week. She reports that she does not use drugs.  Outpatient Medications Prior to Visit  Medication Sig Dispense Refill  . Cholecalciferol (VITAMIN D3) 50 MCG (2000 UT) TABS Take by mouth daily.    Marland Kitchen letrozole (FEMARA) 2.5 MG tablet TAKE 1 TABLET BY MOUTH EVERY DAY 90 tablet 1  . levothyroxine (SYNTHROID) 88 MCG tablet TAKE 1 TABLET BY MOUTH EVERY DAY 90 tablet 0  . naproxen sodium (ALEVE) 220 MG tablet Take 220 mg by mouth 2 (two) times daily as needed (pain).    . primidone (MYSOLINE) 50 MG tablet Take 2 tablets (100 mg total) by mouth 2 (two) times daily. (Patient taking differently: Take 100 mg by mouth 2 (two) times daily. Taking 150 mg in the AM and 100 mg in the PM) 360 tablet 1  . rosuvastatin (CRESTOR) 5 MG tablet TAKE 1 TABLET BY MOUTH EVERY DAY (Patient not taking: Reported on 08/11/2019) 90 tablet 1  . propranolol (INDERAL) 40 MG tablet TAKE 1 TABLET BY MOUTH TWICE A DAY 180 tablet 1   No facility-administered medications prior to visit.    Review of Systems   Patient denies  headache, fevers, malaise, unintentional weight loss, skin rash, eye pain, sinus congestion and sinus pain, sore throat, dysphagia,  hemoptysis , cough, dyspnea, wheezing, chest pain, palpitations, orthopnea, edema, abdominal pain, nausea, melena, diarrhea, constipation, flank pain, dysuria, hematuria, urinary  Frequency, nocturia, numbness, tingling, seizures,  Focal weakness, Loss of consciousness,  Tremor, insomnia, depression, anxiety, and suicidal ideation.      Objective:  BP (!) 142/96 (BP Location: Left Arm, Patient Position: Sitting, Cuff Size: Normal)   Pulse 64   Temp (!) 96.5 F (35.8 C) (Temporal)   Resp 15   Ht 5\' 8"  (1.727 m)   Wt 164 lb (74.4 kg)   SpO2 98%   BMI 24.94 kg/m   Physical Exam  General appearance: alert, cooperative and appears stated age Head: Normocephalic, without obvious abnormality, atraumatic Eyes: conjunctivae/corneas clear. PERRL, EOM's intact. Fundi benign. Ears: normal TM's and external ear canals both ears Nose: Nares normal. Septum midline. Mucosa normal. No drainage or sinus tenderness. Throat: lips, mucosa, and tongue normal; teeth and gums normal Neck: no adenopathy, no carotid bruit, no JVD, supple, symmetrical, trachea midline and thyroid not enlarged, symmetric, no tenderness/mass/nodules Lungs: clear to auscultation bilaterally Breasts: right mastectomy,  Left breast with normal appearance, no masses  or tenderness Heart: regular rate and rhythm, S1, S2 normal, no murmur, click, rub or gallop Abdomen: soft, non-tender; bowel sounds normal; no masses,  no organomegaly Extremities: extremities normal, atraumatic, no cyanosis or edema Pulses: 2+ and symmetric Skin: Skin color, texture, turgor normal. No rashes or lesions Neurologic: Alert and oriented X 3, normal strength and tone. Normal symmetric reflexes. Normal coordination and gait.    Assessment & Plan:   Problem List Items Addressed This Visit      Unprioritized   Osteopenia     Managed with Evista prior to diagnosis of breast cancer.  She has been taking Letrozole for 5 years; and her T scores have worsened.       Vitamin D deficiency   Relevant Orders   VITAMIN D 25 Hydroxy (Vit-D Deficiency, Fractures) (Completed)   Family history of abdominal aortic aneurysm    She was screened in 2015 for AA ; none found. There are no recommendations to repeat screening in women with a FH of AA      Hyperlipidemia    She is not tolerating Crestor and would like definitive noninvasive  testing to be sure she needs statin therapy. Referral to [facility] for cardiac CT calcium scoring       Relevant Orders   Lipid panel (Completed)   Ambulatory referral to Cardiology   Acquired hypothyroidism - Primary   Relevant Orders   TSH (Completed)   Tremor    Managed with primidone at night . No changes today , but she is noting progression of tremor and is considering DBS as therapeutic intervention.       Tubular adenoma of colon   Relevant Orders   Ambulatory referral to Gastroenterology    Other Visit Diagnoses    Elevated blood pressure reading in office without diagnosis of hypertension       Relevant Orders   Comprehensive metabolic panel (Completed)   Microalbumin / creatinine urine ratio (Completed)   Fatigue, unspecified type       Relevant Orders   CBC with Differential/Platelet (Completed)      I am having William Dalton maintain her naproxen sodium, rosuvastatin, primidone, letrozole, Vitamin D3, and levothyroxine.  No orders of the defined types were placed in this encounter.   There are no discontinued medications.  Follow-up: Return in about 6 months (around 02/10/2020).   Crecencio Mc, MD

## 2019-08-10 NOTE — Patient Instructions (Signed)
Referrals to [facility] and [facility] are in progress  If you have not been contacted in a week,  Let me know   Health Maintenance After Age 72 After age 50, you are at a higher risk for certain long-term diseases and infections as well as injuries from falls. Falls are a major cause of broken bones and head injuries in people who are older than age 68. Getting regular preventive care can help to keep you healthy and well. Preventive care includes getting regular testing and making lifestyle changes as recommended by your health care provider. Talk with your health care provider about:  Which screenings and tests you should have. A screening is a test that checks for a disease when you have no symptoms.  A diet and exercise plan that is right for you. What should I know about screenings and tests to prevent falls? Screening and testing are the best ways to find a health problem early. Early diagnosis and treatment give you the best chance of managing medical conditions that are common after age 39. Certain conditions and lifestyle choices may make you more likely to have a fall. Your health care provider may recommend:  Regular vision checks. Poor vision and conditions such as cataracts can make you more likely to have a fall. If you wear glasses, make sure to get your prescription updated if your vision changes.  Medicine review. Work with your health care provider to regularly review all of the medicines you are taking, including over-the-counter medicines. Ask your health care provider about any side effects that may make you more likely to have a fall. Tell your health care provider if any medicines that you take make you feel dizzy or sleepy.  Osteoporosis screening. Osteoporosis is a condition that causes the bones to get weaker. This can make the bones weak and cause them to break more easily.  Blood pressure screening. Blood pressure changes and medicines to control blood pressure can  make you feel dizzy.  Strength and balance checks. Your health care provider may recommend certain tests to check your strength and balance while standing, walking, or changing positions.  Foot health exam. Foot pain and numbness, as well as not wearing proper footwear, can make you more likely to have a fall.  Depression screening. You may be more likely to have a fall if you have a fear of falling, feel emotionally low, or feel unable to do activities that you used to do.  Alcohol use screening. Using too much alcohol can affect your balance and may make you more likely to have a fall. What actions can I take to lower my risk of falls? General instructions  Talk with your health care provider about your risks for falling. Tell your health care provider if: ? You fall. Be sure to tell your health care provider about all falls, even ones that seem minor. ? You feel dizzy, sleepy, or off-balance.  Take over-the-counter and prescription medicines only as told by your health care provider. These include any supplements.  Eat a healthy diet and maintain a healthy weight. A healthy diet includes low-fat dairy products, low-fat (lean) meats, and fiber from whole grains, beans, and lots of fruits and vegetables. Home safety  Remove any tripping hazards, such as rugs, cords, and clutter.  Install safety equipment such as grab bars in bathrooms and safety rails on stairs.  Keep rooms and walkways well-lit. Activity   Follow a regular exercise program to stay fit. This  will help you maintain your balance. Ask your health care provider what types of exercise are appropriate for you.  If you need a cane or walker, use it as recommended by your health care provider.  Wear supportive shoes that have nonskid soles. Lifestyle  Do not drink alcohol if your health care provider tells you not to drink.  If you drink alcohol, limit how much you have: ? 0-1 drink a day for women. ? 0-2 drinks a  day for men.  Be aware of how much alcohol is in your drink. In the [place] one drink equals one typical bottle of beer (12 oz), one-half glass of wine (5 oz), or one shot of hard liquor (1 oz).  Do not use any products that contain nicotine or tobacco, such as cigarettes and e-cigarettes. If you need help quitting, ask your health care provider. Summary  Having a healthy lifestyle and getting preventive care can help to protect your health and wellness after age 4.  Screening and testing are the best way to find a health problem early and help you avoid having a fall. Early diagnosis and treatment give you the best chance for managing medical conditions that are more common for people who are older than age 15.  Falls are a major cause of broken bones and head injuries in people who are older than age 23. Take precautions to prevent a fall at home.  Work with your health care provider to learn what changes you can make to improve your health and wellness and to prevent falls. This information is not intended to replace advice given to you by your health care provider. Make sure you discuss any questions you have with your health care provider. Document Revised: 08/26/2018 Document Reviewed: 03/18/2017 Elsevier Patient Education  2020 [employer].

## 2019-08-11 ENCOUNTER — Ambulatory Visit (INDEPENDENT_AMBULATORY_CARE_PROVIDER_SITE_OTHER): Payer: Self-pay | Admitting: Gastroenterology

## 2019-08-11 VITALS — Ht 68.0 in | Wt 164.0 lb

## 2019-08-11 DIAGNOSIS — D126 Benign neoplasm of colon, unspecified: Secondary | ICD-10-CM

## 2019-08-11 MED ORDER — NA SULFATE-K SULFATE-MG SULF 17.5-3.13-1.6 GM/177ML PO SOLN: 1.0000 | Freq: Once | ORAL | 0 refills | Status: AC

## 2019-08-11 NOTE — Progress Notes (Signed)
Gastroenterology Pre-Procedure Review  Request Date: Monday May 10th Requesting Physician: Dr. Allen Norris  PATIENT REVIEW QUESTIONS: The patient responded to the following health history questions as indicated:    1. Are you having any GI issues? occasional constipation 2. Do you have a personal history of Polyps? yes (2014 tubular adenoma) 3. Do you have a family history of Colon Cancer or Polyps? no 4. Diabetes Mellitus? no 5. Joint replacements in the past 12 months?no 6. Major health problems in the past 3 months?yes (Cataract Surgery 2021) 7. Any artificial heart valves, MVP, or defibrillator?no    MEDICATIONS & ALLERGIES:    Patient reports the following regarding taking any anticoagulation/antiplatelet therapy:   Plavix, Coumadin, Eliquis, Xarelto, Lovenox, Pradaxa, Brilinta, or Effient? no Aspirin? no  Patient confirms/reports the following medications:  Current Outpatient Medications  Medication Sig Dispense Refill  . Cholecalciferol (VITAMIN D3) 50 MCG (2000 UT) TABS Take by mouth daily.    Marland Kitchen letrozole (FEMARA) 2.5 MG tablet TAKE 1 TABLET BY MOUTH EVERY DAY 90 tablet 1  . levothyroxine (SYNTHROID) 88 MCG tablet TAKE 1 TABLET BY MOUTH EVERY DAY 90 tablet 0  . naproxen sodium (ALEVE) 220 MG tablet Take 220 mg by mouth 2 (two) times daily as needed (pain).    . primidone (MYSOLINE) 50 MG tablet Take 2 tablets (100 mg total) by mouth 2 (two) times daily. (Patient taking differently: Take 100 mg by mouth 2 (two) times daily. Taking 150 mg in the AM and 100 mg in the PM) 360 tablet 1  . propranolol (INDERAL) 40 MG tablet TAKE 1 TABLET BY MOUTH TWICE A DAY 180 tablet 1  . rosuvastatin (CRESTOR) 5 MG tablet TAKE 1 TABLET BY MOUTH EVERY DAY 90 tablet 1   No current facility-administered medications for this visit.    Patient confirms/reports the following allergies:  Allergies  Allergen Reactions  . Statins Other (See Comments)     Leg cramps in high doses  . Bee Venom Hives     No orders of the defined types were placed in this encounter.   AUTHORIZATION INFORMATION Primary Insurance: 1D#: Group #:  Secondary Insurance: 1D#: Group #:  SCHEDULE INFORMATION: Date:  Time: Location:

## 2019-08-11 NOTE — Patient Instructions (Addendum)
Colonoscopy has been scheduled for Monday May 10th with Dr. Darren Wohl at [facility].  The nurse will call on Friday May 7th to give the arrival time.  COVID Test is mandatory.  COVID Testing Thursday May 6th at [facility] arrive between 8am-1pm for drive up testing.  SuPrep Bowel Kit has been sent electronically to [facility] [phone].  Thank you for allowing [facility] to serve your healthcare needs. If you have any questions please call the office at [phone].  [facility] 

## 2019-08-12 ENCOUNTER — Other Ambulatory Visit: Payer: Self-pay | Admitting: Neurology

## 2019-08-13 NOTE — Assessment & Plan Note (Signed)
She was screened in 2015 for AA ; none found. There are no recommendations to repeat screening in women with a FH of AA

## 2019-08-13 NOTE — Assessment & Plan Note (Addendum)
Managed with Evista prior to diagnosis of breast cancer.  She has been taking Letrozole for 5 years; and her T scores have worsened.

## 2019-08-13 NOTE — Assessment & Plan Note (Signed)
Managed with primidone at night . No changes today , but she is noting progression of tremor and is considering DBS as therapeutic intervention.

## 2019-08-13 NOTE — Assessment & Plan Note (Addendum)
She is not tolerating Crestor and would like definitive noninvasive  testing to be sure she needs statin therapy. Referral to [facility] for cardiac CT calcium scoring

## 2019-09-09 NOTE — Progress Notes (Deleted)
Assessment/Plan:    1.  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  ***  Subjective:   Tiffany Chang was seen today in follow up for essential tremor.  My previous records were reviewed prior to todays visit.  She did message me since last visit and we have increased her primidone.  She reports that ***.   pt denies falls.  Pt denies lightheadedness, near syncope.  No hallucinations.  Mood has been good.  Patient saw her primary care physician on March 24.  I have reviewed those records.  Current prescribed movement disorder medications: ***Primidone, 40 mg twice per day Primidone, 50 mg, 3 tablets in the morning and 2 tablets at night   PREVIOUS MEDICATIONS: {Parkinson's RX:18200}  ALLERGIES:   Allergies  Allergen Reactions  . Statins Other (See Comments)     Leg cramps in high doses  . Bee Venom Hives    CURRENT MEDICATIONS:  Outpatient Encounter Medications as of 09/13/2019  Medication Sig  . Cholecalciferol (VITAMIN D3) 50 MCG (2000 UT) TABS Take by mouth daily.  Marland Kitchen letrozole (FEMARA) 2.5 MG tablet TAKE 1 TABLET BY MOUTH EVERY DAY  . levothyroxine (SYNTHROID) 88 MCG tablet TAKE 1 TABLET BY MOUTH EVERY DAY  . naproxen sodium (ALEVE) 220 MG tablet Take 220 mg by mouth 2 (two) times daily as needed (pain).  . primidone (MYSOLINE) 50 MG tablet Take 2 tablets (100 mg total) by mouth 2 (two) times daily. (Patient taking differently: Take 100 mg by mouth 2 (two) times daily. Taking 150 mg in the AM and 100 mg in the PM)  . propranolol (INDERAL) 40 MG tablet TAKE 1 TABLET BY MOUTH TWICE A DAY  . rosuvastatin (CRESTOR) 5 MG tablet TAKE 1 TABLET BY MOUTH EVERY DAY (Patient not taking: Reported on 08/11/2019)   No facility-administered encounter medications on file as of 09/13/2019.     Objective:    PHYSICAL EXAMINATION:     VITALS:  There were no vitals filed for this visit.  GEN:  The patient appears stated age and is in NAD. HEENT:  Normocephalic, atraumatic.  The mucous membranes are moist. The superficial temporal arteries are without ropiness or tenderness. CV:  RRR Lungs:  CTAB Neck/HEME:  There are no carotid bruits bilaterally.  Neurological examination:  Orientation: The patient is alert and oriented x3. Cranial nerves: There is good facial symmetry. The speech is fluent and clear. Soft palate rises symmetrically and there is no tongue deviation. Hearing is intact to conversational tone. Sensation: Sensation is intact to light touch throughout Motor: Strength is at least antigravity x4.  Movement examination: Tone: There is normal tone in the UE/LE Abnormal movements: *** Coordination:  There is *** decremation with RAM's, *** Gait and Station: The patient has *** difficulty arising out of a deep-seated chair without the use of the hands. The patient's stride length is good I have reviewed and interpreted the following labs independently   Chemistry      Component Value Date/Time   NA 139 08/10/2019 0857   K 4.8 08/10/2019 0857   CL 103 08/10/2019 0857   CO2 31 08/10/2019 0857   BUN 15 08/10/2019 0857   CREATININE 0.66 08/10/2019 0857      Component Value Date/Time   CALCIUM 9.5 08/10/2019 0857   ALKPHOS 82 08/10/2019 0857  AST 15 08/10/2019 0857   ALT 15 08/10/2019 0857   BILITOT 0.3 08/10/2019 0857     Lab Results  Component Value Date   TSH 1.54 08/10/2019      Total time spent on today's visit was ***30 minutes, including both face-to-face time and nonface-to-face time.  Time included that spent on review of records (prior notes available to me/labs/imaging if pertinent), discussing treatment and goals, answering patient's questions and coordinating care.  Cc:  Crecencio Mc, MD/

## 2019-09-13 ENCOUNTER — Ambulatory Visit: Payer: Medicare Other | Admitting: Neurology

## 2019-09-13 DIAGNOSIS — E785 Hyperlipidemia, unspecified: Secondary | ICD-10-CM | POA: Diagnosis not present

## 2019-09-19 ENCOUNTER — Encounter: Payer: Self-pay | Admitting: Gastroenterology

## 2019-09-22 ENCOUNTER — Other Ambulatory Visit
Admission: RE | Admit: 2019-09-22 | Discharge: 2019-09-22 | Disposition: A | Discharge status: Home / Self Care | Payer: Medicare Other | Source: Ambulatory Visit | Attending: Gastroenterology | Admitting: Gastroenterology

## 2019-09-22 ENCOUNTER — Other Ambulatory Visit: Payer: Self-pay

## 2019-09-22 DIAGNOSIS — Z01812 Encounter for preprocedural laboratory examination: Secondary | ICD-10-CM | POA: Insufficient documentation

## 2019-09-22 DIAGNOSIS — Z20822 Contact with and (suspected) exposure to covid-19: Secondary | ICD-10-CM | POA: Insufficient documentation

## 2019-09-22 LAB — SARS CORONAVIRUS 2 (TAT 6-24 HRS): SARS Coronavirus 2: NEGATIVE

## 2019-09-22 MED ORDER — PRIMIDONE 50 MG PO TABS: ORAL_TABLET | ORAL | 0 refills | Status: DC

## 2019-09-22 NOTE — Telephone Encounter (Signed)
Rx(s) sent to pharmacy electronically.  

## 2019-09-22 NOTE — Discharge Instructions (Signed)
General Anesthesia, Adult, Care After This sheet gives you information about how to care for yourself after your procedure. Your health care provider may also give you more specific instructions. If you have problems or questions, contact your health care provider. What can I expect after the procedure? After the procedure, the following side effects are common:  Pain or discomfort at the IV site.  Nausea.  Vomiting.  Sore throat.  Trouble concentrating.  Feeling cold or chills.  Weak or tired.  Sleepiness and fatigue.  Soreness and body aches. These side effects can affect parts of the body that were not involved in surgery. Follow these instructions at home:  For at least 24 hours after the procedure:  Have a responsible adult stay with you. It is important to have someone help care for you until you are awake and alert.  Rest as needed.  Do not: ? Participate in activities in which you could fall or become injured. ? Drive. ? Use heavy machinery. ? Drink alcohol. ? Take sleeping pills or medicines that cause drowsiness. ? Make important decisions or sign legal documents. ? Take care of children on your own. Eating and drinking  Follow any instructions from your health care provider about eating or drinking restrictions.  When you feel hungry, start by eating small amounts of foods that are soft and easy to digest (bland), such as toast. Gradually return to your regular diet.  Drink enough fluid to keep your urine pale yellow.  If you vomit, rehydrate by drinking water, juice, or clear broth. General instructions  If you have sleep apnea, surgery and certain medicines can increase your risk for breathing problems. Follow instructions from your health care provider about wearing your sleep device: ? Anytime you are sleeping, including during daytime naps. ? While taking prescription pain medicines, sleeping medicines, or medicines that make you drowsy.  Return to  your normal activities as told by your health care provider. Ask your health care provider what activities are safe for you.  Take over-the-counter and prescription medicines only as told by your health care provider.  If you smoke, do not smoke without supervision.  Keep all follow-up visits as told by your health care provider. This is important. Contact a health care provider if:  You have nausea or vomiting that does not get better with medicine.  You cannot eat or drink without vomiting.  You have pain that does not get better with medicine.  You are unable to pass urine.  You develop a skin rash.  You have a fever.  You have redness around your IV site that gets worse. Get help right away if:  You have difficulty breathing.  You have chest pain.  You have blood in your urine or stool, or you vomit blood. Summary  After the procedure, it is common to have a sore throat or nausea. It is also common to feel tired.  Have a responsible adult stay with you for the first 24 hours after general anesthesia. It is important to have someone help care for you until you are awake and alert.  When you feel hungry, start by eating small amounts of foods that are soft and easy to digest (bland), such as toast. Gradually return to your regular diet.  Drink enough fluid to keep your urine pale yellow.  Return to your normal activities as told by your health care provider. Ask your health care provider what activities are safe for you. This information is not   intended to replace advice given to you by your health care provider. Make sure you discuss any questions you have with your health care provider. Document Revised: 05/08/2017 Document Reviewed: 12/19/2016 Elsevier Patient Education  2020 Elsevier Inc.  

## 2019-09-26 ENCOUNTER — Encounter: Payer: Self-pay | Admitting: Gastroenterology

## 2019-09-26 ENCOUNTER — Ambulatory Visit: Payer: Medicare Other | Admitting: Anesthesiology

## 2019-09-26 ENCOUNTER — Other Ambulatory Visit: Payer: Self-pay

## 2019-09-26 ENCOUNTER — Ambulatory Visit
Admission: RE | Admit: 2019-09-26 | Discharge: 2019-09-26 | Disposition: A | Discharge status: Home / Self Care | Payer: Medicare Other | Attending: Gastroenterology | Admitting: Gastroenterology

## 2019-09-26 ENCOUNTER — Encounter
Admission: RE | Disposition: A | Discharge status: Home / Self Care | Payer: Self-pay | Source: Home / Self Care | Attending: Gastroenterology

## 2019-09-26 DIAGNOSIS — D122 Benign neoplasm of ascending colon: Secondary | ICD-10-CM | POA: Diagnosis not present

## 2019-09-26 DIAGNOSIS — Z79811 Long term (current) use of aromatase inhibitors: Secondary | ICD-10-CM | POA: Diagnosis not present

## 2019-09-26 DIAGNOSIS — Z86010 Personal history of colonic polyps: Secondary | ICD-10-CM | POA: Diagnosis not present

## 2019-09-26 DIAGNOSIS — K5730 Diverticulosis of large intestine without perforation or abscess without bleeding: Secondary | ICD-10-CM | POA: Insufficient documentation

## 2019-09-26 DIAGNOSIS — E063 Autoimmune thyroiditis: Secondary | ICD-10-CM | POA: Insufficient documentation

## 2019-09-26 DIAGNOSIS — C50911 Malignant neoplasm of unspecified site of right female breast: Secondary | ICD-10-CM | POA: Diagnosis not present

## 2019-09-26 DIAGNOSIS — K641 Second degree hemorrhoids: Secondary | ICD-10-CM | POA: Insufficient documentation

## 2019-09-26 DIAGNOSIS — Z1211 Encounter for screening for malignant neoplasm of colon: Secondary | ICD-10-CM | POA: Insufficient documentation

## 2019-09-26 DIAGNOSIS — Z79890 Hormone replacement therapy (postmenopausal): Secondary | ICD-10-CM | POA: Diagnosis not present

## 2019-09-26 DIAGNOSIS — E785 Hyperlipidemia, unspecified: Secondary | ICD-10-CM | POA: Diagnosis not present

## 2019-09-26 DIAGNOSIS — K635 Polyp of colon: Secondary | ICD-10-CM | POA: Diagnosis not present

## 2019-09-26 DIAGNOSIS — Z79899 Other long term (current) drug therapy: Secondary | ICD-10-CM | POA: Diagnosis not present

## 2019-09-26 HISTORY — PX: POLYPECTOMY: SHX5525

## 2019-09-26 HISTORY — PX: COLONOSCOPY WITH PROPOFOL: SHX5780

## 2019-09-26 SURGERY — COLONOSCOPY WITH PROPOFOL
Anesthesia: General | Site: Rectum

## 2019-09-26 MED ORDER — LACTATED RINGERS IV SOLN: INTRAVENOUS | Status: DC

## 2019-09-26 MED ORDER — LIDOCAINE HCL (CARDIAC) PF 100 MG/5ML IV SOSY
PREFILLED_SYRINGE | INTRAVENOUS | Status: DC | PRN
  Administered 2019-09-26: 30 mg via INTRAVENOUS

## 2019-09-26 MED ORDER — STERILE WATER FOR IRRIGATION IR SOLN
Status: DC | PRN
  Administered 2019-09-26: 50 mL

## 2019-09-26 MED ORDER — PROPOFOL 10 MG/ML IV BOLUS
INTRAVENOUS | Status: DC | PRN
  Administered 2019-09-26 (×2): 30 mg via INTRAVENOUS
  Administered 2019-09-26: 40 mg via INTRAVENOUS
  Administered 2019-09-26: 150 mg via INTRAVENOUS
  Administered 2019-09-26 (×2): 40 mg via INTRAVENOUS
  Administered 2019-09-26 (×2): 30 mg via INTRAVENOUS

## 2019-09-26 SURGICAL SUPPLY — 6 items
GOWN CVR UNV OPN BCK APRN NK (MISCELLANEOUS) ×2 IMPLANT
GOWN ISOL THUMB LOOP REG UNIV (MISCELLANEOUS) ×2
KIT ENDO PROCEDURE OLY (KITS) ×2 IMPLANT
SNARE SHORT THROW 13M SML OVAL (MISCELLANEOUS) ×1 IMPLANT
TRAP ETRAP POLY (MISCELLANEOUS) ×1 IMPLANT
WATER STERILE IRR 250ML POUR (IV SOLUTION) ×2 IMPLANT

## 2019-09-26 NOTE — Transfer of Care (Signed)
Immediate Anesthesia Transfer of Care Note  Patient: Tiffany Chang  Procedure(s) Performed: COLONOSCOPY WITH BIOPSY (N/A Rectum) POLYPECTOMY (N/A Rectum)  Patient Location: PACU  Anesthesia Type: General  Level of Consciousness: awake, alert  and patient cooperative  Airway and Oxygen Therapy: Patient Spontanous Breathing and Patient connected to supplemental oxygen  Post-op Assessment: Post-op Vital signs reviewed, Patient's Cardiovascular Status Stable, Respiratory Function Stable, Patent Airway and No signs of Nausea or vomiting  Post-op Vital Signs: Reviewed and stable  Complications: No apparent anesthesia complications

## 2019-09-26 NOTE — Op Note (Signed)
[facility] Gastroenterology Patient Name: Tiffany Chang Procedure Date: 09/26/2019 8:48 AM MRN: [id number] Account #: [id number] Date of Birth: Sep 10, 1945 Admit Type: Outpatient Age: 74 Room: [facility] OR ROOM 01 Gender: Female Note Status: Finalized Procedure:             Colonoscopy Indications:           High risk colon cancer surveillance: Personal history                         of colonic polyps Providers:             Lucilla Lame MD, MD Referring MD:          Deborra Medina, MD (Referring MD) Medicines:             Propofol per Anesthesia Complications:         No immediate complications. Procedure:             Pre-Anesthesia Assessment:                        - Prior to the procedure, a History and Physical was                         performed, and patient medications and allergies were                         reviewed. The patient's tolerance of previous                         anesthesia was also reviewed. The risks and benefits                         of the procedure and the sedation options and risks                         were discussed with the patient. All questions were                         answered, and informed consent was obtained. Prior                         Anticoagulants: The patient has taken no previous                         anticoagulant or antiplatelet agents. ASA Grade                         Assessment: II - A patient with mild systemic disease.                         After reviewing the risks and benefits, the patient                         was deemed in satisfactory condition to undergo the                         procedure.  After obtaining informed consent, the colonoscope was                         passed under direct vision. Throughout the procedure,                         the patient's blood pressure, pulse, and oxygen                         saturations were monitored continuously. The was          introduced through the anus and advanced to the the                         cecum, identified by appendiceal orifice and ileocecal                         valve. The colonoscopy was performed without                         difficulty. The patient tolerated the procedure well.                         The quality of the bowel preparation was excellent. Findings:      The perianal and digital rectal examinations were normal.      Two sessile polyps were found in the transverse colon. The polyps were 2       to 4 mm in size. These polyps were removed with a cold snare. Resection       and retrieval were complete.      Many small-mouthed diverticula were found in the entire colon.      Non-bleeding internal hemorrhoids were found during retroflexion. The       hemorrhoids were Grade II (internal hemorrhoids that prolapse but reduce       spontaneously). Impression:            - Two 2 to 4 mm polyps in the transverse colon,                         removed with a cold snare. Resected and retrieved.                        - Diverticulosis in the entire examined colon.                        - Non-bleeding internal hemorrhoids. Recommendation:        - Discharge patient to home.                        - Resume previous diet.                        - Continue present medications.                        - Await pathology results.                        - Repeat colonoscopy in 5 years for surveillance. Procedure Code(s):     --- Professional ---  45385, Colonoscopy, flexible; with removal of                         tumor(s), polyp(s), or other lesion(s) by snare                         technique Diagnosis Code(s):     --- Professional ---                        Z86.010, Personal history of colonic polyps                        K63.5, Polyp of colon CPT copyright 2019 American Medical Association. All rights reserved. The codes documented in this report are preliminary  and upon coder review may  be revised to meet current compliance requirements. Lucilla Lame MD, MD 09/26/2019 9:15:48 AM This report has been signed electronically. Number of Addenda: 0 Note Initiated On: 09/26/2019 8:48 AM Scope Withdrawal Time: 0 hours 9 minutes 57 seconds  Total Procedure Duration: 0 hours 14 minutes 43 seconds  Estimated Blood Loss:  Estimated blood loss: none.      [facility]

## 2019-09-26 NOTE — Anesthesia Postprocedure Evaluation (Signed)
Anesthesia Post Note  Patient: Tiffany Chang  Procedure(s) Performed: COLONOSCOPY WITH BIOPSY (N/A Rectum) POLYPECTOMY (N/A Rectum)     Patient location during evaluation: PACU Anesthesia Type: General Level of consciousness: awake and alert Pain management: pain level controlled Vital Signs Assessment: post-procedure vital signs reviewed and stable Respiratory status: spontaneous breathing Cardiovascular status: stable Anesthetic complications: no    Jaci Standard, III,  Galileo Colello D

## 2019-09-26 NOTE — Anesthesia Procedure Notes (Signed)
Performed by: Monte Bronder, CRNA Pre-anesthesia Checklist: Patient identified, Emergency Drugs available, Suction available, Timeout performed and Patient being monitored Patient Re-evaluated:Patient Re-evaluated prior to induction Oxygen Delivery Method: Nasal cannula Placement Confirmation: positive ETCO2       

## 2019-09-26 NOTE — H&P (Signed)
Lucilla Lame, MD [facility] [address] [place], [place] [address] [phone] Fax : [phone]  Primary Care Physician:  Crecencio Mc, MD Primary Gastroenterologist:  Dr. Allen Norris  Pre-Procedure History & Physical: HPI:  Tiffany Chang is a 74 y.o. female is here for an colonoscopy.   Past Medical History:  Diagnosis Date  . Breast cancer ([place]) 2016   [place] - RT MASTECTOMY  . Hashimoto's thyroiditis   . History of arthroscopy of left knee 1985  . Hyperlipidemia    mild, with prior statin intolerance  . Hypothyroid   . Osteoporosis    prior Evista use x 5 yrs , 2007  . Spinal stenosis    mild  . Syncope, vasovagal    3 episodes  . Tremor   . Viral meningitis    74 years old    Past Surgical History:  Procedure Laterality Date  . CATARACT EXTRACTION W/PHACO Left 09/30/2017   Procedure: CATARACT EXTRACTION PHACO AND INTRAOCULAR LENS PLACEMENT (IOC);  Surgeon: Birder Robson, MD;  Location: [facility] ORS;  Service: Ophthalmology;  Laterality: Left;  [place] 00:40 AP% 15.7 CDE 6.38 Fluid pack lot # [id number]  . CATARACT EXTRACTION W/PHACO Right 06/07/2019   Procedure: CATARACT EXTRACTION PHACO AND INTRAOCULAR LENS PLACEMENT (IOC) RIGHT 4.62 00:33.5;  Surgeon: Birder Robson, MD;  Location: [place];  Service: Ophthalmology;  Laterality: Right;  . CESAREAN SECTION    . CHOLECYSTECTOMY  2006  . FOOT SURGERY     plantar fascitis, Dr. Elvina Mattes  . KNEE ARTHROSCOPY  1985   right  . MASTECTOMY Right 2016   [facility]  . SENTINEL NODE BIOPSY Right 10/10/2014   Procedure: SENTINEL NODE BIOPSY;  Surgeon: Leonie Green, MD;  Location: [facility] ORS;  Service: General;  Laterality: Right;  . SIMPLE MASTECTOMY WITH AXILLARY SENTINEL NODE BIOPSY Right 10/10/2014   Procedure: SIMPLE MASTECTOMY;  Surgeon: Leonie Green, MD;  Location: [facility] ORS;  Service: General;  Laterality: Right;  . TONSILLECTOMY      Prior to Admission medications   Medication Sig Start Date End  Date Taking? Authorizing Provider  Cholecalciferol (VITAMIN D3) 50 MCG (2000 UT) TABS Take by mouth daily.   Yes [provider]  letrozole (FEMARA) 2.5 MG tablet TAKE 1 TABLET BY MOUTH EVERY DAY 05/05/19  Yes Lloyd Huger, MD  levothyroxine (SYNTHROID) 88 MCG tablet TAKE 1 TABLET BY MOUTH EVERY DAY 07/28/19  Yes Crecencio Mc, MD  naproxen sodium (ALEVE) 220 MG tablet Take 220 mg by mouth 2 (two) times daily as needed (pain).   Yes [provider]  primidone (MYSOLINE) 50 MG tablet Take 3 tabs in the morning and 2 tabs in the evening 09/22/19  Yes Tat, Eustace Quail, DO  propranolol (INDERAL) 40 MG tablet TAKE 1 TABLET BY MOUTH TWICE A DAY 08/12/19  Yes Tat, Eustace Quail, DO  rosuvastatin (CRESTOR) 5 MG tablet TAKE 1 TABLET BY MOUTH EVERY DAY Patient not taking: Reported on 09/19/2019 02/08/19   Crecencio Mc, MD    Allergies as of 08/11/2019 - Review Complete 08/11/2019  Allergen Reaction Noted  . Statins Other (See Comments) 04/02/2011  . Bee venom Hives 10/03/2014    Family History  Problem Relation Age of Onset  . Osteoporosis Mother   . Aortic aneurysm Father   . Cancer Sister 79       Breast  . Breast cancer Sister 74  . Cancer Maternal Grandfather        breast  .  Breast cancer Maternal Grandmother 86    Social History   Socioeconomic History  . Marital status: Married    Spouse name: Not on file  . Number of children: Not on file  . Years of education: Not on file  . Highest education level: Not on file  Occupational History    Employer: the [employer]    Comment: exercise physiologist  Tobacco Use  . Smoking status: Never Smoker  . Smokeless tobacco: Never Used  Substance and Sexual Activity  . Alcohol use: Yes    Alcohol/week: 3.0 standard drinks    Types: 3 Standard drinks or equivalent per week    Comment: social, 2 times per week  . Drug use: No  . Sexual activity: Not Currently  Other Topics Concern  . Not on file    Social History Narrative   Right handed   2 grown children   3 story home   Social Determinants of Health   Financial Resource Strain: Low Risk   . Difficulty of Paying Living Expenses: Not hard at all  Food Insecurity:   . Worried About Charity fundraiser in the Last Year:   . Arboriculturist in the Last Year:   Transportation Needs:   . Film/video editor (Medical):   Marland Kitchen Lack of Transportation (Non-Medical):   Physical Activity: Sufficiently Active  . Days of Exercise per Week: 5 days  . Minutes of Exercise per Session: 50 min  Stress: No Stress Concern Present  . Feeling of Stress : Not at all  Social Connections:   . Frequency of Communication with Friends and Family:   . Frequency of Social Gatherings with Friends and Family:   . Attends Religious Services:   . Active Member of Clubs or Organizations:   . Attends Archivist Meetings:   Marland Kitchen Marital Status:   Intimate Partner Violence:   . Fear of Current or Ex-Partner:   . Emotionally Abused:   Marland Kitchen Physically Abused:   . Sexually Abused:     Review of Systems: See HPI, otherwise negative ROS  Physical Exam: BP 113/60   Pulse 78   Temp 97.7 F (36.5 C) (Temporal)   Resp 16   Ht 5\' 8"  (1.727 m)   Wt 72.1 kg   SpO2 100%   BMI 24.18 kg/m  General:   Alert,  pleasant and cooperative in NAD Head:  Normocephalic and atraumatic. Neck:  Supple; no masses or thyromegaly. Lungs:  Clear throughout to auscultation.    Heart:  Regular rate and rhythm. Abdomen:  Soft, nontender and nondistended. Normal bowel sounds, without guarding, and without rebound.   Neurologic:  Alert and  oriented x4;  grossly normal neurologically.  Impression/Plan: Tiffany Chang is here for an colonoscopy to be performed for history of adenomatous polyps in 2014  Risks, benefits, limitations, and alternatives regarding  colonoscopy have been reviewed with the patient.  Questions have been answered.  All parties  agreeable.   Lucilla Lame, MD  09/26/2019, 8:24 AM

## 2019-09-26 NOTE — Anesthesia Preprocedure Evaluation (Signed)
Anesthesia Evaluation  Patient identified by MRN, date of birth, ID band Patient awake    Reviewed: Allergy & Precautions, H&P , NPO status , Patient's Chart, lab work & pertinent test results  History of Anesthesia Complications Negative for: history of anesthetic complications  Airway Mallampati: II  TM Distance: >3 FB Neck ROM: full    Dental no notable dental hx.    Pulmonary neg pulmonary ROS,    Pulmonary exam normal        Cardiovascular negative cardio ROS Normal cardiovascular exam Rhythm:regular Rate:Normal     Neuro/Psych negative neurological ROS     GI/Hepatic negative GI ROS, Neg liver ROS,   Endo/Other  Hypothyroidism   Renal/GU negative Renal ROS  negative genitourinary   Musculoskeletal   Abdominal   Peds  Hematology negative hematology ROS (+)   Anesthesia Other Findings   Reproductive/Obstetrics negative OB ROS                             Anesthesia Physical Anesthesia Plan  ASA: II  Anesthesia Plan: General   Post-op Pain Management:    Induction:   PONV Risk Score and Plan: Propofol infusion  Airway Management Planned:   Additional Equipment:   Intra-op Plan:   Post-operative Plan:   Informed Consent: I have reviewed the patients History and Physical, chart, labs and discussed the procedure including the risks, benefits and alternatives for the proposed anesthesia with the patient or authorized representative who has indicated his/her understanding and acceptance.       Plan Discussed with:   Anesthesia Plan Comments:         Anesthesia Quick Evaluation

## 2019-09-27 ENCOUNTER — Encounter: Payer: Self-pay | Admitting: Gastroenterology

## 2019-09-27 ENCOUNTER — Encounter: Payer: Self-pay | Admitting: *Deleted

## 2019-09-27 LAB — SURGICAL PATHOLOGY

## 2019-09-27 NOTE — Progress Notes (Signed)
Virtual Visit Via Video   The purpose of this virtual visit is to provide medical care while limiting exposure to the novel coronavirus.    Consent was obtained for video visit:  Yes.   Answered questions that patient had about telehealth interaction:  Yes.   I discussed the limitations, risks, security and privacy concerns of performing an evaluation and management service by telemedicine. I also discussed with the patient that there may be a patient responsible charge related to this service. The patient expressed understanding and agreed to proceed.  Pt location: Home Physician Location: office Name of referring provider:  Crecencio Mc, MD I connected with Liliane Channel at patients initiation/request on 09/29/2019 at  3:30 PM EDT by video enabled telemedicine application and verified that I am speaking with the correct person using two identifiers. Pt MRN:  [id number] Pt DOB:  April 22, 1946 Video Participants:  Liliane Channel;  Husband participates with conversation  Assessment/Plan:    1.  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  Discussed potentially increasing it, but ultimately decided not to right now.  She may want to do that in the future.  -Discussed DBS today as well as at other prior visits.  Discussed this in very significant detail.  Discussed focused u/s as well as its risks and benefits.  She does not think she is ready for surgical intervention right now.  -asks about gene therapy for ET.  Discussed with her that I do not know anybody doing this type of thing.  -Second opinion offered at [facility].  They will think about this.  Subjective:   Tiffany Chang was seen today in follow up for essential tremor.  My previous records were reviewed prior to todays visit.  She did message me since last visit and we have increased her primidone.  She reports that she is doing  better with the increase.  She sometimes has to eat with a bib.   Has questions about tremor and dbs. Patient saw her primary care physician on March 24.  I have reviewed those records.  Current prescribed movement disorder medications: propranolol, 40 mg twice per day Primidone, 50 mg, 3 tablets in the morning and 2 tablets at night  ALLERGIES:   Allergies  Allergen Reactions  . Statins Other (See Comments)     Leg cramps in high doses  . Bee Venom Hives    CURRENT MEDICATIONS:  Outpatient Encounter Medications as of 09/29/2019  Medication Sig  . Cholecalciferol (VITAMIN D3) 50 MCG (2000 UT) TABS Take 1 tablet by mouth daily.   Marland Kitchen letrozole (FEMARA) 2.5 MG tablet TAKE 1 TABLET BY MOUTH EVERY DAY  . levothyroxine (SYNTHROID) 88 MCG tablet TAKE 1 TABLET BY MOUTH EVERY DAY  . naproxen sodium (ALEVE) 220 MG tablet Take 220 mg by mouth 2 (two) times daily as needed (pain).  . primidone (MYSOLINE) 50 MG tablet Take 3 tabs in the morning and 2 tabs in the evening  . propranolol (INDERAL) 40 MG tablet TAKE 1 TABLET BY MOUTH TWICE A DAY  . rosuvastatin (CRESTOR) 5 MG tablet TAKE 1 TABLET BY MOUTH EVERY DAY (Patient not taking: Reported on 09/19/2019)   No facility-administered encounter medications on file as of 09/29/2019.     Objective:    PHYSICAL EXAMINATION:    VITALS:   Vitals:   09/29/19 [id number]  Weight: 159 lb (72.1 kg)  Height: 5' 7.5" (1.715 m)    GEN:  The patient appears stated age and is in NAD. HEENT:  Normocephalic, atraumatic.    Neurological examination:  Orientation: The patient is alert and oriented x3. Cranial nerves: There is good facial symmetry. The speech is fluent and clear.  Hearing is intact to conversational tone.  Movement examination: Tone: There is normal tone in the UE/LE Abnormal movements: There is moderate essential tremor seen bilaterally today.  I have reviewed and interpreted the following labs independently   Chemistry      Component  Value Date/Time   NA 139 08/10/2019 0857   K 4.8 08/10/2019 0857   CL 103 08/10/2019 0857   CO2 31 08/10/2019 0857   BUN 15 08/10/2019 0857   CREATININE 0.66 08/10/2019 0857      Component Value Date/Time   CALCIUM 9.5 08/10/2019 0857   ALKPHOS 82 08/10/2019 0857   AST 15 08/10/2019 0857   ALT 15 08/10/2019 0857   BILITOT 0.3 08/10/2019 0857     Lab Results  Component Value Date   TSH 1.54 08/10/2019      Total time spent on today's visit was 40 minutes, including both face-to-face time and nonface-to-face time.  Time included that spent on review of records (prior notes available to me/labs/imaging if pertinent), discussing treatment and goals, answering patient's questions and coordinating care.  Cc:  Crecencio Mc, MD/

## 2019-09-29 ENCOUNTER — Other Ambulatory Visit: Payer: Self-pay

## 2019-09-29 ENCOUNTER — Encounter: Payer: Self-pay | Admitting: Neurology

## 2019-09-29 ENCOUNTER — Telehealth (INDEPENDENT_AMBULATORY_CARE_PROVIDER_SITE_OTHER): Payer: Medicare Other | Admitting: Neurology

## 2019-09-29 VITALS — Ht 67.5 in | Wt 159.0 lb

## 2019-09-29 DIAGNOSIS — G250 Essential tremor: Secondary | ICD-10-CM

## 2019-10-25 ENCOUNTER — Other Ambulatory Visit: Payer: Self-pay | Admitting: Internal Medicine

## 2019-10-31 NOTE — Progress Notes (Signed)
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  INTERVAL HISTORY: Patient returns to clinic today for routine 72-monthevaluation. She continues to feel well and remains asymptomatic. She is tolerating letrozole only with occasional hot flashes but do not affect her day-to-day activity.  She has no neurologic complaints.  She denies any recent fevers or illnesses.  She has a good appetite and denies weight loss.  She denies any chest pain, shortness of breath, cough, or hemoptysis.  She denies any nausea, vomiting, constipation, or diarrhea. She has no urinary complaints. Patient feels at her baseline offers no specific complaints today.  REVIEW OF SYSTEMS:   Review of Systems  Constitutional: Negative.  Negative for fever, malaise/fatigue and weight loss.  Respiratory: Negative.  Negative for cough and shortness of breath.   Cardiovascular: Negative.  Negative for chest pain and leg swelling.  Gastrointestinal: Negative.  Negative for abdominal pain.  Genitourinary: Negative.  Negative for dysuria.  Musculoskeletal: Negative.  Negative for back pain.  Skin: Negative.  Negative for rash.  Neurological: Negative.  Negative for sensory change, focal weakness, weakness and headaches.  Psychiatric/Behavioral: Negative.  The patient is not nervous/anxious.     As per HPI. Otherwise, a complete review of systems is negative.  PAST MEDICAL HISTORY: Past Medical History:  Diagnosis Date  . [facility]   [facility]  . Hashimoto's thyroiditis   . History of arthroscopy of left knee 1985  . Hyperlipidemia     mild, with prior statin intolerance  . Hypothyroid   . Osteoporosis    prior Evista use x 5 yrs , 2007  . Spinal stenosis    mild  . Syncope, vasovagal    3 episodes  . Tremor   . Viral meningitis    74years old    PAST SURGICAL HISTORY: Past Surgical History:  Procedure Laterality Date  . CATARACT EXTRACTION W/PHACO Left 09/30/2017   Procedure: CATARACT EXTRACTION PHACO AND INTRAOCULAR LENS PLACEMENT (IOC);  Surgeon: PBirder Robson MD;  Location: [facility];  Service: Ophthalmology;  Laterality: Left;  [place]:40 AP% 15.7 CDE 6.38 Fluid pack lot # [id number]  . CATARACT EXTRACTION W/PHACO Right 06/07/2019   Procedure: CATARACT EXTRACTION PHACO AND INTRAOCULAR LENS PLACEMENT (IOC) RIGHT 4.62 00:33.5;  Surgeon: PBirder Robson MD;  [place]  Service: Ophthalmology;  Laterality: Right;  . CESAREAN SECTION    . CHOLECYSTECTOMY  2006  . COLONOSCOPY WITH PROPOFOL N/A 09/26/2019   Procedure: COLONOSCOPY WITH BIOPSY;  Surgeon: WLucilla Lame MD;  [place]  Service: Endoscopy;  Laterality: N/A;  priority 3  . FOOT SURGERY     plantar fascitis, Dr. TElvina Mattes . KNEE ARTHROSCOPY  1985   right  . MASTECTOMY Right 2016   [facility]  . POLYPECTOMY N/A 09/26/2019   Procedure: POLYPECTOMY;  Surgeon: WLucilla Lame MD;  [place]  Service: Endoscopy;  Laterality: N/A;  . SENTINEL NODE BIOPSY Right 10/10/2014   Procedure: SENTINEL NODE BIOPSY;  Surgeon: JLeonie Green MD;  Location: [facility];  Service: General;  Laterality: Right;  . SIMPLE MASTECTOMY WITH AXILLARY SENTINEL NODE BIOPSY Right 10/10/2014  Procedure: SIMPLE MASTECTOMY;  Surgeon: Leonie Green, MD;  Location: [facility];  Service: General;  Laterality: Right;  . TONSILLECTOMY      FAMILY HISTORY Family History  Problem Relation Age of Onset  . Osteoporosis Mother   . Aortic aneurysm Father   . Cancer Sister 36       Breast  . Breast cancer Sister 55  . Cancer  Maternal Grandfather        breast  . Breast cancer Maternal Grandmother 86       ADVANCED DIRECTIVES:    HEALTH MAINTENANCE: Social History   Tobacco Use  . Smoking status: Never Smoker  . Smokeless tobacco: Never Used  Vaping Use  . Vaping Use: Never used  Substance Use Topics  . Alcohol use: Yes    Alcohol/week: 3.0 standard drinks    Types: 3 Standard drinks or equivalent per week    Comment: social, 2 times per week  . Drug use: No     Colonoscopy:  PAP:  Bone density:  Lipid panel:  Allergies  Allergen Reactions  . Statins Other (See Comments)     Leg cramps in high doses  . Bee Venom Hives    Current Outpatient Medications  Medication Sig Dispense Refill  . Cholecalciferol (VITAMIN D3) 50 MCG (2000 UT) TABS Take 1 tablet by mouth daily.     Marland Kitchen letrozole (FEMARA) 2.5 MG tablet TAKE 1 TABLET BY MOUTH EVERY DAY 90 tablet 1  . levothyroxine (SYNTHROID) 88 MCG tablet TAKE 1 TABLET BY MOUTH EVERY DAY 90 tablet 0  . naproxen sodium (ALEVE) 220 MG tablet Take 220 mg by mouth 2 (two) times daily as needed (pain).    . primidone (MYSOLINE) 50 MG tablet Take 3 tabs in the morning and 2 tabs in the evening 450 tablet 0  . propranolol (INDERAL) 40 MG tablet TAKE 1 TABLET BY MOUTH TWICE A DAY 180 tablet 1  . rosuvastatin (CRESTOR) 5 MG tablet TAKE 1 TABLET BY MOUTH EVERY DAY (Patient not taking: Reported on 09/19/2019) 90 tablet 1   No current facility-administered medications for this visit.    OBJECTIVE: There were no vitals filed for this visit.   There is no height or weight on file to calculate BMI.    ECOG FS:0 - Asymptomatic  General: Well-developed, well-nourished, no acute distress. Eyes: Pink conjunctiva, anicteric sclera. HEENT: Normocephalic, moist mucous membranes. Breast: Right mastectomy, exam deferred today. Lungs: No audible wheezing or coughing. Heart: Regular rate and rhythm. Abdomen: Soft, nontender, no obvious distention. Musculoskeletal: No  edema, cyanosis, or clubbing. Neuro: Alert, answering all questions appropriately. Cranial nerves grossly intact. Skin: No rashes or petechiae noted. Psych: Normal affect.   LAB RESULTS:  Lab Results  Component Value Date   NA 139 08/10/2019   K 4.8 08/10/2019   CL 103 08/10/2019   CO2 31 08/10/2019   GLUCOSE 96 08/10/2019   BUN 15 08/10/2019   CREATININE 0.66 08/10/2019   CALCIUM 9.5 08/10/2019   PROT 7.1 08/10/2019   ALBUMIN 4.4 08/10/2019   AST 15 08/10/2019   ALT 15 08/10/2019   ALKPHOS 82 08/10/2019   BILITOT 0.3 08/10/2019   GFRNONAA >60 10/03/2014   GFRAA >60 10/03/2014    Lab Results  Component Value Date   WBC 6.2 08/10/2019   NEUTROABS 4.1 08/10/2019   HGB 14.2 08/10/2019   HCT 42.1 08/10/2019   MCV 88.8 08/10/2019   PLT 320.0 08/10/2019     STUDIES: DG Bone Density  Result Date: 11/02/2019 EXAM: DUAL X-RAY ABSORPTIOMETRY (DXA) FOR BONE MINERAL DENSITY IMPRESSION: Dear Dr Grayland Ormond, Your patient MYELLE POTEAT completed a FRAX assessment on 11/02/2019 using the [place] (analysis version: 14.10) manufactured by [employer]. The following summarizes the results of our evaluation. PATIENT BIOGRAPHICAL: Name: Charlisa, Cham Patient ID: [id number] Birth Date: [birth year] Height:    67.5 in. Gender:     Female    [age]       Weight:    159.0 lbs. Ethnicity:  White                            Exam Date: 11/02/2019 FRAX* RESULTS:  (version: 3.5) 10-year Probability of Fracture1 Major Osteoporotic Fracture2 Hip Fracture 10.8% 1.9% Population: [place] (Caucasian) Risk Factors: None Based on Femur (Right) Neck BMD 1 -The 10-year probability of fracture may be lower than reported if the patient has received treatment. 2 -Major Osteoporotic Fracture: Clinical Spine, Forearm, Hip or Shoulder *FRAX is a Materials engineer of the [employer] for Metabolic Bone Disease, a [place] (WHO) [facility]. ASSESSMENT: The  probability of a major osteoporotic fracture is 10.8% within the next ten years. The probability of a hip fracture is 1.9% within the next ten years. . Your patient Chenelle Benning completed a BMD test on 11/02/2019 using the [place] (software version: 14.10) manufactured by [employer]. The following summarizes the results of our evaluation. Technologist: [employer] [facility] PATIENT BIOGRAPHICAL: Name: Gustie, Bobb Patient ID: [id number] Birth Date: 1945-08-23 Height: 67.5 in. Gender: Female Exam Date: 11/02/2019 Weight: 159.0 lbs. Indications: Height Loss, Postmenopausal, Hypothyroid, Advanced Age, Osteopenia, Vitamin D Deficiency, History of Breast Cancer, High Risk Meds, Caucasian, Family Hx of Osteoporosis Fractures: Treatments: Calcium, Letrozole, Synthroid, Vit D DENSITOMETRY RESULTS: Site      Region     Measured Date Measured Age WHO Classification Young Adult T-score BMD         %Change vs. Previous Significant Change (*) AP Spine L1-L3 11/02/2019 71.7 Osteopenia -1.4 1.012 g/cm2 -2.3% - AP Spine L1-L3 11/01/2018 70.7 Osteopenia -1.2 1.036 g/cm2 -0.5% - AP Spine L1-L3 10/26/2017 69.7 Osteopenia -1.1 1.041 g/cm2 0.3% - AP Spine L1-L3 10/23/2016 68.7 Osteopenia -1.2 1.038 g/cm2 -5.6% Yes AP Spine L1-L3 10/23/2015 67.7 Normal -0.7 1.099 g/cm2 -0.2% - AP Spine L1-L3 10/31/2014 66.7 Normal -0.7 1.101 g/cm2 -0.5% - AP Spine L1-L3 08/31/2013 65.5 Normal -0.6 1.107 g/cm2 0.2% - AP Spine L1-L3 02/27/2010 62.0 Normal -0.6 1.105 g/cm2 - - DualFemur Neck Right 11/02/2019 71.7 Osteopenia -1.6 0.816 g/cm2 6.7% - DualFemur Neck Right 11/01/2018 70.7 Osteopenia -2.0 0.765 g/cm2 -0.5% - DualFemur Neck Right 10/26/2017 69.7 Osteopenia -1.9 0.769 g/cm2 -0.6% - DualFemur Neck Right 10/23/2016 68.7 Osteopenia -1.9 0.774 g/cm2 -4.1% - DualFemur Neck Right 10/23/2015 67.7 Osteopenia -1.7 0.807 g/cm2 0.2% - DualFemur Neck Right 10/31/2014 66.7 Osteopenia -1.7 0.805 g/cm2 1.5% - DualFemur Neck Right 02/27/2010 62.0 Osteopenia -1.8  0.793 g/cm2 - - DualFemur Total Mean 11/02/2019 71.7 Osteopenia -1.2 0.853 g/cm2 0.8% - DualFemur Total Mean 11/01/2018 70.7 Osteopenia -1.3 0.846 g/cm2 -0.5% - DualFemur Total Mean 10/26/2017 69.7 Osteopenia -1.3 0.850 g/cm2 -1.5% - DualFemur Total Mean 10/23/2016 68.7 Osteopenia -1.1 0.863 g/cm2 -2.6% - DualFemur Total Mean 10/23/2015 67.7 Normal -1.0 0.886 g/cm2 -0.1% - DualFemur Total Mean 10/31/2014 66.7 Normal -1.0 0.887 g/cm2 0.6% - DualFemur Total Mean 02/27/2010 62.0 Normal -  1.0 0.882 g/cm2 - - ASSESSMENT: The BMD measured at Femur Neck Right is 0.816 g/cm2 with a T-score of -1.6. This patient is considered osteopenic according to [facility]) criteria. The scan quality is good. L-4 was excluded due to degenerative changes. Compared with prior study, there has been no significant change in the spine. Compared with prior study, there has been no significant change in the total hip. World Pharmacologist [facility]) criteria for post-menopausal, Caucasian Women: Normal:                   T-score at or above -1 SD Osteopenia/low bone mass: T-score between -1 and -2.5 SD Osteoporosis:             T-score at or below -2.5 SD RECOMMENDATIONS: 1. All patients should optimize calcium and vitamin D intake. 2. Consider FDA-approved medical therapies in postmenopausal women and men aged 30 years and older, based on the following: a. A hip or vertebral(clinical or morphometric) fracture b. T-score < -2.5 at the femoral neck or spine after appropriate evaluation to exclude secondary causes c. Low bone mass (T-score between -1.0 and -2.5 at the femoral neck or spine) and a 10-year probability of a hip fracture > 3% or a 10-year probability of a major osteoporosis-related fracture > 20% based on the [place]-adapted WHO algorithm 3. Clinician judgment and/or patient preferences may indicate treatment for people with 10-year fracture probabilities above or below these levels FOLLOW-UP: People with diagnosed  cases of osteoporosis or at high risk for fracture should have regular bone mineral density tests. For patients eligible for Medicare, routine testing is allowed once every 2 years. The testing frequency can be increased to one year for patients who have rapidly progressing disease, those who are receiving or discontinuing medical therapy to restore bone mass, or have additional risk factors. I have reviewed this report, and agree with the above findings. [facility] Electronically Signed   By: Lowella Grip III M.D.   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  PLAN:    1. 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 Given the fact that her malignancy is lobular in nature, she did not require chemotherapy. After lengthy discussion of the patient, patient would like to continue letrozole for one additional year for a total of six completing treatment in June 2022. Mammogram from this morning is pending at time of dictation. Return to clinic in 6 months with video assisted telemedicine visit.  2. Osteopenia: Patient's most recent bone mineral density from November 02, 2019 reported T score of -1.6 which is improved from 1 year prior. Continue calcium and vitamin D supplementation. Repeat in June 2022 along with her mammogram.  3. Left breast abnormality: Patient had breast biopsy on August 21, 2015 that was negative for malignancy. Continue mammograms as above.     Patient expressed understanding and was in agreement with this plan. She also understands that She can call clinic at any time with any questions, concerns, or complaints.    Lloyd Huger, MD   11/02/2019 12:11 PM

## 2019-11-01 ENCOUNTER — Encounter: Payer: Self-pay | Admitting: Oncology

## 2019-11-01 NOTE — Progress Notes (Signed)
Patient prescreened for appointment. Pt is wanting to discuss if she needs to continue letrozole.

## 2019-11-02 ENCOUNTER — Ambulatory Visit
Admission: RE | Admit: 2019-11-02 | Discharge: 2019-11-02 | Disposition: A | Discharge status: Home / Self Care | Payer: Medicare Other | Source: Ambulatory Visit | Attending: Oncology | Admitting: Oncology

## 2019-11-02 ENCOUNTER — Other Ambulatory Visit: Payer: Self-pay

## 2019-11-02 ENCOUNTER — Inpatient Hospital Stay: Payer: Medicare Other | Attending: Oncology | Admitting: Oncology

## 2019-11-02 DIAGNOSIS — Z79811 Long term (current) use of aromatase inhibitors: Secondary | ICD-10-CM | POA: Diagnosis not present

## 2019-11-02 DIAGNOSIS — C50411 Malignant neoplasm of upper-outer quadrant of right female breast: Secondary | ICD-10-CM | POA: Diagnosis not present

## 2019-11-02 DIAGNOSIS — Z170 Estrogen receptor positive status [ER+]: Secondary | ICD-10-CM | POA: Diagnosis not present

## 2019-11-02 DIAGNOSIS — M8580 Other specified disorders of bone density and structure, unspecified site: Secondary | ICD-10-CM | POA: Diagnosis not present

## 2019-11-10 ENCOUNTER — Other Ambulatory Visit: Payer: Self-pay | Admitting: Oncology

## 2019-11-10 ENCOUNTER — Other Ambulatory Visit: Payer: Self-pay | Admitting: Neurology

## 2019-11-10 ENCOUNTER — Other Ambulatory Visit: Payer: Self-pay | Admitting: Internal Medicine

## 2019-11-10 NOTE — Telephone Encounter (Signed)
Rx(s) sent to pharmacy electronically.  

## 2020-01-10 DIAGNOSIS — L821 Other seborrheic keratosis: Secondary | ICD-10-CM | POA: Diagnosis not present

## 2020-01-10 DIAGNOSIS — L814 Other melanin hyperpigmentation: Secondary | ICD-10-CM | POA: Diagnosis not present

## 2020-01-12 DIAGNOSIS — H43813 Vitreous degeneration, bilateral: Secondary | ICD-10-CM | POA: Diagnosis not present

## 2020-01-31 DIAGNOSIS — Z23 Encounter for immunization: Secondary | ICD-10-CM | POA: Diagnosis not present

## 2020-02-04 ENCOUNTER — Other Ambulatory Visit: Payer: Self-pay | Admitting: Neurology

## 2020-02-06 NOTE — Telephone Encounter (Signed)
Rx(s) sent to pharmacy electronically.  

## 2020-02-08 ENCOUNTER — Other Ambulatory Visit: Payer: Self-pay | Admitting: Neurology

## 2020-02-08 ENCOUNTER — Other Ambulatory Visit: Payer: Self-pay | Admitting: Internal Medicine

## 2020-02-08 NOTE — Telephone Encounter (Signed)
Pt has an appt in [place]

## 2020-02-10 ENCOUNTER — Ambulatory Visit (INDEPENDENT_AMBULATORY_CARE_PROVIDER_SITE_OTHER): Payer: Medicare Other | Admitting: Internal Medicine

## 2020-02-10 ENCOUNTER — Other Ambulatory Visit: Payer: Self-pay

## 2020-02-10 ENCOUNTER — Encounter: Payer: Self-pay | Admitting: Internal Medicine

## 2020-02-10 VITALS — BP 140/80 | HR 73 | Temp 97.6°F | Ht 67.52 in | Wt 162.2 lb

## 2020-02-10 DIAGNOSIS — Z170 Estrogen receptor positive status [ER+]: Secondary | ICD-10-CM

## 2020-02-10 DIAGNOSIS — Z0184 Encounter for antibody response examination: Secondary | ICD-10-CM | POA: Diagnosis not present

## 2020-02-10 DIAGNOSIS — C50411 Malignant neoplasm of upper-outer quadrant of right female breast: Secondary | ICD-10-CM

## 2020-02-10 DIAGNOSIS — R7303 Prediabetes: Secondary | ICD-10-CM

## 2020-02-10 DIAGNOSIS — E7800 Pure hypercholesterolemia, unspecified: Secondary | ICD-10-CM

## 2020-02-10 DIAGNOSIS — G250 Essential tremor: Secondary | ICD-10-CM

## 2020-02-10 LAB — LIPID PANEL
Cholesterol: 298 mg/dL — ABNORMAL HIGH (ref 0–200)
HDL: 44.6 mg/dL (ref >39.00)
LDL Cholesterol: 220 mg/dL — ABNORMAL HIGH (ref 0–99)
NonHDL: 253.68
Total CHOL/HDL Ratio: 7
Triglycerides: 169 mg/dL — ABNORMAL HIGH (ref 0.0–149.0)
VLDL: 33.8 mg/dL (ref 0.0–40.0)

## 2020-02-10 LAB — COMPREHENSIVE METABOLIC PANEL
ALT: 24 U/L (ref 0–35)
AST: 13 U/L (ref 0–37)
Albumin: 4.6 g/dL (ref 3.5–5.2)
Alkaline Phosphatase: 89 U/L (ref 39–117)
BUN: 13 mg/dL (ref 6–23)
CO2: 31 mEq/L (ref 19–32)
Calcium: 9.6 mg/dL (ref 8.4–10.5)
Chloride: 103 mEq/L (ref 96–112)
Creatinine, Ser: 0.67 mg/dL (ref 0.40–1.20)
GFR: 86.51 mL/min (ref >60.00)
Glucose, Bld: 102 mg/dL — ABNORMAL HIGH (ref 70–99)
Potassium: 4.8 mEq/L (ref 3.5–5.1)
Sodium: 140 mEq/L (ref 135–145)
Total Bilirubin: 0.3 mg/dL (ref 0.2–1.2)
Total Protein: 7.2 g/dL (ref 6.0–8.3)

## 2020-02-10 LAB — HEMOGLOBIN A1C: Hgb A1c MFr Bld: 6 % (ref 4.6–6.5)

## 2020-02-10 MED ORDER — ALPRAZOLAM 0.25 MG PO TABS
0.2500 mg | ORAL_TABLET | Freq: Two times a day (BID) | ORAL | 0 refills | Status: DC | PRN
Start: 2020-02-10 — End: 2022-10-21

## 2020-02-10 NOTE — Patient Instructions (Addendum)
Ask Tat about US guided thalotomy (done by Neurosurgeon at [facility]) for  Essential  tremor   Ok to increase primidone dose weekly by one increment    Try the alprazolam  For special occasions,  starting with 1/2 tablet

## 2020-02-10 NOTE — Progress Notes (Signed)
Subjective:  Patient ID: Tiffany Chang, female    DOB: 04-Mar-1946  Age: 74 y.o. MRN: [id number]  CC: The primary encounter diagnosis was Pure hypercholesterolemia. Diagnoses of Prediabetes, COVID-19 virus IgG antibody test result equivocal, Benign essential tremor, and Malignant neoplasm of upper-outer quadrant of right breast in female, estrogen receptor positive ([place]) were also pertinent to this visit.  HPI Tiffany Chang presents for follow up on BET and hyperlipidemia s BET: her tremor has progressed despite use of propranolol, augmented with primidone last year by Dr. Carles Collet.  She is considering DBS, and has been offered a second opinion at [facility] or [facility].  Her daughter is getting married in April and eating has become more difficult . Tremor is made worse by anxiety/apprehensivon at social outings.  Hyperlipidemia: sent to [facility] for opinion and Cardiac CT  .  Not done.    Wants to resume crestor for LDL goal of 100 .  Fasting today  BRCA: coming up on letrozole length of therapy 5 years.  Wants to stop it bc it is raising her blood sugars.   COVID : WANTS PFIZER BOOSTER  Upcoming 3 week trip in October to [place] and [place] with [facility] group   Outpatient Medications Prior to Visit  Medication Sig Dispense Refill  . Cholecalciferol (VITAMIN D3) 50 MCG (2000 UT) TABS Take 1 tablet by mouth daily.     Marland Kitchen letrozole (FEMARA) 2.5 MG tablet TAKE 1 TABLET BY MOUTH EVERY DAY 90 tablet 3  . levothyroxine (SYNTHROID) 88 MCG tablet TAKE 1 TABLET BY MOUTH EVERY DAY 90 tablet 0  . naproxen sodium (ALEVE) 220 MG tablet Take 220 mg by mouth 2 (two) times daily as needed (pain).    . primidone (MYSOLINE) 50 MG tablet TAKE 3 TABS IN THE MORNING AND 2 TABS IN THE EVENING 300 tablet 0  . propranolol (INDERAL) 40 MG tablet TAKE 1 TABLET BY MOUTH TWICE A DAY 180 tablet 1  . rosuvastatin (CRESTOR) 5 MG tablet TAKE 1 TABLET BY MOUTH EVERY DAY (Patient not taking: Reported on 09/19/2019) 90 tablet 1   No  facility-administered medications prior to visit.    Review of Systems;  Patient denies headache, fevers, malaise, unintentional weight loss, skin rash, eye pain, sinus congestion and sinus pain, sore throat, dysphagia,  hemoptysis , cough, dyspnea, wheezing, chest pain, palpitations, orthopnea, edema, abdominal pain, nausea, melena, diarrhea, constipation, flank pain, dysuria, hematuria, urinary  Frequency, nocturia, numbness, tingling, seizures,  Focal weakness, Loss of consciousness,  Tremor, insomnia, depression, anxiety, and suicidal ideation.      Objective:  BP 140/80 (BP Location: Left Arm, Patient Position: Sitting)   Pulse 73   Temp 97.6 F (36.4 C)   Ht 5' 7.52" (1.715 m)   Wt 162 lb 3.2 oz (73.6 kg)   SpO2 99%   BMI 25.01 kg/m   BP Readings from Last 3 Encounters:  02/10/20 140/80  09/26/19 123/70  08/10/19 (!) 142/96    Wt Readings from Last 3 Encounters:  02/10/20 162 lb 3.2 oz (73.6 kg)  09/29/19 159 lb (72.1 kg)  09/26/19 159 lb (72.1 kg)    General appearance: alert, cooperative and appears stated age Ears: normal TM's and external ear canals both ears Throat: lips, mucosa, and tongue normal; teeth and gums normal Neck: no adenopathy, no carotid bruit, supple, symmetrical, trachea midline and thyroid not enlarged, symmetric, no tenderness/mass/nodules Back: symmetric, no curvature. ROM normal. No CVA tenderness. Lungs: clear to auscultation bilaterally Heart: regular rate  and rhythm, S1, S2 normal, no murmur, click, rub or gallop Abdomen: soft, non-tender; bowel sounds normal; no masses,  no organomegaly Pulses: 2+ and symmetric Skin: Skin color, texture, turgor normal. No rashes or lesions Lymph nodes: Cervical, supraclavicular, and axillary nodes normal.  Lab Results  Component Value Date   HGBA1C 6.0 02/10/2020   HGBA1C 6.0 08/09/2018   HGBA1C 5.9 08/05/2017    Lab Results  Component Value Date   CREATININE 0.67 02/10/2020   CREATININE 0.66  08/10/2019   CREATININE 0.69 08/09/2018    Lab Results  Component Value Date   WBC 6.2 08/10/2019   HGB 14.2 08/10/2019   HCT 42.1 08/10/2019   PLT 320.0 08/10/2019   GLUCOSE 102 (H) 02/10/2020   CHOL 298 (H) 02/10/2020   TRIG 169.0 (H) 02/10/2020   HDL 44.60 02/10/2020   LDLDIRECT 218.0 08/05/2017   LDLCALC 220 (H) 02/10/2020   ALT 24 02/10/2020   AST 13 02/10/2020   NA 140 02/10/2020   K 4.8 02/10/2020   CL 103 02/10/2020   CREATININE 0.67 02/10/2020   BUN 13 02/10/2020   CO2 31 02/10/2020   TSH 1.54 08/10/2019   HGBA1C 6.0 02/10/2020   MICROALBUR <0.7 08/10/2019    DG Bone Density  Result Date: 11/02/2019 EXAM: DUAL X-RAY ABSORPTIOMETRY (DXA) FOR BONE MINERAL DENSITY IMPRESSION: Dear Dr Grayland Ormond, Your patient Tiffany Chang completed a FRAX assessment on 11/02/2019 using the [place] (analysis version: 14.10) manufactured by [employer]. The following summarizes the results of our evaluation. PATIENT BIOGRAPHICAL: Name: Tiffany, Chang Patient ID: [id number] Birth Date: 09-11-1945 Height:    67.5 in. Gender:     Female    [age]       Weight:    159.0 lbs. Ethnicity:  White                            Exam Date: 11/02/2019 FRAX* RESULTS:  (version: 3.5) 10-year Probability of Fracture1 Major Osteoporotic Fracture2 Hip Fracture 10.8% 1.9% Population: [place] (Caucasian) Risk Factors: None Based on Femur (Right) Neck BMD 1 -The 10-year probability of fracture may be lower than reported if the patient has received treatment. 2 -Major Osteoporotic Fracture: Clinical Spine, Forearm, Hip or Shoulder *FRAX is a Materials engineer of the [employer] for Metabolic Bone Disease, a [place] (WHO) [facility]. ASSESSMENT: The probability of a major osteoporotic fracture is 10.8% within the next ten years. The probability of a hip fracture is 1.9% within the next ten years. . Your patient Tiffany Chang completed a BMD test on 11/02/2019 using  the [place] (software version: 14.10) manufactured by [employer]. The following summarizes the results of our evaluation. Technologist: [facility] PATIENT BIOGRAPHICAL: Name: Monita, Swier Patient ID: [id number] Birth Date: 07/03/1945 Height: 67.5 in. Gender: Female Exam Date: 11/02/2019 Weight: 159.0 lbs. Indications: Height Loss, Postmenopausal, Hypothyroid, Advanced Age, Osteopenia, Vitamin D Deficiency, History of Breast Cancer, High Risk Meds, Caucasian, Family Hx of Osteoporosis Fractures: Treatments: Calcium, Letrozole, Synthroid, Vit D DENSITOMETRY RESULTS: Site      Region     Measured Date Measured Age WHO Classification Young Adult T-score BMD         %Change vs. Previous Significant Change (*) AP Spine L1-L3 11/02/2019 71.7 Osteopenia -1.4 1.012 g/cm2 -2.3% - AP Spine L1-L3 11/01/2018 70.7 Osteopenia -1.2 1.036 g/cm2 -0.5% -  AP Spine L1-L3 10/26/2017 69.7 Osteopenia -1.1 1.041 g/cm2 0.3% - AP Spine L1-L3 10/23/2016 68.7 Osteopenia -1.2 1.038 g/cm2 -5.6% Yes AP Spine L1-L3 10/23/2015 67.7 Normal -0.7 1.099 g/cm2 -0.2% - AP Spine L1-L3 10/31/2014 66.7 Normal -0.7 1.101 g/cm2 -0.5% - AP Spine L1-L3 08/31/2013 65.5 Normal -0.6 1.107 g/cm2 0.2% - AP Spine L1-L3 02/27/2010 62.0 Normal -0.6 1.105 g/cm2 - - DualFemur Neck Right 11/02/2019 71.7 Osteopenia -1.6 0.816 g/cm2 6.7% - DualFemur Neck Right 11/01/2018 70.7 Osteopenia -2.0 0.765 g/cm2 -0.5% - DualFemur Neck Right 10/26/2017 69.7 Osteopenia -1.9 0.769 g/cm2 -0.6% - DualFemur Neck Right 10/23/2016 68.7 Osteopenia -1.9 0.774 g/cm2 -4.1% - DualFemur Neck Right 10/23/2015 67.7 Osteopenia -1.7 0.807 g/cm2 0.2% - DualFemur Neck Right 10/31/2014 66.7 Osteopenia -1.7 0.805 g/cm2 1.5% - DualFemur Neck Right 02/27/2010 62.0 Osteopenia -1.8 0.793 g/cm2 - - DualFemur Total Mean 11/02/2019 71.7 Osteopenia -1.2 0.853 g/cm2 0.8% - DualFemur Total Mean 11/01/2018 70.7 Osteopenia -1.3 0.846 g/cm2 -0.5% - DualFemur Total Mean 10/26/2017 69.7 Osteopenia -1.3  0.850 g/cm2 -1.5% - DualFemur Total Mean 10/23/2016 68.7 Osteopenia -1.1 0.863 g/cm2 -2.6% - DualFemur Total Mean 10/23/2015 67.7 Normal -1.0 0.886 g/cm2 -0.1% - DualFemur Total Mean 10/31/2014 66.7 Normal -1.0 0.887 g/cm2 0.6% - DualFemur Total Mean 02/27/2010 62.0 Normal -1.0 0.882 g/cm2 - - ASSESSMENT: The BMD measured at Femur Neck Right is 0.816 g/cm2 with a T-score of -1.6. This patient is considered osteopenic according to [facility]) criteria. The scan quality is good. L-4 was excluded due to degenerative changes. Compared with prior study, there has been no significant change in the spine. Compared with prior study, there has been no significant change in the total hip. World Pharmacologist [facility]) criteria for post-menopausal, Caucasian Women: Normal:                   T-score at or above -1 SD Osteopenia/low bone mass: T-score between -1 and -2.5 SD Osteoporosis:             T-score at or below -2.5 SD RECOMMENDATIONS: 1. All patients should optimize calcium and vitamin D intake. 2. Consider FDA-approved medical therapies in postmenopausal women and men aged 67 years and older, based on the following: a. A hip or vertebral(clinical or morphometric) fracture b. T-score < -2.5 at the femoral neck or spine after appropriate evaluation to exclude secondary causes c. Low bone mass (T-score between -1.0 and -2.5 at the femoral neck or spine) and a 10-year probability of a hip fracture > 3% or a 10-year probability of a major osteoporosis-related fracture > 20% based on the [place]-adapted WHO algorithm 3. Clinician judgment and/or patient preferences may indicate treatment for people with 10-year fracture probabilities above or below these levels FOLLOW-UP: People with diagnosed cases of osteoporosis or at high risk for fracture should have regular bone mineral density tests. For patients eligible for Medicare, routine testing is allowed once every 2 years. The testing frequency can be  increased to one year for patients who have rapidly progressing disease, those who are receiving or discontinuing medical therapy to restore bone mass, or have additional risk factors. I have reviewed this report, and agree with the above findings. [facility] Electronically Signed   By: Lowella Grip III M.D.   On: 11/02/2019 11:11   MM 3D SCREEN BREAST UNI LEFT  Result Date: 11/03/2019 CLINICAL DATA:  Screening. EXAM: DIGITAL SCREENING UNILATERAL LEFT MAMMOGRAM WITH CAD AND TOMO COMPARISON:  Previous exam(s). ACR Breast Density Category c: The breast  tissue is heterogeneously dense, which may obscure small masses. FINDINGS: The patient has had a right mastectomy. There are no findings suspicious for malignancy. Images were processed with CAD. IMPRESSION: No mammographic evidence of malignancy. A result letter of this screening mammogram will be mailed directly to the patient. RECOMMENDATION: Screening mammogram in one year.  (Code:SM-L-105M) BI-RADS CATEGORY  1: Negative. Electronically Signed   By: Ammie Ferrier M.D.   On: 11/03/2019 13:32    Assessment & Plan:   Problem List Items Addressed This Visit      Unprioritized   Benign essential tremor    Affecting her ability to eat tidily  in public .  She is considering nonsurgical options recommended by Dr Tat.  We also discussed the role of MRI guided ultrasound thalotomy , [facility]25 [facility] prior to social engagements       Breast cancer of upper-outer quadrant of right female breast [facility])    S/p mastectomy and adjuvant therapy for 5/2 years with Letrozole       Relevant Medications   ALPRAZolam (XANAX) 0.25 MG tablet   Hyperlipidemia - Primary    She has suspended generic  Crestor but has deferred  definitive noninvasive  testing after referral to Dr Ubaldo Glassing an dis willing to resume therapy if indicated . 10 yr risk remains elevated at 23% using the [facility]   Lab Results    Component Value Date   CHOL 298 (H) 02/10/2020   HDL 44.60 02/10/2020   LDLCALC 220 (H) 02/10/2020   LDLDIRECT 218.0 08/05/2017   TRIG 169.0 (H) 02/10/2020   CHOLHDL 7 02/10/2020        Relevant Orders   Comprehensive metabolic panel (Completed)   Lipid panel (Completed)   Prediabetes   Relevant Orders   Hemoglobin A1c (Completed)    Other Visit Diagnoses    COVID-19 virus IgG antibody test result equivocal       Relevant Orders   SARS-CoV-2 Semi-Quantitative Total Antibody, Spike      I provided  30 minutes of  face-to-face time during this encounter reviewing patient's current problems and past surgeries, labs and imaging studies, providing counseling on the above mentioned problems , and coordination  of care .  I am having William Dalton start on ALPRAZolam. I am also having her maintain her naproxen sodium, rosuvastatin, Vitamin D3, letrozole, propranolol, primidone, and levothyroxine.  Meds ordered this encounter  Medications  . ALPRAZolam (XANAX) 0.25 MG tablet    Sig: Take 1 tablet (0.25 mg total) by mouth 2 (two) times daily as needed for anxiety.    Dispense:  20 tablet    Refill:  0    There are no discontinued medications.  Follow-up: Return in about 6 months (around 08/09/2020).   Crecencio Mc, MD

## 2020-02-12 ENCOUNTER — Other Ambulatory Visit: Payer: Self-pay | Admitting: Internal Medicine

## 2020-02-12 MED ORDER — ROSUVASTATIN CALCIUM 5 MG PO TABS: 5.0000 mg | ORAL_TABLET | Freq: Every day | ORAL | 1 refills | Status: DC

## 2020-02-12 NOTE — Progress Notes (Signed)
Your cholesterol puts your ten year risk at 23%,  so resuming the rosuvastatin is advised.  I will send the medication to your pharmacy

## 2020-02-12 NOTE — Assessment & Plan Note (Signed)
S/p mastectomy and adjuvant therapy for 5/2 years with Letrozole

## 2020-02-12 NOTE — Assessment & Plan Note (Addendum)
She has suspended generic  Crestor but has deferred  definitive noninvasive  testing after referral to Dr Ubaldo Glassing an dis willing to resume therapy if indicated . 10 yr risk remains elevated at 23% using the [facility]   Lab Results  Component Value Date   CHOL 298 (H) 02/10/2020   HDL 44.60 02/10/2020   LDLCALC 220 (H) 02/10/2020   LDLDIRECT 218.0 08/05/2017   TRIG 169.0 (H) 02/10/2020   CHOLHDL 7 02/10/2020

## 2020-02-12 NOTE — Assessment & Plan Note (Addendum)
Affecting her ability to eat tidily  in public .  She is considering nonsurgical options recommended by Dr Tat.  We also discussed the role of MRI guided ultrasound thalotomy , which is [facility].25 mg prior to social engagements

## 2020-02-13 NOTE — Telephone Encounter (Signed)
I have sent message in regards to the wellness visit with Denisa.

## 2020-02-15 ENCOUNTER — Encounter: Payer: Self-pay | Admitting: Oncology

## 2020-02-15 LAB — SARS-COV-2 SEMI-QUANTITATIVE TOTAL ANTIBODY, SPIKE: SARS COV2 AB, Total Spike Semi QN: 1640 U/mL — ABNORMAL HIGH (ref <0.8)

## 2020-02-15 NOTE — Telephone Encounter (Signed)
Rx has been canceled.  

## 2020-02-21 DIAGNOSIS — Z23 Encounter for immunization: Secondary | ICD-10-CM | POA: Diagnosis not present

## 2020-03-04 ENCOUNTER — Other Ambulatory Visit: Payer: Self-pay | Admitting: Neurology

## 2020-03-05 NOTE — Telephone Encounter (Signed)
Rx(s) sent to pharmacy electronically.  

## 2020-03-16 ENCOUNTER — Ambulatory Visit: Payer: Medicare Other

## 2020-03-19 ENCOUNTER — Other Ambulatory Visit: Payer: Self-pay | Admitting: Neurology

## 2020-03-27 ENCOUNTER — Ambulatory Visit: Payer: Medicare Other | Admitting: Neurology

## 2020-03-27 NOTE — Progress Notes (Signed)
Assessment/Plan:    1.  Essential Tremor moderately severe on the right.  -Continue propranolol, 40 mg twice per day.  -Continue primidone, 50 mg, 3 tablets in the morning and 3 tablets at night.  -Have discussed surgical interventions at length. Have also offered opinions at [facility].  She isn't interested right now  -discussed topamax but decided to hold on that for now  Subjective:   Tiffany Chang was seen today in follow up for essential tremor.  My previous records were reviewed prior to todays visit. Pt denies falls.  Pt denies lightheadedness, near syncope.   Her last visit with primary care was on September 24. I have reviewed those notes.  She just finished a 3.5 week trip out [place].  She was anxious about the trip with her tremor but "it worked out well and was manageable."  She drinks her soup.  She tries to order food to accomodate the tremor.  Writing and brushing the teeth can be tough.  Tolerating the medication okay  Current prescribed movement disorder medications: propranolol, 40 mg twice per day Primidone, 50 mg, 3 tablets in the morning and 3 tablets at night (increased after our last visit)   ALLERGIES:   Allergies  Allergen Reactions  . Statins Other (See Comments)     Leg cramps in high doses  . Bee Venom Hives    CURRENT MEDICATIONS:  Outpatient Encounter Medications as of 03/29/2020  Medication Sig  . ALPRAZolam (XANAX) 0.25 MG tablet Take 1 tablet (0.25 mg total) by mouth 2 (two) times daily as needed for anxiety.  . Cholecalciferol (VITAMIN D3) 50 MCG (2000 UT) TABS Take 1 tablet by mouth daily.   Marland Kitchen levothyroxine (SYNTHROID) 88 MCG tablet TAKE 1 TABLET BY MOUTH EVERY DAY  . naproxen sodium (ALEVE) 220 MG tablet Take 220 mg by mouth 2 (two) times daily as needed (pain).  . primidone (MYSOLINE) 50 MG tablet Take 150 mg by mouth in the morning and at bedtime.  . propranolol (INDERAL) 40 MG tablet TAKE 1 TABLET BY MOUTH TWICE A DAY  . rosuvastatin (CRESTOR) 5  MG tablet Take 1 tablet (5 mg total) by mouth daily.  . [DISCONTINUED] letrozole ([place]) 2.5 MG tablet TAKE 1 TABLET BY MOUTH EVERY DAY  . [DISCONTINUED] primidone (MYSOLINE) 50 MG tablet TAKE 3 TABS IN THE MORNING AND 2 TABS IN THE EVENING (Patient not taking: TAKE 3 TABS IN THE MORNING AND 2 TABS IN THE EVENING)   No facility-administered encounter medications on file as of 03/29/2020.     Objective:    PHYSICAL EXAMINATION:    VITALS:   Vitals:   03/29/20 1428 03/29/20 1429  BP: 106/72 (!) 152/77  Pulse: 75   SpO2: 96%   Weight: 164 lb (74.4 kg)   Height: 5' 7.5" (1.715 m)     GEN:  The patient appears stated age and is in NAD. HEENT:  Normocephalic, atraumatic.  The mucous membranes are moist. The superficial temporal arteries are without ropiness or tenderness. CV:  RRR Lungs:  CTAB Neck/HEME:  There are no carotid bruits bilaterally.  Neurological examination:  Orientation: The patient is alert and oriented x3. Cranial nerves: There is good facial symmetry. The speech is fluent and clear. Soft palate rises symmetrically and there is no tongue deviation. Hearing is intact to conversational tone. Sensation: Sensation is intact to light touch throughout Motor: Strength is at least antigravity x4.  Movement examination: Tone: There is normal tone in the UE/LE  Abnormal movements: there is mod postural tremor when she holds a weight. Trouble with archimedes spirals on the right. Coordination:  There is no decremation with RAM's Gait and Station:  The patient's stride length is good I have reviewed and interpreted the following labs independently   Chemistry      Component Value Date/Time   NA 140 02/10/2020 0840   K 4.8 02/10/2020 0840   CL 103 02/10/2020 0840   CO2 31 02/10/2020 0840   BUN 13 02/10/2020 0840   CREATININE 0.67 02/10/2020 0840      Component Value Date/Time   CALCIUM 9.6 02/10/2020 0840   ALKPHOS 89 02/10/2020 0840   AST 13 02/10/2020 0840    ALT 24 02/10/2020 0840   BILITOT 0.3 02/10/2020 0840      Lab Results  Component Value Date   WBC 6.2 08/10/2019   HGB 14.2 08/10/2019   HCT 42.1 08/10/2019   MCV 88.8 08/10/2019   PLT 320.0 08/10/2019   Lab Results  Component Value Date   TSH 1.54 08/10/2019     Chemistry      Component Value Date/Time   NA 140 02/10/2020 0840   K 4.8 02/10/2020 0840   CL 103 02/10/2020 0840   CO2 31 02/10/2020 0840   BUN 13 02/10/2020 0840   CREATININE 0.67 02/10/2020 0840      Component Value Date/Time   CALCIUM 9.6 02/10/2020 0840   ALKPHOS 89 02/10/2020 0840   AST 13 02/10/2020 0840   ALT 24 02/10/2020 0840   BILITOT 0.3 02/10/2020 0840         Total time spent on today's visit was 30 minutes, including both face-to-face time and nonface-to-face time.  Time included that spent on review of records (prior notes available to me/labs/imaging if pertinent), discussing treatment and goals, answering patient's questions and coordinating care.  Cc:  Crecencio Mc, MD

## 2020-03-29 ENCOUNTER — Ambulatory Visit (INDEPENDENT_AMBULATORY_CARE_PROVIDER_SITE_OTHER): Payer: Medicare Other | Admitting: Neurology

## 2020-03-29 ENCOUNTER — Other Ambulatory Visit: Payer: Self-pay

## 2020-03-29 ENCOUNTER — Encounter: Payer: Self-pay | Admitting: Neurology

## 2020-03-29 VITALS — BP 152/77 | HR 75 | Ht 67.5 in | Wt 164.0 lb

## 2020-03-29 DIAGNOSIS — G250 Essential tremor: Secondary | ICD-10-CM

## 2020-03-29 NOTE — Patient Instructions (Signed)
Continue your current medications.  We can add to the primidone if needed or add topamax in the future if needed.  For now, I wouldn't change anything.  The physicians and staff at [facility] are committed to providing excellent care. You may receive a survey requesting feedback about your experience at our office. We strive to receive "very good" responses to the survey questions. If you feel that your experience would prevent you from giving the office a "very good " response, please contact our office to try to remedy the situation. We may be reached at [phone]. Thank you for taking the time out of your busy day to complete the survey.

## 2020-04-09 ENCOUNTER — Encounter: Payer: Self-pay | Admitting: Oncology

## 2020-04-23 ENCOUNTER — Encounter: Payer: Self-pay | Admitting: Oncology

## 2020-04-23 ENCOUNTER — Other Ambulatory Visit: Payer: Self-pay | Admitting: Internal Medicine

## 2020-05-01 ENCOUNTER — Telehealth: Payer: Medicare Other | Admitting: Oncology

## 2020-05-02 DIAGNOSIS — M11261 Other chondrocalcinosis, right knee: Secondary | ICD-10-CM | POA: Diagnosis not present

## 2020-05-02 DIAGNOSIS — M25561 Pain in right knee: Secondary | ICD-10-CM | POA: Diagnosis not present

## 2020-05-03 ENCOUNTER — Other Ambulatory Visit: Payer: Self-pay | Admitting: Internal Medicine

## 2020-05-04 ENCOUNTER — Telehealth: Payer: Medicare Other | Admitting: Oncology

## 2020-05-04 DIAGNOSIS — M1120 Other chondrocalcinosis, unspecified site: Secondary | ICD-10-CM | POA: Diagnosis not present

## 2020-05-04 DIAGNOSIS — M6281 Muscle weakness (generalized): Secondary | ICD-10-CM | POA: Diagnosis not present

## 2020-05-04 DIAGNOSIS — M25661 Stiffness of right knee, not elsewhere classified: Secondary | ICD-10-CM | POA: Diagnosis not present

## 2020-05-06 NOTE — Progress Notes (Signed)
[place]  [phone] [phone]  ID: Alahni Varone OB: [birth year]  MR#: [id number]  ESP#:[id number]  Patient Care Team: Crecencio Mc, MD as PCP - General (Internal Medicine) Lloyd Huger, MD as Consulting Physician (Oncology)  I connected with Liliane Channel on 05/10/20 at  2:15 PM EST by video enabled telemedicine visit and verified that I am speaking with the correct person using two identifiers.   I discussed the limitations, risks, security and privacy concerns of performing an evaluation and management service by telemedicine and the availability of in-person appointments. I also discussed with the patient that there may be a patient responsible charge related to this service. The patient expressed understanding and agreed to proceed.   Other persons participating in the visit and their role in the encounter: Patient, MD.  Patient's location: Home. Provider's location: Clinic.  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  INTERVAL HISTORY: Patient initially agreed to video assisted telemedicine visit, but secondary to technical difficulties evaluation was transitioned to telephone only.  She continues to feel well and remains asymptomatic.  She has now discontinued letrozole.  She has no neurologic complaints.  She denies any recent fevers or illnesses.  She has a good appetite and denies weight loss.  She denies any chest pain, shortness of breath, cough, or hemoptysis.  She denies any nausea, vomiting, constipation, or diarrhea. She has no urinary complaints.  Patient offers no specific complaints today.  REVIEW OF SYSTEMS:   Review of Systems  Constitutional: Negative.  Negative for fever, malaise/fatigue and weight loss.  Respiratory: Negative.  Negative for cough and shortness of breath.   Cardiovascular: Negative.  Negative for chest pain  and leg swelling.  Gastrointestinal: Negative.  Negative for abdominal pain.  Genitourinary: Negative.  Negative for dysuria.  Musculoskeletal: Negative.  Negative for back pain.  Skin: Negative.  Negative for rash.  Neurological: Negative.  Negative for sensory change, focal weakness, weakness and headaches.  Psychiatric/Behavioral: Negative.  The patient is not nervous/anxious.     As per HPI. Otherwise, a complete review of systems is negative.  PAST MEDICAL HISTORY: Past Medical History:  Diagnosis Date  . Breast cancer ([place]) 2016   [place] - RT MASTECTOMY  . Hashimoto's thyroiditis   . History of arthroscopy of left knee 1985  . Hyperlipidemia    mild, with prior statin intolerance  . Hypothyroid   . Osteoporosis    prior Evista use x 5 yrs , 2007  . Spinal stenosis    mild  . Syncope, vasovagal    3 episodes  . Tremor   . Viral meningitis    74 years old    PAST SURGICAL HISTORY: Past Surgical History:  Procedure Laterality Date  . CATARACT EXTRACTION W/PHACO Left 09/30/2017   Procedure: CATARACT EXTRACTION PHACO AND INTRAOCULAR LENS PLACEMENT (IOC);  Surgeon: Birder Robson, MD;  Location: [facility];  Service: Ophthalmology;  Laterality: Left;  [place] 00:40 AP% 15.7 CDE 6.38 Fluid pack lot # [id number]  . CATARACT EXTRACTION W/PHACO Right 06/07/2019   Procedure: CATARACT EXTRACTION PHACO AND INTRAOCULAR LENS PLACEMENT (IOC) RIGHT 4.62 00:33.5;  Surgeon: Birder Robson, MD;  Location: [place];  Service: Ophthalmology;  Laterality: Right;  . CESAREAN SECTION    . CHOLECYSTECTOMY  2006  . COLONOSCOPY WITH PROPOFOL N/A 09/26/2019   Procedure: COLONOSCOPY WITH BIOPSY;  Surgeon: Lucilla Lame, MD;  Location: [place]  [facility];  Service: Endoscopy;  Laterality: N/A;  priority 3  . FOOT SURGERY     plantar fascitis, Dr. Elvina Mattes  . KNEE ARTHROSCOPY  1985   right  . MASTECTOMY Right 2016   [facility]  . POLYPECTOMY N/A 09/26/2019   Procedure: POLYPECTOMY;  Surgeon:  Lucilla Lame, MD;  Location: [place];  Service: Endoscopy;  Laterality: N/A;  . SENTINEL NODE BIOPSY Right 10/10/2014   Procedure: SENTINEL NODE BIOPSY;  Surgeon: Leonie Green, MD;  Location: [facility];  Service: General;  Laterality: Right;  . SIMPLE MASTECTOMY WITH AXILLARY SENTINEL NODE BIOPSY Right 10/10/2014   Procedure: SIMPLE MASTECTOMY;  Surgeon: Leonie Green, MD;  Location: [facility];  Service: General;  Laterality: Right;  . TONSILLECTOMY      FAMILY HISTORY Family History  Problem Relation Age of Onset  . Osteoporosis Mother   . Aortic aneurysm Father   . Cancer Sister 12       Breast  . Breast cancer Sister 93  . Cancer Maternal Grandfather        breast  . Breast cancer Maternal Grandmother 86       ADVANCED DIRECTIVES:    HEALTH MAINTENANCE: Social History   Tobacco Use  . Smoking status: Never Smoker  . Smokeless tobacco: Never Used  Vaping Use  . Vaping Use: Never used  Substance Use Topics  . Alcohol use: Yes    Alcohol/week: 3.0 standard drinks    Types: 3 Standard drinks or equivalent per week    Comment: social, 2 times per week  . Drug use: No     Colonoscopy:  PAP:  Bone density:  Lipid panel:  Allergies  Allergen Reactions  . Statins Other (See Comments)     Leg cramps in high doses  . Bee Venom Hives    Current Outpatient Medications  Medication Sig Dispense Refill  . ALPRAZolam (XANAX) 0.25 MG tablet Take 1 tablet (0.25 mg total) by mouth 2 (two) times daily as needed for anxiety. 20 tablet 0  . Cholecalciferol (VITAMIN D3) 50 MCG (2000 UT) TABS Take 1 tablet by mouth daily.     Marland Kitchen levothyroxine (SYNTHROID) 88 MCG tablet TAKE 1 TABLET BY MOUTH EVERY DAY 90 tablet 0  . naproxen sodium (ALEVE) 220 MG tablet Take 220 mg by mouth 2 (two) times daily as needed (pain).    . primidone (MYSOLINE) 50 MG tablet Take 200 mg by mouth in the morning and at bedtime.    . propranolol (INDERAL) 40 MG tablet TAKE 1 TABLET BY  MOUTH TWICE A DAY 180 tablet 1  . rosuvastatin (CRESTOR) 5 MG tablet TAKE 1 TABLET BY MOUTH EVERY DAY 90 tablet 1   No current facility-administered medications for this visit.    OBJECTIVE: There were no vitals filed for this visit.   There is no height or weight on file to calculate BMI.    ECOG FS:0 - Asymptomatic   LAB RESULTS:  Lab Results  Component Value Date   NA 140 02/10/2020   K 4.8 02/10/2020   CL 103 02/10/2020   CO2 31 02/10/2020   GLUCOSE 102 (H) 02/10/2020   BUN 13 02/10/2020   CREATININE 0.67 02/10/2020   CALCIUM 9.6 02/10/2020   PROT 7.2 02/10/2020   ALBUMIN 4.6 02/10/2020   AST 13 02/10/2020   ALT 24 02/10/2020   ALKPHOS 89 02/10/2020   BILITOT 0.3 02/10/2020   GFRNONAA >60 10/03/2014   GFRAA >60 10/03/2014    Lab  Results  Component Value Date   WBC 6.2 08/10/2019   NEUTROABS 4.1 08/10/2019   HGB 14.2 08/10/2019   HCT 42.1 08/10/2019   MCV 88.8 08/10/2019   PLT 320.0 08/10/2019     STUDIES: No results found.  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  PLAN:    1. 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 Given the fact that her malignancy is lobular in nature, she did not require chemotherapy.  Patient has now completed 5 years of letrozole and treatment was discontinued.  Her most recent left unilateral screening mammogram on November 02, 2019 was reported as BI-RADS 1.  Return to clinic in 6 months with repeat mammogram and further evaluation at which point patient can be transitioned to yearly evaluation. 2. Osteopenia: Patient's most recent bone mineral density from November 02, 2019 reported T score of -1.6 which is improved from 1 year prior. Continue calcium and vitamin D supplementation.  Repeat in June 2022 along with her mammogram as above.  3. Left breast abnormality: Patient had breast biopsy on August 21, 2015 that was negative for malignancy. Continue mammograms as above.     I provided 20 minutes of face-to-face video visit time during this encounter which included chart review, counseling, and coordination of care as documented above.   Patient expressed understanding and was in agreement with this plan. She also understands that She can call clinic at any time with any questions, concerns, or complaints.    Lloyd Huger, MD   05/10/2020 9:31 AM

## 2020-05-08 ENCOUNTER — Encounter: Payer: Self-pay | Admitting: Oncology

## 2020-05-08 ENCOUNTER — Inpatient Hospital Stay: Payer: Medicare Other | Attending: Oncology | Admitting: Oncology

## 2020-05-08 DIAGNOSIS — Z170 Estrogen receptor positive status [ER+]: Secondary | ICD-10-CM

## 2020-05-08 DIAGNOSIS — M1120 Other chondrocalcinosis, unspecified site: Secondary | ICD-10-CM | POA: Diagnosis not present

## 2020-05-08 DIAGNOSIS — C50411 Malignant neoplasm of upper-outer quadrant of right female breast: Secondary | ICD-10-CM | POA: Diagnosis not present

## 2020-05-08 DIAGNOSIS — M6281 Muscle weakness (generalized): Secondary | ICD-10-CM | POA: Diagnosis not present

## 2020-05-08 DIAGNOSIS — M25661 Stiffness of right knee, not elsewhere classified: Secondary | ICD-10-CM | POA: Diagnosis not present

## 2020-05-08 NOTE — Progress Notes (Signed)
Patient states she is now going to physical therapy.

## 2020-05-16 DIAGNOSIS — M1120 Other chondrocalcinosis, unspecified site: Secondary | ICD-10-CM | POA: Diagnosis not present

## 2020-05-16 DIAGNOSIS — M25661 Stiffness of right knee, not elsewhere classified: Secondary | ICD-10-CM | POA: Diagnosis not present

## 2020-05-16 DIAGNOSIS — M6281 Muscle weakness (generalized): Secondary | ICD-10-CM | POA: Diagnosis not present

## 2020-05-31 ENCOUNTER — Other Ambulatory Visit: Payer: Self-pay

## 2020-05-31 MED ORDER — PRIMIDONE 50 MG PO TABS: 200.0000 mg | ORAL_TABLET | Freq: Two times a day (BID) | ORAL | 4 refills | Status: DC

## 2020-05-31 NOTE — Telephone Encounter (Signed)
Rx(s) sent to pharmacy electronically.  

## 2020-06-04 ENCOUNTER — Ambulatory Visit (INDEPENDENT_AMBULATORY_CARE_PROVIDER_SITE_OTHER): Payer: Medicare Other

## 2020-06-04 VITALS — Ht 67.5 in | Wt 164.0 lb

## 2020-06-04 DIAGNOSIS — Z0000 Encounter for general adult medical examination without abnormal findings: Secondary | ICD-10-CM

## 2020-06-04 NOTE — Progress Notes (Addendum)
Subjective:   Tiffany Chang is a 75 y.o. female who presents for Medicare Annual (Subsequent) preventive examination.  Review of Systems    No ROS.  Medicare Wellness Virtual Visit.   Cardiac Risk Factors include: advanced age (>49men, >31 women)     Objective:    Today's Vitals   06/04/20 1032  Weight: 164 lb (74.4 kg)  Height: 5' 7.5" (1.715 m)   Body mass index is 25.31 kg/m.  Advanced Directives 06/04/2020 05/08/2020 03/29/2020 11/01/2019 09/29/2019 09/26/2019 06/07/2019  Does Patient Have a Medical Advance Directive? Yes No Yes Yes Yes Yes Yes  Type of Estate agent of [place];Living will - Healthcare Power of [place];Living will Healthcare Power of [place];Living will Healthcare Power of [place];Living will Healthcare Power of [place];Living will Healthcare Power of [place];Living will  Does patient want to make changes to medical advance directive? No - Patient declined - - No - Patient declined - No - Guardian declined No - Patient declined  Copy of Healthcare Power of Attorney in Chart? No - copy requested - - No - copy requested - Yes - validated most recent copy scanned in chart (See row information) No - copy requested  Would patient like information on creating a medical advance directive? - No - Patient declined - - - - -    Current Medications (verified) Outpatient Encounter Medications as of 06/04/2020  Medication Sig   ALPRAZolam (XANAX) 0.25 MG tablet Take 1 tablet (0.25 mg total) by mouth 2 (two) times daily as needed for anxiety.   Cholecalciferol (VITAMIN D3) 50 MCG (2000 UT) TABS Take 1 tablet by mouth daily.    levothyroxine (SYNTHROID) 88 MCG tablet TAKE 1 TABLET BY MOUTH EVERY DAY   naproxen sodium (ALEVE) 220 MG tablet Take 220 mg by mouth 2 (two) times daily as needed (pain).   primidone (MYSOLINE) 50 MG tablet Take 4 tablets (200 mg total) by mouth in the morning and at bedtime.   propranolol (INDERAL) 40 MG tablet TAKE 1 TABLET  BY MOUTH TWICE A DAY   rosuvastatin (CRESTOR) 5 MG tablet TAKE 1 TABLET BY MOUTH EVERY DAY   No facility-administered encounter medications on file as of 06/04/2020.    Allergies (verified) Statins and Bee venom   History: Past Medical History:  Diagnosis Date   Breast cancer ([facility]) 2016   [facility] - RT MASTECTOMY   Hashimoto's thyroiditis    History of arthroscopy of left knee 1985   Hyperlipidemia    mild, with prior statin intolerance   Hypothyroid    Osteoporosis    prior Evista use x 5 yrs , 2007   Spinal stenosis    mild   Syncope, vasovagal    3 episodes   Tremor    Viral meningitis    75 years old   Past Surgical History:  Procedure Laterality Date   CATARACT EXTRACTION W/PHACO Left 09/30/2017   Procedure: CATARACT EXTRACTION PHACO AND INTRAOCULAR LENS PLACEMENT (IOC);  Surgeon: Galen Manila, MD;  Location: [facility];  Service: Ophthalmology;  Laterality: Left;  [place] 00:40 AP% 15.7 CDE 6.38 Fluid pack lot # [id number]   CATARACT EXTRACTION W/PHACO Right 06/07/2019   Procedure: CATARACT EXTRACTION PHACO AND INTRAOCULAR LENS PLACEMENT (IOC) RIGHT 4.62 00:33.5;  Surgeon: Galen Manila, MD;  [facility];  Service: Ophthalmology;  Laterality: Right;   CESAREAN SECTION     CHOLECYSTECTOMY  2006   COLONOSCOPY WITH PROPOFOL N/A 09/26/2019   Procedure: COLONOSCOPY WITH BIOPSY;  Surgeon: Servando Snare,  Darren, MD;  Location: [place];  Service: Endoscopy;  Laterality: N/A;  priority 3   FOOT SURGERY     plantar fascitis, Dr. Elvina Mattes   KNEE ARTHROSCOPY  1985   right   MASTECTOMY Right 2016   [place]   POLYPECTOMY N/A 09/26/2019   Procedure: POLYPECTOMY;  Surgeon: Lucilla Lame, MD;  Location: [place];  Service: Endoscopy;  Laterality: N/A;   SENTINEL NODE BIOPSY Right 10/10/2014   Procedure: SENTINEL NODE BIOPSY;  Surgeon: Leonie Green, MD;  Location: [facility];  Service: General;  Laterality: Right;   SIMPLE MASTECTOMY WITH AXILLARY SENTINEL  NODE BIOPSY Right 10/10/2014   Procedure: SIMPLE MASTECTOMY;  Surgeon: Leonie Green, MD;  Location: [facility];  Service: General;  Laterality: Right;   TONSILLECTOMY     Family History  Problem Relation Age of Onset   Osteoporosis Mother    Aortic aneurysm Father    Cancer Sister 32       Breast   Breast cancer Sister 6   Cancer Maternal Grandfather        breast   Breast cancer Maternal Grandmother 86   Social History   Socioeconomic History   Marital status: Married    Spouse name: Not on file   Number of children: Not on file   Years of education: Not on file   Highest education level: Not on file  Occupational History    Employer: [employer]    Comment: exercise physiologist  Tobacco Use   Smoking status: Never Smoker   Smokeless tobacco: Never Used  Scientific laboratory technician Use: Never used  Substance and Sexual Activity   Alcohol use: Yes    Alcohol/week: 3.0 standard drinks    Types: 3 Standard drinks or equivalent per week    Comment: social, 2 times per week   Drug use: No   Sexual activity: Not Currently  Other Topics Concern   Not on file  Social History Narrative   Right handed   2 grown children   3 story home   Social Determinants of Health   Financial Resource Strain: Not on file  Food Insecurity: Not on file  Transportation Needs: Not on file  Physical Activity: Not on file  Stress: Not on file  Social Connections: Not on file    Tobacco Counseling Counseling given: Not Answered   Clinical Intake:  Pre-visit preparation completed: Yes        Diabetes: No  How often do you need to have someone help you when you read instructions, pamphlets, or other written materials from your doctor or pharmacy?: 1 - Never   Interpreter Needed?: No      Activities of Daily Living In your present state of health, do you have any difficulty performing the following activities: 06/04/2020 09/26/2019  Hearing? N N   Vision? N N  Difficulty concentrating or making decisions? N N  Walking or climbing stairs? N N  Dressing or bathing? N N  Doing errands, shopping? N -  Preparing Food and eating ? N -  Using the Toilet? N -  In the past six months, have you accidently leaked urine? N -  Do you have problems with loss of bowel control? N -  Managing your Medications? N -  Managing your Finances? N -  Housekeeping or managing your Housekeeping? N -  Some recent data might be hidden    Patient Care Team: Crecencio Mc,  MD as PCP - General (Internal Medicine) Lloyd Huger, MD as Consulting Physician (Oncology)  Indicate any recent Medical Services you may have received from other than [facility] providers in the past year (date may be approximate).     Assessment:   This is a routine wellness examination for Natalia.  I connected with Nikol today by telephone and verified that I am speaking with the correct person using two identifiers. Location patient: home Location provider: work Persons participating in the virtual visit: patient, Marine scientist.    I discussed the limitations, risks, security and privacy concerns of performing an evaluation and management service by telephone and the availability of in person appointments. The patient expressed understanding and verbally consented to this telephonic visit.    Interactive audio and video telecommunications were attempted between this provider and patient, however failed, due to patient having technical difficulties OR patient did not have access to video capability.  We continued and completed visit with audio only.  Some vital signs may be absent or patient reported.   Hearing/Vision screen  Hearing Screening   125Hz  250Hz  500Hz  1000Hz  2000Hz  3000Hz  4000Hz  6000Hz  8000Hz   Right ear:           Left ear:           Comments: Patient is able to hear conversational tones without difficulty.  No issues reported.  Vision Screening Comments: Followed by  [facility]  Wears corrective lenses  Visual acuity not assessed per patient preference since they have regular follow up with the ophthalmologist  Dietary issues and exercise activities discussed: Current Exercise Habits: Home exercise routine, Time (Minutes): 60, Frequency (Times/Week): 2, Weekly Exercise (Minutes/Week): 120, Intensity: Mild  Healthy diet Good water intake   Goals       Patient Stated     I would like to lose 5-8lb (pt-stated)      Healthy diet      Other     Follow up with Primary Care Provider      As needed       Depression Screen [facility] 2/9 Scores 06/04/2020 02/10/2020 09/29/2019 03/16/2019 08/05/2017 08/04/2016 07/25/2016  PHQ - 2 Score 0 0 0 0 0 0 0  PHQ- 9 Score - - - - 0 - -    Fall Risk Fall Risk  06/04/2020 03/29/2020 02/10/2020 09/29/2019 08/10/2019  Falls in the past year? 0 0 0 0 0  Number falls in past yr: 0 0 0 0 -  Injury with Fall? 0 0 0 0 -  Risk for fall due to : - - - - -  Follow up Falls evaluation completed - Falls evaluation completed - Falls evaluation completed    [place]: Handrails in use when climbing stairs? Yes Home free of loose throw rugs in walkways, pet beds, electrical cords, etc? Yes  Adequate lighting in your home to reduce risk of falls? Yes   ASSISTIVE DEVICES UTILIZED TO PREVENT FALLS: Use of a cane, walker or w/c? No   TIMED UP AND GO: Was the test performed? No . Virtual visit.   Cognitive Function: Patient is alert and oriented x3.  Denies difficulty focusing, making decisions, memory loss.  Enjoys playing bridge card game, sodoku and other brain health activities.  MMSE/6CIT deferred. Normal by direct communication/observation.  MMSE - Mini Mental State Exam 08/05/2017  Orientation to time 5  Orientation to Place 5  Registration 3  Attention/ Calculation 5  Recall 3  Language-  name 2 objects 2  Language- repeat 1  Language- follow 3 step command 3  Language- read &  follow direction 1  Write a sentence 0  Write a sentence-comments Did not ask to complete; tremor  Copy design 0  Copy design-comments Did not ask to complete; tremor  Total score 28     6CIT Screen 08/04/2016  What Year? 0 points  What month? 0 points  What time? 0 points  Count back from 20 0 points  Months in reverse 0 points  Repeat phrase 0 points  Total Score 0    Immunizations Immunization History  Administered Date(s) Administered   Influenza Inj Mdck Quad With Preservative 03/02/2018   Influenza Split 02/17/2012, 02/16/2013   Influenza, High Dose Seasonal PF 02/12/2019, 02/16/2019, 01/31/2020   Influenza-Unspecified 02/16/2014, 02/17/2015, 03/06/2016, 02/13/2017, 03/02/2018   PFIZER(Purple Top)SARS-COV-2 Vaccination 06/21/2019, 07/12/2019   Pneumococcal Conjugate-13 07/11/2013   Pneumococcal Polysaccharide-23 08/04/2016   Tdap 06/29/2012   Zoster 06/25/2013   Zoster Recombinat (Shingrix) 01/22/2018, 01/25/2019   Health Maintenance Health Maintenance  Topic Date Due   COVID-19 Vaccine (3 - Pfizer risk 4-dose series) 08/09/2019   MAMMOGRAM  11/01/2021   TETANUS/TDAP  06/29/2022   COLONOSCOPY (Pts 45-67yrs Insurance coverage will need to be confirmed)  09/25/2024   INFLUENZA VACCINE  Completed   DEXA SCAN  Completed   Hepatitis C Screening  Completed   PNA vac Low Risk Adult  Completed   Colorectal cancer screening: Type of screening: Colonoscopy. Completed 09/26/19. Repeat every 5 years  Mammogram status: Completed 11/02/19. Repeat every year. MM 3D SCREEN BREAST UNI LEFT    Bone Density status: Completed 11/02/19. Results reflect: Bone density results: OSTEOPOROSIS. Repeat every 2 years.  Lung Cancer Screening: (Low Dose CT Chest recommended if Age 65-80 years, 30 pack-year currently smoking OR have quit w/in 15years.) does not qualify.   Hepatitis C Screening: Completed 08/06/15.  Covid vaccine booster- agrees to update immunization record via mychart or next  office visit.   Vision Screening: Recommended annual ophthalmology exams for early detection of glaucoma and other disorders of the eye. Is the patient up to date with their annual eye exam?  Yes  Who is the provider or what is the name of the office in which the patient attends annual eye exams? [facility]  Dental Screening: Recommended annual dental exams for proper oral hygiene. Visits every 6 months.   Community Resource Referral / Chronic Care Management: CRR required this visit?  No   CCM required this visit?  No      Plan:   Keep all routine maintenance appointments.   Follow up 08/09/20 @ 8:30  I have personally reviewed and noted the following in the patient's chart:   Medical and social history Use of alcohol, tobacco or illicit drugs  Current medications and supplements Functional ability and status Nutritional status Physical activity Advanced directives List of other physicians Hospitalizations, surgeries, and ER visits in previous 12 months Vitals Screenings to include cognitive, depression, and falls Referrals and appointments  In addition, I have reviewed and discussed with patient certain preventive protocols, quality metrics, and best practice recommendations. A written personalized care plan for preventive services as well as general preventive health recommendations were provided to patient via mychart.     OBrien-Blaney, Dalynn Jhaveri L, LPN   7/41/2878    I have reviewed the above information and agree with above.   Duncan Dull, MD

## 2020-06-04 NOTE — Patient Instructions (Addendum)
Tiffany Chang , Thank you for taking time to come for your Medicare Wellness Visit. I appreciate your ongoing commitment to your health goals. Please review the following plan we discussed and let me know if I can assist you in the future.   These are the goals we discussed: Goals      Patient Stated   .  I would like to lose 5-8lb (pt-stated)      Healthy diet      Other   .  Follow up with Primary Care Provider      As needed       This is a list of the screening recommended for you and due dates:  Health Maintenance  Topic Date Due  . COVID-19 Vaccine (3 - Pfizer risk 4-dose series) 08/09/2019  . Mammogram  11/01/2021  . Tetanus Vaccine  06/29/2022  . Colon Cancer Screening  09/25/2024  . Flu Shot  Completed  . DEXA scan (bone density measurement)  Completed  .  Hepatitis C: One time screening is recommended by Center for Disease Control  (CDC) for  adults born from [birth year] through 1963.   Completed  . Pneumonia vaccines  Completed   Immunizations Immunization History  Administered Date(s) Administered  . Influenza Inj Mdck Quad With Preservative 03/02/2018  . Influenza Split 02/17/2012, 02/16/2013  . Influenza, High Dose Seasonal PF 02/12/2019, 02/16/2019  . Influenza-Unspecified 02/16/2014, 02/17/2015, 03/06/2016, 02/13/2017, 03/02/2018  . PFIZER(Purple Top)SARS-COV-2 Vaccination 06/21/2019, 07/12/2019  . Pneumococcal Conjugate-13 07/11/2013  . Pneumococcal Polysaccharide-23 08/04/2016  . Tdap 06/29/2012  . Zoster 06/25/2013  . Zoster Recombinat (Shingrix) 01/22/2018, 01/25/2019   Keep all routine maintenance appointments.   Follow up 08/09/20 @ 8:30  Advanced directives: End of life planning; Advance aging; Advanced directives discussed.  Copy of current HCPOA/Living Will requested.    Conditions/risks identified: none new  Follow up in one year for your annual wellness visit.   Preventive Care 32 Years and Older, Female Preventive care refers to lifestyle choices  and visits with your health care provider that can promote health and wellness. What does preventive care include?  A yearly physical exam. This is also called an annual well check.  Dental exams once or twice a year.  Routine eye exams. Ask your health care provider how often you should have your eyes checked.  Personal lifestyle choices, including:  Daily care of your teeth and gums.  Regular physical activity.  Eating a healthy diet.  Avoiding tobacco and drug use.  Limiting alcohol use.  Practicing safe sex.  Taking low-dose aspirin every day.  Taking vitamin and mineral supplements as recommended by your health care provider. What happens during an annual well check? The services and screenings done by your health care provider during your annual well check will depend on your age, overall health, lifestyle risk factors, and family history of disease. Counseling  Your health care provider may ask you questions about your:  Alcohol use.  Tobacco use.  Drug use.  Emotional well-being.  Home and relationship well-being.  Sexual activity.  Eating habits.  History of falls.  Memory and ability to understand (cognition).  Work and work Statistician.  Reproductive health. Screening  You may have the following tests or measurements:  Height, weight, and BMI.  Blood pressure.  Lipid and cholesterol levels. These may be checked every 5 years, or more frequently if you are over 89 years old.  Skin check.  Lung cancer screening. You may have this screening every year  starting at age 68 if you have a 30-pack-year history of smoking and currently smoke or have quit within the past 15 years.  Fecal occult blood test (FOBT) of the stool. You may have this test every year starting at age 49.  Flexible sigmoidoscopy or colonoscopy. You may have a sigmoidoscopy every 5 years or a colonoscopy every 10 years starting at age 36.  Hepatitis C blood test.  Hepatitis  B blood test.  Sexually transmitted disease (STD) testing.  Diabetes screening. This is done by checking your blood sugar (glucose) after you have not eaten for a while (fasting). You may have this done every 1-3 years.  Bone density scan. This is done to screen for osteoporosis. You may have this done starting at age 62.  Mammogram. This may be done every 1-2 years. Talk to your health care provider about how often you should have regular mammograms. Talk with your health care provider about your test results, treatment options, and if necessary, the need for more tests. Vaccines  Your health care provider may recommend certain vaccines, such as:  Influenza vaccine. This is recommended every year.  Tetanus, diphtheria, and acellular pertussis (Tdap, Td) vaccine. You may need a Td booster every 10 years.  Zoster vaccine. You may need this after age 66.  Pneumococcal 13-valent conjugate (PCV13) vaccine. One dose is recommended after age 106.  Pneumococcal polysaccharide (PPSV23) vaccine. One dose is recommended after age 11. Talk to your health care provider about which screenings and vaccines you need and how often you need them. This information is not intended to replace advice given to you by your health care provider. Make sure you discuss any questions you have with your health care provider. Document Released: 06/01/2015 Document Revised: 01/23/2016 Document Reviewed: 03/06/2015 Elsevier Interactive Patient Education  2017 [employer].  Fall Prevention in the Home Falls can cause injuries. They can happen to people of all ages. There are many things you can do to make your home safe and to help prevent falls. What can I do on the outside of my home?  Regularly fix the edges of walkways and driveways and fix any cracks.  Remove anything that might make you trip as you walk through a door, such as a raised step or threshold.  Trim any bushes or trees on the path to your  home.  Use bright outdoor lighting.  Clear any walking paths of anything that might make someone trip, such as rocks or tools.  Regularly check to see if handrails are loose or broken. Make sure that both sides of any steps have handrails.  Any raised decks and porches should have guardrails on the edges.  Have any leaves, snow, or ice cleared regularly.  Use sand or salt on walking paths during winter.  Clean up any spills in your garage right away. This includes oil or grease spills. What can I do in the bathroom?  Use night lights.  Install grab bars by the toilet and in the tub and shower. Do not use towel bars as grab bars.  Use non-skid mats or decals in the tub or shower.  If you need to sit down in the shower, use a plastic, non-slip stool.  Keep the floor dry. Clean up any water that spills on the floor as soon as it happens.  Remove soap buildup in the tub or shower regularly.  Attach bath mats securely with double-sided non-slip rug tape.  Do not have throw rugs and other  things on the floor that can make you trip. What can I do in the bedroom?  Use night lights.  Make sure that you have a light by your bed that is easy to reach.  Do not use any sheets or blankets that are too big for your bed. They should not hang down onto the floor.  Have a firm chair that has side arms. You can use this for support while you get dressed.  Do not have throw rugs and other things on the floor that can make you trip. What can I do in the kitchen?  Clean up any spills right away.  Avoid walking on wet floors.  Keep items that you use a lot in easy-to-reach places.  If you need to reach something above you, use a strong step stool that has a grab bar.  Keep electrical cords out of the way.  Do not use floor polish or wax that makes floors slippery. If you must use wax, use non-skid floor wax.  Do not have throw rugs and other things on the floor that can make you  trip. What can I do with my stairs?  Do not leave any items on the stairs.  Make sure that there are handrails on both sides of the stairs and use them. Fix handrails that are broken or loose. Make sure that handrails are as long as the stairways.  Check any carpeting to make sure that it is firmly attached to the stairs. Fix any carpet that is loose or worn.  Avoid having throw rugs at the top or bottom of the stairs. If you do have throw rugs, attach them to the floor with carpet tape.  Make sure that you have a light switch at the top of the stairs and the bottom of the stairs. If you do not have them, ask someone to add them for you. What else can I do to help prevent falls?  Wear shoes that:  Do not have high heels.  Have rubber bottoms.  Are comfortable and fit you well.  Are closed at the toe. Do not wear sandals.  If you use a stepladder:  Make sure that it is fully opened. Do not climb a closed stepladder.  Make sure that both sides of the stepladder are locked into place.  Ask someone to hold it for you, if possible.  Clearly mark and make sure that you can see:  Any grab bars or handrails.  First and last steps.  Where the edge of each step is.  Use tools that help you move around (mobility aids) if they are needed. These include:  Canes.  Walkers.  Scooters.  Crutches.  Turn on the lights when you go into a dark area. Replace any light bulbs as soon as they burn out.  Set up your furniture so you have a clear path. Avoid moving your furniture around.  If any of your floors are uneven, fix them.  If there are any pets around you, be aware of where they are.  Review your medicines with your doctor. Some medicines can make you feel dizzy. This can increase your chance of falling. Ask your doctor what other things that you can do to help prevent falls. This information is not intended to replace advice given to you by your health care provider. Make  sure you discuss any questions you have with your health care provider. Document Released: 03/01/2009 Document Revised: 10/11/2015 Document Reviewed: 06/09/2014 Elsevier Interactive Patient  Education  2017 [employer].

## 2020-08-02 ENCOUNTER — Other Ambulatory Visit: Payer: Self-pay | Admitting: Neurology

## 2020-08-09 ENCOUNTER — Other Ambulatory Visit: Payer: Self-pay

## 2020-08-09 ENCOUNTER — Ambulatory Visit (INDEPENDENT_AMBULATORY_CARE_PROVIDER_SITE_OTHER): Payer: Medicare Other | Admitting: Internal Medicine

## 2020-08-09 ENCOUNTER — Encounter: Payer: Self-pay | Admitting: Internal Medicine

## 2020-08-09 VITALS — BP 110/80 | HR 75 | Temp 98.4°F | Ht 68.0 in | Wt 163.4 lb

## 2020-08-09 DIAGNOSIS — R251 Tremor, unspecified: Secondary | ICD-10-CM

## 2020-08-09 DIAGNOSIS — E039 Hypothyroidism, unspecified: Secondary | ICD-10-CM

## 2020-08-09 DIAGNOSIS — D126 Benign neoplasm of colon, unspecified: Secondary | ICD-10-CM

## 2020-08-09 DIAGNOSIS — E782 Mixed hyperlipidemia: Secondary | ICD-10-CM

## 2020-08-09 DIAGNOSIS — Z170 Estrogen receptor positive status [ER+]: Secondary | ICD-10-CM

## 2020-08-09 DIAGNOSIS — E559 Vitamin D deficiency, unspecified: Secondary | ICD-10-CM | POA: Diagnosis not present

## 2020-08-09 DIAGNOSIS — R7303 Prediabetes: Secondary | ICD-10-CM

## 2020-08-09 DIAGNOSIS — Z23 Encounter for immunization: Secondary | ICD-10-CM

## 2020-08-09 DIAGNOSIS — C50411 Malignant neoplasm of upper-outer quadrant of right female breast: Secondary | ICD-10-CM | POA: Diagnosis not present

## 2020-08-09 LAB — COMPREHENSIVE METABOLIC PANEL
ALT: 14 U/L (ref 0–35)
AST: 12 U/L (ref 0–37)
Albumin: 4.6 g/dL (ref 3.5–5.2)
Alkaline Phosphatase: 83 U/L (ref 39–117)
BUN: 16 mg/dL (ref 6–23)
CO2: 33 mEq/L — ABNORMAL HIGH (ref 19–32)
Calcium: 9.6 mg/dL (ref 8.4–10.5)
Chloride: 102 mEq/L (ref 96–112)
Creatinine, Ser: 0.69 mg/dL (ref 0.40–1.20)
GFR: 86.64 mL/min (ref >60.00)
Glucose, Bld: 107 mg/dL — ABNORMAL HIGH (ref 70–99)
Potassium: 4.8 mEq/L (ref 3.5–5.1)
Sodium: 140 mEq/L (ref 135–145)
Total Bilirubin: 0.4 mg/dL (ref 0.2–1.2)
Total Protein: 7 g/dL (ref 6.0–8.3)

## 2020-08-09 LAB — TSH: TSH: 6.74 u[IU]/mL — ABNORMAL HIGH (ref 0.35–4.50)

## 2020-08-09 LAB — LIPID PANEL
Cholesterol: 209 mg/dL — ABNORMAL HIGH (ref 0–200)
HDL: 54 mg/dL (ref >39.00)
LDL Cholesterol: 128 mg/dL — ABNORMAL HIGH (ref 0–99)
NonHDL: 155.26
Total CHOL/HDL Ratio: 4
Triglycerides: 135 mg/dL (ref 0.0–149.0)
VLDL: 27 mg/dL (ref 0.0–40.0)

## 2020-08-09 LAB — VITAMIN D 25 HYDROXY (VIT D DEFICIENCY, FRACTURES): VITD: 36 ng/mL (ref 30.00–100.00)

## 2020-08-09 LAB — HEMOGLOBIN A1C: Hgb A1c MFr Bld: 5.8 % (ref 4.6–6.5)

## 2020-08-09 NOTE — Patient Instructions (Signed)
Good to see you!   If you or Louie Casa want any "what if" meds to take with you to [facility],  Call me  [phone]

## 2020-08-09 NOTE — Progress Notes (Signed)
Subjective:  Patient ID: Tiffany Chang, female    DOB: Jun 04, 1945  Age: 75 y.o. MRN: [id number]  CC: The primary encounter diagnosis was Acquired hypothyroidism. Diagnoses of Malignant neoplasm of upper-outer quadrant of right breast in female, estrogen receptor positive ([place]), Vitamin D deficiency, Prediabetes, Mixed hyperlipidemia, COVID-19 vaccine administered, Tubular adenoma of colon, and Tremor were also pertinent to this visit.  HPI Tiffany Chang presents for follow up on multiple issues  This visit occurred during the SARS-CoV-2 public health emergency.  Safety protocols were in place, including screening questions prior to the visit, additional usage of staff PPE, and extensive cleaning of exam room while observing appropriate contact time as indicated for disinfecting solutions.   Right knee pain:  Saw an Orthopedist in [place] who ordered PT x 2 months,  Which she has completed. There has been an improvement in her knee pain ..  OA of hips still bothersome especially on long trips.  She is planning a 2 week trip to [place] in the near future to attend her daughter's wedding    Taking crestor for. management of hyperlipidemia without side effects.  Has not had coronary calcium score offered by [facility].   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 Dietary choices and physical activity were reviewed in detail today, and barriers in approach and execution were identified and addressed.  Questions about various commercially advocated diet plans were answered.   Portion size reduction, attention to and restriction of "added sugar,"  as well the need for more consistent daily  exercise to improve insulin sensitivity and achieve a daily negative calorie balance were all discussed in detail.          Outpatient Medications Prior to Visit  Medication Sig  Dispense Refill  . ALPRAZolam (XANAX) 0.25 MG tablet Take 1 tablet (0.25 mg total) by mouth 2 (two) times daily as needed for anxiety. 20 tablet 0  . Cholecalciferol (VITAMIN D3) 50 MCG (2000 UT) TABS Take 1 tablet by mouth daily.     . naproxen sodium (ALEVE) 220 MG tablet Take 220 mg by mouth 2 (two) times daily as needed (pain).    Marland Kitchen levothyroxine (SYNTHROID) 88 MCG tablet TAKE 1 TABLET BY MOUTH EVERY DAY 90 tablet 0  . propranolol (INDERAL) 40 MG tablet TAKE 1 TABLET BY MOUTH TWICE A DAY 120 tablet 0  . rosuvastatin (CRESTOR) 5 MG tablet TAKE 1 TABLET BY MOUTH EVERY DAY 90 tablet 1  . primidone (MYSOLINE) 50 MG tablet Take 4 tablets (200 mg total) by mouth in the morning and at bedtime. 240 tablet 4   No facility-administered medications prior to visit.    Review of Systems;  Patient denies headache, fevers, malaise, unintentional weight loss, skin rash, eye pain, sinus congestion and sinus pain, sore throat, dysphagia,  hemoptysis , cough, dyspnea, wheezing, chest pain, palpitations, orthopnea, edema, abdominal pain, nausea, melena, diarrhea, constipation, flank pain, dysuria, hematuria, urinary  Frequency, nocturia, numbness, tingling, seizures,  Focal weakness, Loss of consciousness,  Tremor, insomnia, depression, anxiety, and suicidal ideation.      Objective:  BP 110/80   Pulse 75   Temp 98.4 F (36.9 C) (Oral)   Ht 5\' 8"  (1.727 m)   Wt 163 lb 6.4 oz (74.1 kg)   SpO2 99%   BMI 24.84 kg/m   BP Readings from Last 3  Encounters:  08/09/20 110/80  03/29/20 (!) 152/77  02/10/20 140/80    Wt Readings from Last 3 Encounters:  08/09/20 163 lb 6.4 oz (74.1 kg)  06/04/20 164 lb (74.4 kg)  03/29/20 164 lb (74.4 kg)    General appearance: alert, cooperative and appears stated age Ears: normal TM's and external ear canals both ears Throat: lips, mucosa, and tongue normal; teeth and gums normal Neck: no adenopathy, no carotid bruit, supple, symmetrical, trachea midline and  thyroid not enlarged, symmetric, no tenderness/mass/nodules Back: symmetric, no curvature. ROM normal. No CVA tenderness. Lungs: clear to auscultation bilaterally Heart: regular rate and rhythm, S1, S2 normal, no murmur, click, rub or gallop Abdomen: soft, non-tender; bowel sounds normal; no masses,  no organomegaly Pulses: 2+ and symmetric Skin: Skin color, texture, turgor normal. No rashes or lesions Lymph nodes: Cervical, supraclavicular, and axillary nodes normal.  Lab Results  Component Value Date   HGBA1C 5.8 08/09/2020   HGBA1C 6.0 02/10/2020   HGBA1C 6.0 08/09/2018    Lab Results  Component Value Date   CREATININE 0.69 08/09/2020   CREATININE 0.67 02/10/2020   CREATININE 0.66 08/10/2019    Lab Results  Component Value Date   WBC 6.2 08/10/2019   HGB 14.2 08/10/2019   HCT 42.1 08/10/2019   PLT 320.0 08/10/2019   GLUCOSE 107 (H) 08/09/2020   CHOL 209 (H) 08/09/2020   TRIG 135.0 08/09/2020   HDL 54.00 08/09/2020   LDLDIRECT 218.0 08/05/2017   LDLCALC 128 (H) 08/09/2020   ALT 14 08/09/2020   AST 12 08/09/2020   NA 140 08/09/2020   K 4.8 08/09/2020   CL 102 08/09/2020   CREATININE 0.69 08/09/2020   BUN 16 08/09/2020   CO2 33 (H) 08/09/2020   TSH 6.74 (H) 08/09/2020   HGBA1C 5.8 08/09/2020   MICROALBUR <0.7 08/10/2019    DG Bone Density  Result Date: 11/02/2019 EXAM: DUAL X-RAY ABSORPTIOMETRY (DXA) FOR BONE MINERAL DENSITY IMPRESSION: Dear Dr Tiffany Chang, Your patient Tiffany Chang completed a FRAX assessment on 11/02/2019 using the [place] (analysis version: 14.10) manufactured by [employer]. The following summarizes the results of our evaluation. PATIENT BIOGRAPHICAL: Name: Tiffany Chang Patient ID: [id number] Birth Date: 1945-09-01 Height:    67.5 in. Gender:     Female    [age]       Weight:    159.0 lbs. Ethnicity:  White                            Exam Date: 11/02/2019 FRAX* RESULTS:  (version: 3.5) 10-year Probability of Fracture1 Major  Osteoporotic Fracture2 Hip Fracture 10.8% 1.9% Population: [place] (Caucasian) Risk Factors: None Based on Femur (Right) Neck BMD 1 -The 10-year probability of fracture may be lower than reported if the patient has received treatment. 2 -Major Osteoporotic Fracture: Clinical Spine, Forearm, Hip or Shoulder *FRAX is a Materials engineer of the [employer] for Metabolic Bone Disease, a [place] (WHO) [facility]. ASSESSMENT: The probability of a major osteoporotic fracture is 10.8% within the next ten years. The probability of a hip fracture is 1.9% within the next ten years. . Your patient Danette Weinfeld completed a BMD test on 11/02/2019 using the [place] (software version: 14.10) manufactured by [employer]. The following summarizes the results of our evaluation. Technologist: [facility] PATIENT BIOGRAPHICAL: Name: Kimm, Sider Patient  ID: [id number] Birth Date: 11-May-1946 Height: 67.5 in. Gender: Female Exam Date: 11/02/2019 Weight: 159.0 lbs. Indications: Height Loss, Postmenopausal, Hypothyroid, Advanced Age, Osteopenia, Vitamin D Deficiency, History of Breast Cancer, High Risk Meds, Caucasian, Family Hx of Osteoporosis Fractures: Treatments: Calcium, Letrozole, Synthroid, Vit D DENSITOMETRY RESULTS: Site      Region     Measured Date Measured Age WHO Classification Young Adult T-score BMD         %Change vs. Previous Significant Change (*) AP Spine L1-L3 11/02/2019 71.7 Osteopenia -1.4 1.012 g/cm2 -2.3% - AP Spine L1-L3 11/01/2018 70.7 Osteopenia -1.2 1.036 g/cm2 -0.5% - AP Spine L1-L3 10/26/2017 69.7 Osteopenia -1.1 1.041 g/cm2 0.3% - AP Spine L1-L3 10/23/2016 68.7 Osteopenia -1.2 1.038 g/cm2 -5.6% Yes AP Spine L1-L3 10/23/2015 67.7 Normal -0.7 1.099 g/cm2 -0.2% - AP Spine L1-L3 10/31/2014 66.7 Normal -0.7 1.101 g/cm2 -0.5% - AP Spine L1-L3 08/31/2013 65.5 Normal -0.6 1.107 g/cm2 0.2% - AP Spine L1-L3 02/27/2010 62.0 Normal -0.6 1.105 g/cm2 - - DualFemur  Neck Right 11/02/2019 71.7 Osteopenia -1.6 0.816 g/cm2 6.7% - DualFemur Neck Right 11/01/2018 70.7 Osteopenia -2.0 0.765 g/cm2 -0.5% - DualFemur Neck Right 10/26/2017 69.7 Osteopenia -1.9 0.769 g/cm2 -0.6% - DualFemur Neck Right 10/23/2016 68.7 Osteopenia -1.9 0.774 g/cm2 -4.1% - DualFemur Neck Right 10/23/2015 67.7 Osteopenia -1.7 0.807 g/cm2 0.2% - DualFemur Neck Right 10/31/2014 66.7 Osteopenia -1.7 0.805 g/cm2 1.5% - DualFemur Neck Right 02/27/2010 62.0 Osteopenia -1.8 0.793 g/cm2 - - DualFemur Total Mean 11/02/2019 71.7 Osteopenia -1.2 0.853 g/cm2 0.8% - DualFemur Total Mean 11/01/2018 70.7 Osteopenia -1.3 0.846 g/cm2 -0.5% - DualFemur Total Mean 10/26/2017 69.7 Osteopenia -1.3 0.850 g/cm2 -1.5% - DualFemur Total Mean 10/23/2016 68.7 Osteopenia -1.1 0.863 g/cm2 -2.6% - DualFemur Total Mean 10/23/2015 67.7 Normal -1.0 0.886 g/cm2 -0.1% - DualFemur Total Mean 10/31/2014 66.7 Normal -1.0 0.887 g/cm2 0.6% - DualFemur Total Mean 02/27/2010 62.0 Normal -1.0 0.882 g/cm2 - - ASSESSMENT: The BMD measured at Femur Neck Right is 0.816 g/cm2 with a T-score of -1.6. This patient is considered osteopenic according to [facility]) criteria. The scan quality is good. L-4 was excluded due to degenerative changes. Compared with prior study, there has been no significant change in the spine. Compared with prior study, there has been no significant change in the total hip. World Pharmacologist [facility]) criteria for post-menopausal, Caucasian Women: Normal:                   T-score at or above -1 SD Osteopenia/low bone mass: T-score between -1 and -2.5 SD Osteoporosis:             T-score at or below -2.5 SD RECOMMENDATIONS: 1. All patients should optimize calcium and vitamin D intake. 2. Consider FDA-approved medical therapies in postmenopausal women and men aged 45 years and older, based on the following: a. A hip or vertebral(clinical or morphometric) fracture b. T-score < -2.5 at the femoral neck or spine  after appropriate evaluation to exclude secondary causes c. Low bone mass (T-score between -1.0 and -2.5 at the femoral neck or spine) and a 10-year probability of a hip fracture > 3% or a 10-year probability of a major osteoporosis-related fracture > 20% based on the [place]-adapted WHO algorithm 3. Clinician judgment and/or patient preferences may indicate treatment for people with 10-year fracture probabilities above or below these levels FOLLOW-UP: People with diagnosed cases of osteoporosis or at high risk for fracture should have regular bone mineral density tests. For patients eligible for Medicare, routine testing  is allowed once every 2 years. The testing frequency can be increased to one year for patients who have rapidly progressing disease, those who are receiving or discontinuing medical therapy to restore bone mass, or have additional risk factors. I have reviewed this report, and agree with the above findings. [facility] Electronically Signed   By: Lowella Grip III M.D.   On: 11/02/2019 11:11   MM 3D SCREEN BREAST UNI LEFT  Result Date: 11/03/2019 CLINICAL DATA:  Screening. EXAM: DIGITAL SCREENING UNILATERAL LEFT MAMMOGRAM WITH CAD AND TOMO COMPARISON:  Previous exam(s). ACR Breast Density Category c: The breast tissue is heterogeneously dense, which may obscure small masses. FINDINGS: The patient has had a right mastectomy. There are no findings suspicious for malignancy. Images were processed with CAD. IMPRESSION: No mammographic evidence of malignancy. A result letter of this screening mammogram will be mailed directly to the patient. RECOMMENDATION: Screening mammogram in one year.  (Code:SM-L-41M) BI-RADS CATEGORY  1: Negative. Electronically Signed   By: Ammie Ferrier M.D.   On: 11/03/2019 13:32    Assessment & Plan:   Problem List Items Addressed This Visit      Unprioritized   Breast cancer of upper-outer quadrant of right female breast ([place])    Stopped  letrozole in December.  Larna Daughters in June. Wants to talk to him about genetic testing       Tubular adenoma of colon    5 yr follow up is due in 2026       Acquired hypothyroidism - Primary    Thyroid function is underactive on current dose of 88 mcg daily . Dose increased to 100 mcg daily.  Repeat tsh in 6 weeks.   Lab Results  Component Value Date   TSH 6.74 (H) 08/09/2020         Relevant Orders   TSH (Completed)   Vitamin D deficiency   Relevant Orders   VITAMIN D 25 Hydroxy (Vit-D Deficiency, Fractures) (Completed)   Hyperlipidemia    Managed with rovustatin without adverse effects for 10 yr risk of CAD estimated at 23% using the FRC . LDL is < 130  And may improve with more active thyroid.  LFTS are normal.  No change to regimen today.  Lab Results  Component Value Date   CHOL 209 (H) 08/09/2020   HDL 54.00 08/09/2020   LDLCALC 128 (H) 08/09/2020   LDLDIRECT 218.0 08/05/2017   TRIG 135.0 08/09/2020   CHOLHDL 4 08/09/2020   Lab Results  Component Value Date   ALT 14 08/09/2020   AST 12 08/09/2020   ALKPHOS 83 08/09/2020   BILITOT 0.4 08/09/2020         Relevant Orders   Lipid panel (Completed)   Tremor    Managed with primidone.  Mild .  Her daughter has chosen a lot of "finger foods" for her wedding reception to ease her apprehension about using silverware in public.       Prediabetes    She has lowered her a1c from 6.0 to 5.8 with a low glycemic index diet and participation regularly in an aerobic  Exercise program   Lab Results  Component Value Date   HGBA1C 5.8 08/09/2020         Relevant Orders   Hemoglobin A1c (Completed)   Comprehensive metabolic panel (Completed)    Other Visit Diagnoses    COVID-19 vaccine administered       Relevant Orders   SARS-CoV-2 Semi-Quantitative Total Antibody, Spike  I am having William Dalton maintain her naproxen sodium, Vitamin D3, ALPRAZolam, and primidone.  No orders of the defined types were  placed in this encounter.   There are no discontinued medications.  Follow-up: Return in about 6 months (around 02/09/2021).   Crecencio Mc, MD

## 2020-08-09 NOTE — Assessment & Plan Note (Addendum)
Stopped letrozole in December.  Larna Daughters in June. Wants to talk to him about genetic testing

## 2020-08-10 ENCOUNTER — Other Ambulatory Visit: Payer: Self-pay | Admitting: Neurology

## 2020-08-10 ENCOUNTER — Other Ambulatory Visit: Payer: Self-pay | Admitting: Internal Medicine

## 2020-08-10 NOTE — Telephone Encounter (Signed)
Patient notified and voiced understanding.

## 2020-08-10 NOTE — Telephone Encounter (Signed)
Due to lab results of TSH 6.74 delayed refilling Levothyroxine until PCP could review.

## 2020-08-10 NOTE — Telephone Encounter (Signed)
Rx(s) sent to pharmacy electronically.  

## 2020-08-10 NOTE — Telephone Encounter (Signed)
Dose increased to 100 mcg daily for tsh of 6/.6

## 2020-08-11 NOTE — Assessment & Plan Note (Signed)
Thyroid function is underactive on current dose of 88 mcg daily . Dose increased to 100 mcg daily.  Repeat tsh in 6 weeks.   Lab Results  Component Value Date   TSH 6.74 (H) 08/09/2020

## 2020-08-11 NOTE — Assessment & Plan Note (Addendum)
Managed with rovustatin without adverse effects for 10 yr risk of CAD estimated at 23% using the FRC . LDL is < 130  And may improve with more active thyroid.  LFTS are normal.  No change to regimen today.  Lab Results  Component Value Date   CHOL 209 (H) 08/09/2020   HDL 54.00 08/09/2020   LDLCALC 128 (H) 08/09/2020   LDLDIRECT 218.0 08/05/2017   TRIG 135.0 08/09/2020   CHOLHDL 4 08/09/2020   Lab Results  Component Value Date   ALT 14 08/09/2020   AST 12 08/09/2020   ALKPHOS 83 08/09/2020   BILITOT 0.4 08/09/2020

## 2020-08-11 NOTE — Assessment & Plan Note (Signed)
She has lowered her a1c from 6.0 to 5.8 with a low glycemic index diet and participation regularly in an aerobic  Exercise program   Lab Results  Component Value Date   HGBA1C 5.8 08/09/2020

## 2020-08-11 NOTE — Assessment & Plan Note (Signed)
5 yr follow up is due in 2026

## 2020-08-11 NOTE — Assessment & Plan Note (Addendum)
Managed with primidone.  Mild .  Her daughter has chosen a lot of "finger foods" for her wedding reception to ease her apprehension about using silverware in public.

## 2020-08-14 LAB — SARS-COV-2 SEMI-QUANTITATIVE TOTAL ANTIBODY, SPIKE: SARS COV2 AB, Total Spike Semi QN: >2500 U/mL — ABNORMAL HIGH (ref <0.8)

## 2020-09-07 ENCOUNTER — Other Ambulatory Visit: Payer: Self-pay | Admitting: Neurology

## 2020-09-24 NOTE — Progress Notes (Signed)
Assessment/Plan:    1.  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  She is not ready for that right now.  We have discussed both DBS and FUS and offered surgical second opinions at [facility] as well.  -Continue propranolol, 40 mg twice per day.  -Primidone, 50 mg, 4 tablets twice per day.  Discussed whether or not to increase to 250 mg bid and she wants to hold for right now  -discussed 2nd line meds.  -pt with several questions today and answered those to best of my ability.  Subjective:   Tiffany Chang was seen today in follow up for essential tremor.  My previous records were reviewed prior to todays visit. She is noting continued tremor.  She did get through her daughters wedding well.  She isn't debilitated by her tremor although writing is very difficult.  "I have better days and not as better days."  She is not ready for surgery.  Pt denies falls.  Pt denies lightheadedness, near syncope.  No hallucinations.  Mood has been good.  Current prescribed movement disorder medications: Primidone, 50 mg, 4 tablets twice per day  Propranolol, 40 mg twice per day     ALLERGIES:   Allergies  Allergen Reactions  . Statins Other (See Comments)     Leg cramps in high doses  . Bee Venom Hives    CURRENT MEDICATIONS:  Outpatient Encounter Medications as of 09/27/2020  Medication Sig  . ALPRAZolam (XANAX) 0.25 MG tablet Take 1 tablet (0.25 mg total) by mouth 2 (two) times daily as needed for anxiety.  . Cholecalciferol (VITAMIN D3) 50 MCG (2000 UT) TABS Take 1 tablet by mouth daily.   Marland Kitchen levothyroxine (SYNTHROID) 100 MCG tablet Take 1 tablet (100 mcg total) by mouth daily.  . naproxen sodium (ALEVE) 220 MG tablet Take 220 mg by mouth 2 (two) times daily as needed (pain).  . primidone (MYSOLINE) 50 MG tablet Take 4 tablets (200 mg total) by mouth in the morning and at bedtime.   . propranolol (INDERAL) 40 MG tablet TAKE 1 TABLET BY MOUTH TWICE A DAY  . rosuvastatin (CRESTOR) 5 MG tablet TAKE 1 TABLET BY MOUTH EVERY DAY   No facility-administered encounter medications on file as of 09/27/2020.     Objective:    PHYSICAL EXAMINATION:    VITALS:   Vitals:   09/27/20 1041  BP: 108/62  Pulse: 72  SpO2: 98%  Weight: 165 lb (74.8 kg)  Height: 5\' 7"  (1.702 m)    GEN:  The patient appears stated age and is in NAD. HEENT:  Normocephalic, atraumatic.  The mucous membranes are moist. The superficial temporal arteries are without ropiness or tenderness. CV:  RRR Lungs:  CTAB Neck/HEME:  There are no carotid bruits bilaterally.  Neurological examination:  Orientation: The patient is alert and oriented x3. Cranial nerves: There is good facial symmetry. The speech is fluent and clear. Soft palate rises symmetrically and there is no tongue deviation. Hearing is intact to conversational tone. Sensation: Sensation is intact to light touch throughout Motor: Strength is at least antigravity x4.  Movement examination: Tone: There is normal tone in the UE/LE Abnormal movements: no rest tremor.  No postural tremor.  Has mod intention tremor.    I have reviewed and interpreted the following labs independently   Chemistry      Component Value  Date/Time   NA 140 08/09/2020 0923   K 4.8 08/09/2020 0923   CL 102 08/09/2020 0923   CO2 33 (H) 08/09/2020 0923   BUN 16 08/09/2020 0923   CREATININE 0.69 08/09/2020 0923      Component Value Date/Time   CALCIUM 9.6 08/09/2020 0923   ALKPHOS 83 08/09/2020 0923   AST 12 08/09/2020 0923   ALT 14 08/09/2020 0923   BILITOT 0.4 08/09/2020 0923      Lab Results  Component Value Date   WBC 6.2 08/10/2019   HGB 14.2 08/10/2019   HCT 42.1 08/10/2019   MCV 88.8 08/10/2019   PLT 320.0 08/10/2019   Lab Results  Component Value Date   TSH 6.74 (H) 08/09/2020     Chemistry      Component Value Date/Time   NA 140  08/09/2020 0923   K 4.8 08/09/2020 0923   CL 102 08/09/2020 0923   CO2 33 (H) 08/09/2020 0923   BUN 16 08/09/2020 0923   CREATININE 0.69 08/09/2020 0923      Component Value Date/Time   CALCIUM 9.6 08/09/2020 0923   ALKPHOS 83 08/09/2020 0923   AST 12 08/09/2020 0923   ALT 14 08/09/2020 0923   BILITOT 0.4 08/09/2020 0923         Total time spent on today's visit was 20 minutes, including both face-to-face time and nonface-to-face time.  Time included that spent on review of records (prior notes available to me/labs/imaging if pertinent), discussing treatment and goals, answering patient's questions and coordinating care.  Cc:  Crecencio Mc, MD

## 2020-09-27 ENCOUNTER — Other Ambulatory Visit (INDEPENDENT_AMBULATORY_CARE_PROVIDER_SITE_OTHER): Payer: Medicare Other

## 2020-09-27 ENCOUNTER — Other Ambulatory Visit: Payer: Self-pay

## 2020-09-27 ENCOUNTER — Encounter: Payer: Self-pay | Admitting: Neurology

## 2020-09-27 ENCOUNTER — Ambulatory Visit (INDEPENDENT_AMBULATORY_CARE_PROVIDER_SITE_OTHER): Payer: Medicare Other | Admitting: Neurology

## 2020-09-27 VITALS — BP 108/62 | HR 72 | Ht 67.0 in | Wt 165.0 lb

## 2020-09-27 DIAGNOSIS — E039 Hypothyroidism, unspecified: Secondary | ICD-10-CM

## 2020-09-27 DIAGNOSIS — G250 Essential tremor: Secondary | ICD-10-CM | POA: Diagnosis not present

## 2020-09-27 DIAGNOSIS — E782 Mixed hyperlipidemia: Secondary | ICD-10-CM

## 2020-09-27 LAB — LIPID PANEL
Cholesterol: 184 mg/dL (ref 0–200)
HDL: 44.3 mg/dL (ref >39.00)
LDL Cholesterol: 116 mg/dL — ABNORMAL HIGH (ref 0–99)
NonHDL: 139.29
Total CHOL/HDL Ratio: 4
Triglycerides: 118 mg/dL (ref 0.0–149.0)
VLDL: 23.6 mg/dL (ref 0.0–40.0)

## 2020-09-27 NOTE — Patient Instructions (Signed)

## 2020-09-28 LAB — THYROID PANEL WITH TSH
Free Thyroxine Index: 2.2 (ref 1.4–3.8)
T3 Uptake: 30 % (ref 22–35)
T4, Total: 7.3 ug/dL (ref 5.1–11.9)
TSH: 3.96 mIU/L (ref 0.40–4.50)

## 2020-10-04 ENCOUNTER — Ambulatory Visit: Payer: Medicare Other | Admitting: Neurology

## 2020-10-08 MED ORDER — DOXYCYCLINE HYCLATE 100 MG PO TABS: 100.0000 mg | ORAL_TABLET | Freq: Two times a day (BID) | ORAL | 1 refills | Status: DC

## 2020-10-22 MED ORDER — PRIMIDONE 250 MG PO TABS: 250.0000 mg | ORAL_TABLET | Freq: Two times a day (BID) | ORAL | 1 refills | Status: DC

## 2020-11-06 ENCOUNTER — Other Ambulatory Visit: Payer: Medicare Other

## 2020-11-06 ENCOUNTER — Ambulatory Visit: Payer: Medicare Other | Admitting: Oncology

## 2020-11-10 NOTE — Progress Notes (Signed)
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  INTERVAL HISTORY: Patient returns to clinic today for further evaluation and discussion of her mammogram and bone density results.  She continues to feel well and remains asymptomatic.  She has no neurologic complaints.  She denies any recent fevers or illnesses.  She has a good appetite and denies weight loss.  She denies any chest pain, shortness of breath, cough, or hemoptysis.  She denies any nausea, vomiting, constipation, or diarrhea. She has no urinary complaints.  Patient offers no specific complaints today.  REVIEW OF SYSTEMS:   Review of Systems  Constitutional: Negative.  Negative for fever, malaise/fatigue and weight loss.  Respiratory: Negative.  Negative for cough and shortness of breath.   Cardiovascular: Negative.  Negative for chest pain and leg swelling.  Gastrointestinal: Negative.  Negative for abdominal pain.  Genitourinary: Negative.  Negative for dysuria.  Musculoskeletal: Negative.  Negative for back pain.  Skin: Negative.  Negative for rash.  Neurological: Negative.  Negative for sensory change, focal weakness, weakness and headaches.  Psychiatric/Behavioral: Negative.  The patient is not nervous/anxious.    As per HPI. Otherwise, a complete review of systems is negative.  PAST MEDICAL HISTORY: Past Medical History:  Diagnosis Date   Breast cancer ([place]) 2016   [place] - RT MASTECTOMY   Hashimoto's thyroiditis    History of arthroscopy of left knee 1985   Hyperlipidemia    mild, with prior statin intolerance   Hypothyroid    Osteoporosis    prior  Evista use x 5 yrs , 2007   Spinal stenosis    mild   Syncope, vasovagal    3 episodes   Tremor    Viral meningitis    75 years old    PAST SURGICAL HISTORY: Past Surgical History:  Procedure Laterality Date   CATARACT EXTRACTION W/PHACO Left 09/30/2017   Procedure: CATARACT EXTRACTION PHACO AND INTRAOCULAR LENS PLACEMENT ([place]);  Surgeon: Birder Robson, MD;  Location: [facility];  Service: Ophthalmology;  Laterality: Left;  [place] 00:40 AP% 15.7 CDE 6.38 Fluid pack lot # [id number]   CATARACT EXTRACTION W/PHACO Right 06/07/2019   Procedure: CATARACT EXTRACTION PHACO AND INTRAOCULAR LENS PLACEMENT ([facility]) RIGHT 4.62 00:33.5;  Surgeon: Birder Robson, MD;  Location: [place];  Service: Ophthalmology;  Laterality: Right;   CESAREAN SECTION     CHOLECYSTECTOMY  2006   COLONOSCOPY WITH PROPOFOL N/A 09/26/2019   Procedure: COLONOSCOPY WITH BIOPSY;  Surgeon: Lucilla Lame, MD;  [place];  Service: Endoscopy;  Laterality: N/A;  priority 3   FOOT SURGERY     plantar fascitis, Dr. Elvina Mattes   KNEE ARTHROSCOPY  1985   right   MASTECTOMY Right 2016   [place]   POLYPECTOMY N/A 09/26/2019   Procedure: POLYPECTOMY;  Surgeon: Lucilla Lame, MD;  Location: [place];  Service: Endoscopy;  Laterality: N/A;   SENTINEL NODE BIOPSY Right 10/10/2014   Procedure: SENTINEL NODE BIOPSY;  Surgeon: Leonie Green, MD;  Location: [facility];  Service: General;  Laterality: Right;   SIMPLE MASTECTOMY WITH AXILLARY SENTINEL NODE BIOPSY Right 10/10/2014   Procedure: SIMPLE MASTECTOMY;  Surgeon: Leonie Green, MD;  Location: [facility];  Service: General;  Laterality: Right;   TONSILLECTOMY      FAMILY HISTORY Family History  Problem Relation Age of Onset   Osteoporosis Mother    Aortic aneurysm Father    Cancer Sister 38       Breast   Breast cancer Sister 56   Cancer Maternal Grandfather        breast   Breast cancer Maternal Grandmother 86       ADVANCED  DIRECTIVES:    HEALTH MAINTENANCE: Social History   Tobacco Use   Smoking status: Never   Smokeless tobacco: Never  Vaping Use   Vaping Use: Never used  Substance Use Topics   Alcohol use: Yes    Alcohol/week: 3.0 standard drinks    Types: 3 Standard drinks or equivalent per week    Comment: social, 2 times per week   Drug use: No     Colonoscopy:  PAP:  Bone density:  Lipid panel:  Allergies  Allergen Reactions   Statins Other (See Comments)     Leg cramps in high doses   Bee Venom Hives    Current Outpatient Medications  Medication Sig Dispense Refill   ALPRAZolam (XANAX) 0.25 MG tablet Take 1 tablet (0.25 mg total) by mouth 2 (two) times daily as needed for anxiety. 20 tablet 0   Cholecalciferol (VITAMIN D3) 50 MCG (2000 UT) TABS Take 1 tablet by mouth daily.      levothyroxine (SYNTHROID) 100 MCG tablet Take 1 tablet (100 mcg total) by mouth daily. 90 tablet 1   naproxen sodium (ALEVE) 220 MG tablet Take 220 mg by mouth 2 (two) times daily as needed (pain).     primidone (MYSOLINE) 250 MG tablet Take 1 tablet (250 mg total) by mouth in the morning and at bedtime. 180 tablet 1   propranolol (INDERAL) 40 MG tablet TAKE 1 TABLET BY MOUTH TWICE A DAY 120 tablet 2   rosuvastatin (CRESTOR) 5 MG tablet TAKE 1 TABLET BY MOUTH EVERY DAY 90 tablet 1   doxycycline (VIBRA-TABS) 100 MG tablet Take 1 tablet (100 mg total) by mouth 2 (two) times daily. (Patient not taking: Reported on 11/15/2020) 20 tablet 1   No current facility-administered medications for this visit.    OBJECTIVE: Vitals:   11/15/20 1025  BP: 119/78  Pulse: (!) 16  Resp: 16  Temp: 97.6 F (36.4 C)     Body mass index is 25.97 kg/m.    ECOG FS:0 - Asymptomatic  General: Well-developed, well-nourished, no acute distress. Eyes: Pink conjunctiva, anicteric sclera. HEENT: Normocephalic, moist mucous membranes. Breast: Exam deferred today. Lungs: No audible wheezing or coughing. Heart: Regular rate and  rhythm. Abdomen: Soft, nontender, no obvious distention. Musculoskeletal: No edema, cyanosis, or clubbing. Neuro: Alert, answering all questions appropriately. Cranial nerves grossly intact. Skin: No rashes or petechiae noted. Psych: Normal affect.   LAB RESULTS:  Lab Results  Component Value Date   NA 140 08/09/2020   K 4.8 08/09/2020   CL 102 08/09/2020   CO2 33 (H) 08/09/2020   GLUCOSE 107 (H) 08/09/2020   BUN 16 08/09/2020   CREATININE 0.69 08/09/2020   CALCIUM 9.6 08/09/2020   PROT 7.0 08/09/2020   ALBUMIN 4.6 08/09/2020   AST 12 08/09/2020   ALT 14 08/09/2020   ALKPHOS 83 08/09/2020   BILITOT 0.4 08/09/2020   GFRNONAA >60 10/03/2014   GFRAA >60 10/03/2014    Lab Results  Component Value Date   WBC 6.2 08/10/2019  NEUTROABS 4.1 08/10/2019   HGB 14.2 08/10/2019   HCT 42.1 08/10/2019   MCV 88.8 08/10/2019   PLT 320.0 08/10/2019     STUDIES: DG Bone Density  Result Date: 11/12/2020 EXAM: DUAL X-RAY ABSORPTIOMETRY (DXA) FOR BONE MINERAL DENSITY IMPRESSION: Dear Dr. Grayland Ormond, Your patient Tiffany Chang completed a FRAX assessment on 11/12/2020 using the [place] (analysis version: 14.10) manufactured by [employer]. The following summarizes the results of our evaluation. PATIENT BIOGRAPHICAL: Name: Tiffany, Chang Patient ID: [id number] Birth Date: Aug 01, 1945 Height:    67.0 in. Gender:     Female    [age]       Weight:    167.4 lbs. Ethnicity:  White                            Exam Date: 11/12/2020 FRAX* RESULTS:  (version: 3.5) 10-year Probability of Fracture1 Major Osteoporotic Fracture2 Hip Fracture 11.4% 2.2% Population: [place] (Caucasian) Risk Factors: None Based on Femur (Right) Neck BMD 1 -The 10-year probability of fracture may be lower than reported if the patient has received treatment. 2 -Major Osteoporotic Fracture: Clinical Spine, Forearm, Hip or Shoulder *FRAX is a Materials engineer of the [employer] for Metabolic  Bone Disease, a [place] (WHO) [facility]. ASSESSMENT: The probability of a major osteoporotic fracture is 11.4% within the next ten years. The probability of a hip fracture is 2.2% within the next ten years. . Your patient Tiffany Chang completed a BMD test on 11/12/2020 using the [place] (software version: 14.10) manufactured by [employer]. The following summarizes the results of our evaluation. Technologist: [facility] PATIENT BIOGRAPHICAL: Name: Tiffany, Chang Patient ID: [id number] Birth Date: [birth year] Height: 67.0 in. Gender: Female Exam Date: 11/12/2020 Weight: 167.4 lbs. Indications: Advanced Age, Caucasian, Family Hx of Osteoporosis, Height Loss, History of Breast Cancer, Hypothyroid, Postmenopausal Fractures: Treatments: Calcium, Synthroid, Vit D DENSITOMETRY RESULTS: Site      Region     Measured Date Measured Age WHO Classification Young Adult T-score BMD         %Change vs. Previous Significant Change (*) AP Spine L1-L3 11/12/2020 72.7 Osteopenia -1.4 1.006 g/cm2 -0.6% - AP Spine L1-L3 11/02/2019 71.7 Osteopenia -1.4 1.012 g/cm2 -2.3% - AP Spine L1-L3 11/01/2018 70.7 Osteopenia -1.2 1.036 g/cm2 -0.5% - AP Spine L1-L3 10/26/2017 69.7 Osteopenia -1.1 1.041 g/cm2 0.3% - AP Spine L1-L3 10/23/2016 68.7 Osteopenia -1.2 1.038 g/cm2 -5.6% Yes AP Spine L1-L3 10/23/2015 67.7 Normal -0.7 1.099 g/cm2 -0.2% - AP Spine L1-L3 10/31/2014 66.7 Normal -0.7 1.101 g/cm2 -0.5% - AP Spine L1-L3 08/31/2013 65.5 Normal -0.6 1.107 g/cm2 0.2% - AP Spine L1-L3 02/27/2010 62.0 Normal -0.6 1.105 g/cm2 - - DualFemur Neck Right 11/12/2020 72.7 Osteopenia -1.7 0.805 g/cm2 -1.3% - DualFemur Neck Right 11/02/2019 71.7 Osteopenia -1.6 0.816 g/cm2 6.7% - DualFemur Neck Right 11/01/2018 70.7 Osteopenia -2.0 0.765 g/cm2 -0.5% - DualFemur Neck Right 10/26/2017 69.7 Osteopenia -1.9 0.769 g/cm2 -0.6% - DualFemur Neck Right 10/23/2016 68.7 Osteopenia -1.9 0.774 g/cm2 -4.1% - DualFemur Neck Right 10/23/2015  67.7 Osteopenia -1.7 0.807 g/cm2 0.2% - DualFemur Neck Right 10/31/2014 66.7 Osteopenia -1.7 0.805 g/cm2 1.5% - DualFemur Neck Right 02/27/2010 62.0 Osteopenia -1.8 0.793 g/cm2 - - DualFemur Total Mean 11/12/2020 72.7 Osteopenia -1.3 0.849 g/cm2 -0.5% - DualFemur Total Mean 11/02/2019 71.7 Osteopenia -1.2 0.853 g/cm2 0.8% - DualFemur Total Mean 11/01/2018  70.7 Osteopenia -1.3 0.846 g/cm2 -0.5% - DualFemur Total Mean 10/26/2017 69.7 Osteopenia -1.3 0.850 g/cm2 -1.5% - DualFemur Total Mean 10/23/2016 68.7 Osteopenia -1.1 0.863 g/cm2 -2.6% Yes DualFemur Total Mean 10/23/2015 67.7 Normal -1.0 0.886 g/cm2 -0.1% - DualFemur Total Mean 10/31/2014 66.7 Normal -1.0 0.887 g/cm2 0.6% - DualFemur Total Mean 02/27/2010 62.0 Normal -1.0 0.882 g/cm2 - - ASSESSMENT: The BMD measured at Femur Neck Right is 0.805 g/cm2 with a T-score of -1.7. This patient is considered osteopenic according to [facility]) criteria. The scan quality is good. L-4 was excluded due to degenerative changes. Compared with prior study, there has been no significant change in the spine. Compared with prior study, there has been no significant change in the total hip. World Pharmacologist [facility]) criteria for post-menopausal, Caucasian Women: Normal:                   T-score at or above -1 SD Osteopenia/low bone mass: T-score between -1 and -2.5 SD Osteoporosis:             T-score at or below -2.5 SD RECOMMENDATIONS: 1. All patients should optimize calcium and vitamin D intake. 2. Consider FDA-approved medical therapies in postmenopausal women and men aged 19 years and older, based on the following: a. A hip or vertebral(clinical or morphometric) fracture b. T-score < -2.5 at the femoral neck or spine after appropriate evaluation to exclude secondary causes c. Low bone mass (T-score between -1.0 and -2.5 at the femoral neck or spine) and a 10-year probability of a hip fracture > 3% or a 10-year probability of a major  osteoporosis-related fracture > 20% based on the [place]-adapted WHO algorithm 3. Clinician judgment and/or patient preferences may indicate treatment for people with 10-year fracture probabilities above or below these levels FOLLOW-UP: People with diagnosed cases of osteoporosis or at high risk for fracture should have regular bone mineral density tests. For patients eligible for Medicare, routine testing is allowed once every 2 years. The testing frequency can be increased to one year for patients who have rapidly progressing disease, those who are receiving or discontinuing medical therapy to restore bone mass, or have additional risk factors. I have reviewed this report, and agree with the above findings. [facility] Electronically Signed   By: Lowella Grip III M.D.   On: 11/12/2020 16:05   MM 3D SCREEN BREAST UNI LEFT  Result Date: 11/13/2020 CLINICAL DATA:  Screening. EXAM: DIGITAL SCREENING UNILATERAL LEFT MAMMOGRAM WITH CAD AND TOMOSYNTHESIS TECHNIQUE: Left screening digital craniocaudal and mediolateral oblique mammograms were obtained. Left screening digital breast tomosynthesis was performed. The images were evaluated with computer-aided detection. COMPARISON:  Previous exam(s). ACR Breast Density Category c: The breast tissue is heterogeneously dense, which may obscure small masses. FINDINGS: There are no findings suspicious for malignancy. IMPRESSION: No mammographic evidence of malignancy. A result letter of this screening mammogram will be mailed directly to the patient. RECOMMENDATION: Screening mammogram in one year. (Code:SM-B-01Y) BI-RADS CATEGORY  1: Negative. Electronically Signed   By: Lajean Manes M.D.   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  PLAN:    1. Stage Ia ER/PR positive HER-2 negative invasive lobular adenocarcinoma of the upper outer quadrant  of the right breast: Patient ultimately decided to have a full mastectomy in May 2016 therefore she did not require adjuvant XRT. Given the fact that her malignancy is  lobular in nature, she did not require chemotherapy.  Patient has now completed 5 years of letrozole and treatment was discontinued.  Her most recent left unilateral screening mammogram on November 12, 2020 was reported as BI-RADS 1.  Repeat in June 2023.  Return to clinic in 1 year for routine evaluation.   2. Osteopenia: Patient's most recent bone mineral density on November 12, 2020 reported T score of -1.7 which is essentially unchanged from 1 year prior.  Continue calcium and vitamin D supplementation.  Repeat in June 2023 along with her mammogram as above. 3. Left breast abnormality: Patient had breast biopsy on August 21, 2015 that was negative for malignancy. Continue mammograms as above.     Patient expressed understanding and was in agreement with this plan. She also understands that She can call clinic at any time with any questions, concerns, or complaints.    Lloyd Huger, MD   11/16/2020 3:28 PM

## 2020-11-12 ENCOUNTER — Other Ambulatory Visit: Payer: Self-pay

## 2020-11-12 ENCOUNTER — Ambulatory Visit
Admission: RE | Admit: 2020-11-12 | Discharge: 2020-11-12 | Disposition: A | Discharge status: Home / Self Care | Payer: Medicare Other | Source: Ambulatory Visit | Attending: Oncology | Admitting: Oncology

## 2020-11-12 DIAGNOSIS — Z853 Personal history of malignant neoplasm of breast: Secondary | ICD-10-CM | POA: Diagnosis not present

## 2020-11-12 DIAGNOSIS — Z13820 Encounter for screening for osteoporosis: Secondary | ICD-10-CM | POA: Diagnosis present

## 2020-11-12 DIAGNOSIS — Z1231 Encounter for screening mammogram for malignant neoplasm of breast: Secondary | ICD-10-CM | POA: Diagnosis not present

## 2020-11-12 DIAGNOSIS — Z170 Estrogen receptor positive status [ER+]: Secondary | ICD-10-CM | POA: Diagnosis not present

## 2020-11-12 DIAGNOSIS — C50411 Malignant neoplasm of upper-outer quadrant of right female breast: Secondary | ICD-10-CM

## 2020-11-12 DIAGNOSIS — Z780 Asymptomatic menopausal state: Secondary | ICD-10-CM | POA: Diagnosis not present

## 2020-11-15 ENCOUNTER — Encounter: Payer: Self-pay | Admitting: Oncology

## 2020-11-15 ENCOUNTER — Inpatient Hospital Stay: Payer: Medicare Other | Attending: Oncology | Admitting: Oncology

## 2020-11-15 VITALS — BP 119/78 | HR 16 | Temp 97.6°F | Resp 16 | Wt 165.8 lb

## 2020-11-15 DIAGNOSIS — E785 Hyperlipidemia, unspecified: Secondary | ICD-10-CM | POA: Diagnosis not present

## 2020-11-15 DIAGNOSIS — Z9011 Acquired absence of right breast and nipple: Secondary | ICD-10-CM | POA: Insufficient documentation

## 2020-11-15 DIAGNOSIS — M4800 Spinal stenosis, site unspecified: Secondary | ICD-10-CM | POA: Diagnosis not present

## 2020-11-15 DIAGNOSIS — Z79899 Other long term (current) drug therapy: Secondary | ICD-10-CM | POA: Diagnosis not present

## 2020-11-15 DIAGNOSIS — Z780 Asymptomatic menopausal state: Secondary | ICD-10-CM

## 2020-11-15 DIAGNOSIS — C50411 Malignant neoplasm of upper-outer quadrant of right female breast: Secondary | ICD-10-CM | POA: Diagnosis not present

## 2020-11-15 DIAGNOSIS — Z170 Estrogen receptor positive status [ER+]: Secondary | ICD-10-CM | POA: Diagnosis not present

## 2020-11-15 DIAGNOSIS — Z1231 Encounter for screening mammogram for malignant neoplasm of breast: Secondary | ICD-10-CM | POA: Diagnosis not present

## 2020-11-15 DIAGNOSIS — M8580 Other specified disorders of bone density and structure, unspecified site: Secondary | ICD-10-CM | POA: Insufficient documentation

## 2020-11-15 NOTE — Progress Notes (Signed)
Patient denies new problems/concerns today.   °

## 2020-12-31 ENCOUNTER — Other Ambulatory Visit: Payer: Self-pay | Admitting: Neurology

## 2021-01-22 ENCOUNTER — Other Ambulatory Visit: Payer: Self-pay | Admitting: Internal Medicine

## 2021-02-04 ENCOUNTER — Other Ambulatory Visit: Payer: Self-pay

## 2021-02-04 ENCOUNTER — Ambulatory Visit (INDEPENDENT_AMBULATORY_CARE_PROVIDER_SITE_OTHER): Payer: Medicare Other | Admitting: Internal Medicine

## 2021-02-04 ENCOUNTER — Encounter: Payer: Self-pay | Admitting: Internal Medicine

## 2021-02-04 VITALS — BP 124/66 | HR 73 | Temp 96.3°F | Ht 67.0 in | Wt 162.4 lb

## 2021-02-04 DIAGNOSIS — E782 Mixed hyperlipidemia: Secondary | ICD-10-CM | POA: Diagnosis not present

## 2021-02-04 DIAGNOSIS — E039 Hypothyroidism, unspecified: Secondary | ICD-10-CM | POA: Diagnosis not present

## 2021-02-04 DIAGNOSIS — G250 Essential tremor: Secondary | ICD-10-CM

## 2021-02-04 DIAGNOSIS — Z23 Encounter for immunization: Secondary | ICD-10-CM | POA: Diagnosis not present

## 2021-02-04 NOTE — Progress Notes (Signed)
Subjective:  Patient ID: Tiffany Chang, female    DOB: [birth year]  Age: 75 y.o. MRN: [id number]  CC: The primary encounter diagnosis was Need for immunization against influenza. Diagnoses of Mixed hyperlipidemia, Acquired hypothyroidism, and Benign essential tremor were also pertinent to this visit.  HPI Tiffany Chang presents for  Chief Complaint  Patient presents with   Follow-up    6 month follow up     This visit occurred during the SARS-CoV-2 public health emergency.  Safety protocols were in place, including screening questions prior to the visit, additional usage of staff PPE, and extensive cleaning of exam room while observing appropriate contact time as indicated for disinfecting solutions.   Daughter got married on April 8 at 36 in [place].   Patient going [place] a [place] which [place]   Feels great.  No history of COVID infections despite many in her neighborhood becoming sick at [facility].   Hand tremor getting worse.taking propranolol 40 mg bid. [facility] Mask Tat in November.  On primidone 250 mg bid as well. Has discussed options of DBS vs ultrasound guided thalamic procedure with Dr Tat.   Balance is fine,  no falls .  Finished PT for right knee last year which improved her strength , still doing the  exercises regularly     Outpatient Medications Prior to Visit  Medication Sig Dispense Refill   ALPRAZolam (XANAX) 0.25 MG tablet Take 1 tablet (0.25 mg total) by mouth 2 (two) times daily as needed for anxiety. 20 tablet 0   Cholecalciferol (VITAMIN D3) 50 MCG (2000 UT) TABS Take 1 tablet by mouth daily.      levothyroxine (SYNTHROID) 100 MCG tablet TAKE 1 TABLET BY MOUTH EVERY DAY 90 tablet 1   naproxen sodium (ALEVE) 220 MG tablet Take 220 mg by mouth 2 (two) times daily as needed (pain).     primidone (MYSOLINE) 250 MG tablet Take 1 tablet (250 mg total) by mouth in the morning and at bedtime. 180 tablet 1   propranolol  (INDERAL) 40 MG tablet TAKE 1 TABLET BY MOUTH TWICE A DAY 120 tablet 2   rosuvastatin (CRESTOR) 5 MG tablet TAKE 1 TABLET BY MOUTH EVERY DAY 90 tablet 1   triamcinolone cream (KENALOG) 0.5 % Apply topically.     doxycycline (VIBRA-TABS) 100 MG tablet Take 1 tablet (100 mg total) by mouth 2 (two) times daily. (Patient not taking: No sig reported) 20 tablet 1   No facility-administered medications prior to visit.    Review of Systems;  Patient denies headache, fevers, malaise, unintentional weight loss, skin rash, eye pain, sinus congestion and sinus pain, sore throat, dysphagia,  hemoptysis , cough, dyspnea, wheezing, chest pain, palpitations, orthopnea, edema, abdominal pain, nausea, melena, diarrhea, constipation, flank pain, dysuria, hematuria, urinary  Frequency, nocturia, numbness, tingling, seizures,  Focal weakness, Loss of consciousness,  Tremor, insomnia, depression, anxiety, and suicidal ideation.      Objective:  BP 124/66 (BP Location: Left Arm, Patient Position: Sitting, Cuff Size: Normal)   Pulse 73   Temp (!) 96.3 F (35.7 C) (Temporal)   Ht '5\' 7"'$  (1.702 m)   Wt 162 lb 6.4 oz (73.7 kg)   SpO2 99%   BMI 25.44 kg/m   BP Readings from Last 3 Encounters:  02/04/21 124/66  11/15/20 119/78  09/27/20 108/62    Wt Readings from Last 3 Encounters:  02/04/21 162 lb 6.4 oz (  73.7 kg)  11/15/20 165 lb 12.8 oz (75.2 kg)  09/27/20 165 lb (74.8 kg)    General appearance: alert, cooperative and appears stated age Ears: normal TM's and external ear canals both ears Throat: lips, mucosa, and tongue normal; teeth and gums normal Neck: no adenopathy, no carotid bruit, supple, symmetrical, trachea midline and thyroid not enlarged, symmetric, no tenderness/mass/nodules Back: symmetric, no curvature. ROM normal. No CVA tenderness. Lungs: clear to auscultation bilaterally Heart: regular rate and rhythm, S1, S2 normal, no murmur, click, rub or gallop Abdomen: soft, non-tender; bowel  sounds normal; no masses,  no organomegaly Pulses: 2+ and symmetric Skin: Skin color, texture, turgor normal. No rashes or lesions Lymph nodes: Cervical, supraclavicular, and axillary nodes normal.  Lab Results  Component Value Date   HGBA1C 5.8 08/09/2020   HGBA1C 6.0 02/10/2020   HGBA1C 6.0 08/09/2018    Lab Results  Component Value Date   CREATININE 0.69 08/09/2020   CREATININE 0.67 02/10/2020   CREATININE 0.66 08/10/2019    Lab Results  Component Value Date   WBC 6.2 08/10/2019   HGB 14.2 08/10/2019   HCT 42.1 08/10/2019   PLT 320.0 08/10/2019   GLUCOSE 107 (H) 08/09/2020   CHOL 184 09/27/2020   TRIG 118.0 09/27/2020   HDL 44.30 09/27/2020   LDLDIRECT 218.0 08/05/2017   LDLCALC 116 (H) 09/27/2020   ALT 14 08/09/2020   AST 12 08/09/2020   NA 140 08/09/2020   K 4.8 08/09/2020   CL 102 08/09/2020   CREATININE 0.69 08/09/2020   BUN 16 08/09/2020   CO2 33 (H) 08/09/2020   TSH 3.96 09/27/2020   HGBA1C 5.8 08/09/2020   MICROALBUR <0.7 08/10/2019    DG Bone Density  Result Date: 11/12/2020 EXAM: DUAL X-RAY ABSORPTIOMETRY (DXA) FOR BONE MINERAL DENSITY IMPRESSION: Dear Dr. Grayland Ormond, Your patient Tiffany Chang completed a FRAX assessment on 11/12/2020 using the [place] (analysis version: 14.10) manufactured by [employer]. The following summarizes the results of our evaluation. PATIENT BIOGRAPHICAL: Name: Tiffany Chang, Tiffany Chang Patient ID: [id number] Birth Date: 13-Jan-1946 Height:    67.0 in. Gender:     Female    [age]       Weight:    167.4 lbs. Ethnicity:  White                            Exam Date: 11/12/2020 FRAX* RESULTS:  (version: 3.5) 10-year Probability of Fracture1 Major Osteoporotic Fracture2 Hip Fracture 11.4% 2.2% Population: [place] (Caucasian) Risk Factors: None Based on Femur (Right) Neck BMD 1 -The 10-year probability of fracture may be lower than reported if the patient has received treatment. 2 -Major Osteoporotic Fracture: Clinical Spine, Forearm, Hip  or Shoulder *FRAX is a Materials engineer of the [employer] for Metabolic Bone Disease, a [place] (WHO) [facility]. ASSESSMENT: The probability of a major osteoporotic fracture is 11.4% within the next ten years. The probability of a hip fracture is 2.2% within the next ten years. . Your patient Tiffany Chang completed a BMD test on 11/12/2020 using the [place] (software version: 14.10) manufactured by [employer]. The following summarizes the results of our evaluation. Technologist: [facility] PATIENT BIOGRAPHICAL: Name: Tiffany, Chang Patient ID: [id number] Birth Date: 1945-09-08 Height: 67.0 in. Gender: Female Exam Date: 11/12/2020 Weight: 167.4 lbs. Indications: Advanced Age, Caucasian, Family Hx of Osteoporosis, Height Loss,  History of Breast Cancer, Hypothyroid, Postmenopausal Fractures: Treatments: Calcium, Synthroid, Vit D DENSITOMETRY RESULTS: Site      Region     Measured Date Measured Age WHO Classification Young Adult T-score BMD         %Change vs. Previous Significant Change (*) AP Spine L1-L3 11/12/2020 72.7 Osteopenia -1.4 1.006 g/cm2 -0.6% - AP Spine L1-L3 11/02/2019 71.7 Osteopenia -1.4 1.012 g/cm2 -2.3% - AP Spine L1-L3 11/01/2018 70.7 Osteopenia -1.2 1.036 g/cm2 -0.5% - AP Spine L1-L3 10/26/2017 69.7 Osteopenia -1.1 1.041 g/cm2 0.3% - AP Spine L1-L3 10/23/2016 68.7 Osteopenia -1.2 1.038 g/cm2 -5.6% Yes AP Spine L1-L3 10/23/2015 67.7 Normal -0.7 1.099 g/cm2 -0.2% - AP Spine L1-L3 10/31/2014 66.7 Normal -0.7 1.101 g/cm2 -0.5% - AP Spine L1-L3 08/31/2013 65.5 Normal -0.6 1.107 g/cm2 0.2% - AP Spine L1-L3 02/27/2010 62.0 Normal -0.6 1.105 g/cm2 - - DualFemur Neck Right 11/12/2020 72.7 Osteopenia -1.7 0.805 g/cm2 -1.3% - DualFemur Neck Right 11/02/2019 71.7 Osteopenia -1.6 0.816 g/cm2 6.7% - DualFemur Neck Right 11/01/2018 70.7 Osteopenia -2.0 0.765 g/cm2 -0.5% - DualFemur Neck Right 10/26/2017 69.7 Osteopenia -1.9 0.769 g/cm2 -0.6% -  DualFemur Neck Right 10/23/2016 68.7 Osteopenia -1.9 0.774 g/cm2 -4.1% - DualFemur Neck Right 10/23/2015 67.7 Osteopenia -1.7 0.807 g/cm2 0.2% - DualFemur Neck Right 10/31/2014 66.7 Osteopenia -1.7 0.805 g/cm2 1.5% - DualFemur Neck Right 02/27/2010 62.0 Osteopenia -1.8 0.793 g/cm2 - - DualFemur Total Mean 11/12/2020 72.7 Osteopenia -1.3 0.849 g/cm2 -0.5% - DualFemur Total Mean 11/02/2019 71.7 Osteopenia -1.2 0.853 g/cm2 0.8% - DualFemur Total Mean 11/01/2018 70.7 Osteopenia -1.3 0.846 g/cm2 -0.5% - DualFemur Total Mean 10/26/2017 69.7 Osteopenia -1.3 0.850 g/cm2 -1.5% - DualFemur Total Mean 10/23/2016 68.7 Osteopenia -1.1 0.863 g/cm2 -2.6% Yes DualFemur Total Mean 10/23/2015 67.7 Normal -1.0 0.886 g/cm2 -0.1% - DualFemur Total Mean 10/31/2014 66.7 Normal -1.0 0.887 g/cm2 0.6% - DualFemur Total Mean 02/27/2010 62.0 Normal -1.0 0.882 g/cm2 - - ASSESSMENT: The BMD measured at Femur Neck Right is 0.805 g/cm2 with a T-score of -1.7. This patient is considered osteopenic according to [facility]) criteria. The scan quality is good. L-4 was excluded due to degenerative changes. Compared with prior study, there has been no significant change in the spine. Compared with prior study, there has been no significant change in the total hip. [facility]) criteria for post-menopausal, Caucasian Women: Normal:                   T-score at or above -1 SD Osteopenia/low bone mass: T-score between -1 and -2.5 SD Osteoporosis:             T-score at or below -2.5 SD RECOMMENDATIONS: 1. All patients should optimize calcium and vitamin D intake. 2. Consider FDA-approved medical therapies in postmenopausal women and men aged 73 years and older, based on the following: a. A hip or vertebral(clinical or morphometric) fracture b. T-score < -2.5 at the femoral neck or spine after appropriate evaluation to exclude secondary causes c. Low bone mass (T-score between -1.0 and -2.5 at the femoral neck or  spine) and a 10-year probability of a hip fracture > 3% or a 10-year probability of a major osteoporosis-related fracture > 20% based on the [place]-adapted WHO algorithm 3. Clinician judgment and/or patient preferences may indicate treatment for people with 10-year fracture probabilities above or below these levels FOLLOW-UP: People with diagnosed cases of osteoporosis or at high risk for fracture should have regular bone mineral density tests. For patients eligible for Medicare, routine testing is  allowed once every 2 years. The testing frequency can be increased to one year for patients who have rapidly progressing disease, those who are receiving or discontinuing medical therapy to restore bone mass, or have additional risk factors. I have reviewed this report, and agree with the above findings. [facility] Electronically Signed   By: Lowella Grip III M.D.   On: 11/12/2020 16:05   MM 3D SCREEN BREAST UNI LEFT  Result Date: 11/13/2020 CLINICAL DATA:  Screening. EXAM: DIGITAL SCREENING UNILATERAL LEFT MAMMOGRAM WITH CAD AND TOMOSYNTHESIS TECHNIQUE: Left screening digital craniocaudal and mediolateral oblique mammograms were obtained. Left screening digital breast tomosynthesis was performed. The images were evaluated with computer-aided detection. COMPARISON:  Previous exam(s). ACR Breast Density Category c: The breast tissue is heterogeneously dense, which may obscure small masses. FINDINGS: There are no findings suspicious for malignancy. IMPRESSION: No mammographic evidence of malignancy. A result letter of this screening mammogram will be mailed directly to the patient. RECOMMENDATION: Screening mammogram in one year. (Code:SM-B-01Y) BI-RADS CATEGORY  1: Negative. Electronically Signed   By: Lajean Manes M.D.   On: 11/13/2020 10:13   Assessment & Plan:   Problem List Items Addressed This Visit       Unprioritized   Benign essential tremor    Progressing despite use of propranolol  and primidone.  Discussed her consideration of obtaining a second opinion , which would likely result ing the offering of a procedure that she would not  Consider having       Hyperlipidemia    Managed with rovustatin without adverse effects for 10 yr risk of CAD estimated at 23% using the FRC .  Lab Results  Component Value Date   CHOL 184 09/27/2020   HDL 44.30 09/27/2020   LDLCALC 116 (H) 09/27/2020   LDLDIRECT 218.0 08/05/2017   TRIG 118.0 09/27/2020   CHOLHDL 4 09/27/2020   Lab Results  Component Value Date   ALT 14 08/09/2020   AST 12 08/09/2020   ALKPHOS 83 08/09/2020   BILITOT 0.4 08/09/2020         Relevant Orders   Comprehensive metabolic panel   Acquired hypothyroidism    Thyroid function is active on current dose of 100 mcg daily .   Lab Results  Component Value Date   TSH 3.96 09/27/2020         Relevant Orders   TSH   Other Visit Diagnoses     Need for immunization against influenza    -  Primary   Relevant Orders   Flu Vaccine QUAD High Dose(Fluad) (Completed)       I have discontinued Tyrene P. Shedlock's doxycycline. I am also having her maintain her naproxen sodium, Vitamin D3, ALPRAZolam, rosuvastatin, propranolol, primidone, levothyroxine, and triamcinolone cream.  No orders of the defined types were placed in this encounter.   Medications Discontinued During This Encounter  Medication Reason   doxycycline (VIBRA-TABS) 100 MG tablet     Follow-up: No follow-ups on file.   Crecencio Mc, MD

## 2021-02-05 NOTE — Assessment & Plan Note (Signed)
Managed with rovustatin without adverse effects for 10 yr risk of CAD estimated at 23% using the FRC .  Lab Results  Component Value Date   CHOL 184 09/27/2020   HDL 44.30 09/27/2020   LDLCALC 116 (H) 09/27/2020   LDLDIRECT 218.0 08/05/2017   TRIG 118.0 09/27/2020   CHOLHDL 4 09/27/2020   Lab Results  Component Value Date   ALT 14 08/09/2020   AST 12 08/09/2020   ALKPHOS 83 08/09/2020   BILITOT 0.4 08/09/2020

## 2021-02-05 NOTE — Assessment & Plan Note (Signed)
Thyroid function is active on current dose of 100 mcg daily .   Lab Results  Component Value Date   TSH 3.96 09/27/2020

## 2021-02-05 NOTE — Assessment & Plan Note (Signed)
Progressing despite use of propranolol and primidone.  Discussed her consideration of obtaining a second opinion , which would likely result ing the offering of a procedure that she would not  Consider having

## 2021-03-13 ENCOUNTER — Other Ambulatory Visit: Payer: Self-pay | Admitting: Neurology

## 2021-03-13 DIAGNOSIS — G250 Essential tremor: Secondary | ICD-10-CM

## 2021-03-25 ENCOUNTER — Other Ambulatory Visit: Payer: Self-pay | Admitting: Internal Medicine

## 2021-04-01 NOTE — Progress Notes (Signed)
Assessment/Plan:    1.  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    -Continue propranolol, 40 mg twice per day.  -she will continue primidone 250 mg bid  -discussed 2nd line meds.  Ultimately, she decided to try Zonegran.  We will work up to 200 mg at bedtime.  R/b/se discussed.  -Patient asked very detailed questions today about deep brain stimulation, the preop and operative and postoperative procedures.  We discussed those in detail, along with risks and benefits.  Her husband was there and asked questions as well.  Patient will think about that.  If we were to consider it, I think she should just do the left VIM.  -Discussed [employer] and information given.  She may be interested in that.  Information given on readi steadi as well.  Subjective:   Tiffany Chang was seen today in follow up for essential tremor.  My previous records were reviewed prior to todays visit. She is noting continued tremor.  She emailed me since her last visit and wanted to increase her primidone.  We did that.  She is currently on primidone, 250 mg twice per day.  This is in addition to her propranolol.  Tremor is continuing to get worse.  She got some heavy utensils to eat with and they help some.  She has a weighted mug as well.  She is able to cook and garden.  She has to modify what she eats at a restaurant.    Current prescribed movement disorder medications: Primidone, 250 mg twice per day (increased since our last visit) Propranolol, 40 mg twice per day     ALLERGIES:   Allergies  Allergen Reactions   Statins Other (See Comments)     Leg cramps in high doses   Bee Venom Hives    CURRENT MEDICATIONS:  Outpatient Encounter Medications as of 04/03/2021  Medication Sig   ALPRAZolam (XANAX) 0.25 MG tablet Take 1 tablet (0.25 mg total) by mouth 2 (two) times daily as needed for  anxiety.   Cholecalciferol (VITAMIN D3) 50 MCG (2000 UT) TABS Take 1 tablet by mouth daily.    levothyroxine (SYNTHROID) 100 MCG tablet TAKE 1 TABLET BY MOUTH EVERY DAY   naproxen sodium (ALEVE) 220 MG tablet Take 220 mg by mouth 2 (two) times daily as needed (pain).   primidone (MYSOLINE) 250 MG tablet Take 1 tablet (250 mg total) by mouth in the morning and at bedtime.   propranolol (INDERAL) 40 MG tablet TAKE 1 TABLET BY MOUTH TWICE A DAY   rosuvastatin (CRESTOR) 5 MG tablet TAKE 1 TABLET BY MOUTH EVERY DAY   triamcinolone cream (KENALOG) 0.5 % Apply topically.   No facility-administered encounter medications on file as of 04/03/2021.     Objective:    PHYSICAL EXAMINATION:    VITALS:   Vitals:   04/03/21 1110  BP: 124/72  Pulse: 64  SpO2: 98%  Weight: 163 lb 9.6 oz (74.2 kg)  Height: 5\' 6"  (1.676 m)     GEN:  The patient appears stated age and is in NAD. HEENT:  Normocephalic, atraumatic.  The mucous membranes are moist.   Neurological examination:  Orientation: The patient is alert and oriented x3. Cranial nerves: There is good facial symmetry. The speech is fluent and clear. Soft palate rises symmetrically and there is no tongue deviation. Hearing is intact  to conversational tone. Sensation: Sensation is intact to light touch throughout Motor: Strength is at least antigravity x4.  Movement examination: Tone: There is normal tone in the UE/LE Abnormal movements: no rest tremor.  No postural tremor.  Has mod intention tremor on the right.  Has difficulty with Archimedes spirals, right much more so than left.  I have reviewed and interpreted the following labs independently   Chemistry      Component Value Date/Time   NA 140 08/09/2020 0923   K 4.8 08/09/2020 0923   CL 102 08/09/2020 0923   CO2 33 (H) 08/09/2020 0923   BUN 16 08/09/2020 0923   CREATININE 0.69 08/09/2020 0923      Component Value Date/Time   CALCIUM 9.6 08/09/2020 0923   ALKPHOS 83 08/09/2020  0923   AST 12 08/09/2020 0923   ALT 14 08/09/2020 0923   BILITOT 0.4 08/09/2020 0923      Lab Results  Component Value Date   WBC 6.2 08/10/2019   HGB 14.2 08/10/2019   HCT 42.1 08/10/2019   MCV 88.8 08/10/2019   PLT 320.0 08/10/2019   Lab Results  Component Value Date   TSH 3.96 09/27/2020     Chemistry      Component Value Date/Time   NA 140 08/09/2020 0923   K 4.8 08/09/2020 0923   CL 102 08/09/2020 0923   CO2 33 (H) 08/09/2020 0923   BUN 16 08/09/2020 0923   CREATININE 0.69 08/09/2020 0923      Component Value Date/Time   CALCIUM 9.6 08/09/2020 0923   ALKPHOS 83 08/09/2020 0923   AST 12 08/09/2020 0923   ALT 14 08/09/2020 0923   BILITOT 0.4 08/09/2020 0923         Total time spent on today's visit was 44 minutes, including both face-to-face time and nonface-to-face time.  Time included that spent on review of records (prior notes available to me/labs/imaging if pertinent), discussing treatment and goals, answering patient's questions and coordinating care.  Cc:  Crecencio Mc, MD

## 2021-04-03 ENCOUNTER — Other Ambulatory Visit (INDEPENDENT_AMBULATORY_CARE_PROVIDER_SITE_OTHER): Payer: Medicare Other

## 2021-04-03 ENCOUNTER — Encounter: Payer: Self-pay | Admitting: Neurology

## 2021-04-03 ENCOUNTER — Ambulatory Visit (INDEPENDENT_AMBULATORY_CARE_PROVIDER_SITE_OTHER): Payer: Medicare Other | Admitting: Neurology

## 2021-04-03 ENCOUNTER — Other Ambulatory Visit: Payer: Self-pay

## 2021-04-03 VITALS — BP 124/72 | HR 64 | Ht 66.0 in | Wt 163.6 lb

## 2021-04-03 DIAGNOSIS — E782 Mixed hyperlipidemia: Secondary | ICD-10-CM

## 2021-04-03 DIAGNOSIS — E039 Hypothyroidism, unspecified: Secondary | ICD-10-CM | POA: Diagnosis not present

## 2021-04-03 DIAGNOSIS — G250 Essential tremor: Secondary | ICD-10-CM | POA: Diagnosis not present

## 2021-04-03 LAB — COMPREHENSIVE METABOLIC PANEL
ALT: 17 U/L (ref 0–35)
AST: 12 U/L (ref 0–37)
Albumin: 4.1 g/dL (ref 3.5–5.2)
Alkaline Phosphatase: 84 U/L (ref 39–117)
BUN: 16 mg/dL (ref 6–23)
CO2: 32 mEq/L (ref 19–32)
Calcium: 9.1 mg/dL (ref 8.4–10.5)
Chloride: 103 mEq/L (ref 96–112)
Creatinine, Ser: 0.65 mg/dL (ref 0.40–1.20)
GFR: 87.5 mL/min (ref >60.00)
Glucose, Bld: 93 mg/dL (ref 70–99)
Potassium: 5 mEq/L (ref 3.5–5.1)
Sodium: 139 mEq/L (ref 135–145)
Total Bilirubin: 0.3 mg/dL (ref 0.2–1.2)
Total Protein: 6.4 g/dL (ref 6.0–8.3)

## 2021-04-03 LAB — TSH: TSH: 4.25 u[IU]/mL (ref 0.35–5.50)

## 2021-04-03 MED ORDER — ZONISAMIDE 100 MG PO CAPS: 200.0000 mg | ORAL_CAPSULE | Freq: Every day | ORAL | 1 refills | Status: DC

## 2021-04-03 NOTE — Patient Instructions (Signed)
Start zonegran 100 mg - 1 at bedtime for 2 weeks and then increase to 2 at bedtime thereafter

## 2021-04-15 ENCOUNTER — Encounter: Payer: Self-pay | Admitting: Internal Medicine

## 2021-04-15 ENCOUNTER — Other Ambulatory Visit: Payer: Self-pay | Admitting: Internal Medicine

## 2021-04-15 MED ORDER — LEVOTHYROXINE SODIUM 112 MCG PO TABS: 112.0000 ug | ORAL_TABLET | Freq: Every day | ORAL | 1 refills | Status: DC

## 2021-04-19 ENCOUNTER — Other Ambulatory Visit: Payer: Self-pay | Admitting: Neurology

## 2021-04-24 ENCOUNTER — Other Ambulatory Visit: Payer: Self-pay

## 2021-04-24 ENCOUNTER — Encounter: Payer: Self-pay | Admitting: Neurology

## 2021-04-24 DIAGNOSIS — G250 Essential tremor: Secondary | ICD-10-CM

## 2021-04-24 NOTE — Telephone Encounter (Signed)
Referral has been sent to  [facility]

## 2021-04-24 NOTE — Telephone Encounter (Signed)
See my chart message.  Refer to [facility] for focused ultrasound for ET.

## 2021-05-31 ENCOUNTER — Encounter: Payer: Self-pay | Admitting: Internal Medicine

## 2021-05-31 NOTE — Telephone Encounter (Signed)
Pt stated that she will ned to reschedule her appt with you on 06/05/2021. I have already canceled the appt but wasn't sure if I should schedule the appt or not.

## 2021-06-05 ENCOUNTER — Ambulatory Visit: Payer: Medicare Other

## 2021-06-06 ENCOUNTER — Other Ambulatory Visit: Payer: Self-pay | Admitting: Neurology

## 2021-06-06 DIAGNOSIS — G250 Essential tremor: Secondary | ICD-10-CM

## 2021-06-17 ENCOUNTER — Encounter: Payer: Self-pay | Admitting: Neurology

## 2021-07-11 ENCOUNTER — Other Ambulatory Visit: Payer: Self-pay | Admitting: Neurology

## 2021-07-11 DIAGNOSIS — G250 Essential tremor: Secondary | ICD-10-CM

## 2021-08-04 ENCOUNTER — Encounter: Payer: Self-pay | Admitting: Neurology

## 2021-08-05 ENCOUNTER — Other Ambulatory Visit: Payer: Self-pay

## 2021-08-05 ENCOUNTER — Encounter: Payer: Self-pay | Admitting: Internal Medicine

## 2021-08-05 ENCOUNTER — Ambulatory Visit (INDEPENDENT_AMBULATORY_CARE_PROVIDER_SITE_OTHER): Payer: Medicare Other | Admitting: Internal Medicine

## 2021-08-05 VITALS — BP 138/78 | HR 60 | Temp 97.9°F | Ht 66.0 in | Wt 165.0 lb

## 2021-08-05 DIAGNOSIS — M8580 Other specified disorders of bone density and structure, unspecified site: Secondary | ICD-10-CM

## 2021-08-05 DIAGNOSIS — G250 Essential tremor: Secondary | ICD-10-CM

## 2021-08-05 DIAGNOSIS — C50411 Malignant neoplasm of upper-outer quadrant of right female breast: Secondary | ICD-10-CM

## 2021-08-05 DIAGNOSIS — E039 Hypothyroidism, unspecified: Secondary | ICD-10-CM

## 2021-08-05 DIAGNOSIS — Z170 Estrogen receptor positive status [ER+]: Secondary | ICD-10-CM

## 2021-08-05 DIAGNOSIS — R7303 Prediabetes: Secondary | ICD-10-CM | POA: Diagnosis not present

## 2021-08-05 DIAGNOSIS — E785 Hyperlipidemia, unspecified: Secondary | ICD-10-CM | POA: Diagnosis not present

## 2021-08-05 DIAGNOSIS — M48061 Spinal stenosis, lumbar region without neurogenic claudication: Secondary | ICD-10-CM

## 2021-08-05 LAB — COMPREHENSIVE METABOLIC PANEL
ALT: 20 U/L (ref 0–35)
AST: 14 U/L (ref 0–37)
Albumin: 4.3 g/dL (ref 3.5–5.2)
Alkaline Phosphatase: 91 U/L (ref 39–117)
BUN: 16 mg/dL (ref 6–23)
CO2: 30 mEq/L (ref 19–32)
Calcium: 9.5 mg/dL (ref 8.4–10.5)
Chloride: 102 mEq/L (ref 96–112)
Creatinine, Ser: 0.68 mg/dL (ref 0.40–1.20)
GFR: 86.35 mL/min (ref >60.00)
Glucose, Bld: 95 mg/dL (ref 70–99)
Potassium: 5 mEq/L (ref 3.5–5.1)
Sodium: 139 mEq/L (ref 135–145)
Total Bilirubin: 0.4 mg/dL (ref 0.2–1.2)
Total Protein: 6.7 g/dL (ref 6.0–8.3)

## 2021-08-05 LAB — HEMOGLOBIN A1C: Hgb A1c MFr Bld: 5.9 % (ref 4.6–6.5)

## 2021-08-05 LAB — LIPID PANEL
Cholesterol: 195 mg/dL (ref 0–200)
HDL: 54.3 mg/dL (ref >39.00)
LDL Cholesterol: 118 mg/dL — ABNORMAL HIGH (ref 0–99)
NonHDL: 140.74
Total CHOL/HDL Ratio: 4
Triglycerides: 115 mg/dL (ref 0.0–149.0)
VLDL: 23 mg/dL (ref 0.0–40.0)

## 2021-08-05 LAB — TSH: TSH: 3.55 u[IU]/mL (ref 0.35–5.50)

## 2021-08-05 NOTE — Assessment & Plan Note (Signed)
She is scheduled for an U/s guided thalotomy by [facility] neurosurgeon in late April ?

## 2021-08-05 NOTE — Assessment & Plan Note (Signed)
She has kept her a1c < 6.0 with a low glycemic index diet and participation regularly in an aerobic  Exercise program  ? ?Lab Results  ?Component Value Date  ? HGBA1C 5.9 08/05/2021  ? ? ?

## 2021-08-05 NOTE — Progress Notes (Signed)
? ?Subjective:  ?Patient ID: Tiffany Chang, female    DOB: 1946/03/30  Age: 76 y.o. MRN: [id number] ? ?CC: The primary encounter diagnosis was Hyperlipidemia, unspecified hyperlipidemia type. Diagnoses of Prediabetes, Acquired hypothyroidism, Malignant neoplasm of upper-outer quadrant of right breast in female, estrogen receptor positive ([place]), Benign essential tremor, Spinal stenosis of lumbar region without neurogenic claudication, and Osteopenia, unspecified location were also pertinent to this visit. ? ? ?This visit occurred during the SARS-CoV-2 public health emergency.  Safety protocols were in place, including screening questions prior to the visit, additional usage of staff PPE, and extensive cleaning of exam room while observing appropriate contact time as indicated for disinfecting solutions.   ? ?HPI ?Tiffany Chang presents for follow up on hyperlipidemia, hypothyroidism, and essential tremor,  ?Chief Complaint  ?Patient presents with  ? Follow-up  ?  6 month follow up   ? ?1) Hyperlipidemia:   managed with 5 mg crestor daily, tolerating without side effects  ? ?2) Essential tremor:  suboptimally managed on multiple medications.  Planning to have u/s guided thal[facility] in late April at [facility]  ? ?3) Exercising : walking,  strength training and yoga several times weekly  ? ?4) boosted for COVID infection in January wearing masks to outings and with travel ? ?Outpatient Medications Prior to Visit  ?Medication Sig Dispense Refill  ? ALPRAZolam (XANAX) 0.25 MG tablet Take 1 tablet (0.25 mg total) by mouth 2 (two) times daily as needed for anxiety. 20 tablet 0  ? Cholecalciferol (VITAMIN D3) 50 MCG (2000 UT) TABS Take 1 tablet by mouth daily.     ? levothyroxine (SYNTHROID) 112 MCG tablet Take 1 tablet (112 mcg total) by mouth daily. 90 tablet 1  ? naproxen sodium (ALEVE) 220 MG tablet Take 220 mg by mouth 2 (two) times daily as needed (pain).    ? primidone (MYSOLINE) 250 MG tablet TAKE 1 TABLET BY MOUTH EVERY  MORNING AND AT BEDTIME 180 tablet 0  ? propranolol (INDERAL) 40 MG tablet TAKE 1 TABLET BY MOUTH TWICE A DAY 180 tablet 0  ? rosuvastatin (CRESTOR) 5 MG tablet TAKE 1 TABLET BY MOUTH EVERY DAY 90 tablet 1  ? triamcinolone cream (KENALOG) 0.5 % Apply topically.    ? zonisamide (ZONEGRAN) 100 MG capsule Take 2 capsules (200 mg total) by mouth at bedtime. (Patient taking differently: Take 200 mg by mouth at bedtime. 1 capsule at bedtime) 180 capsule 1  ? ?No facility-administered medications prior to visit.  ? ? ?Review of Systems; ? ?Patient denies headache, fevers, malaise, unintentional weight loss, skin rash, eye pain, sinus congestion and sinus pain, sore throat, dysphagia,  hemoptysis , cough, dyspnea, wheezing, chest pain, palpitations, orthopnea, edema, abdominal pain, nausea, melena, diarrhea, constipation, flank pain, dysuria, hematuria, urinary  Frequency, nocturia, numbness, tingling, seizures,  Focal weakness, Loss of consciousness, insomnia, depression, anxiety, and suicidal ideation.   ? ? ? ?Objective:  ?BP 138/78 (BP Location: Left Arm, Patient Position: Sitting, Cuff Size: Normal)   Pulse 60   Temp 97.9 ?F (36.6 ?C) (Oral)   Ht '5\' 6"'$  (1.676 m)   Wt 165 lb (74.8 kg)   SpO2 99%   BMI 26.63 kg/m?  ? ?BP Readings from Last 3 Encounters:  ?08/05/21 138/78  ?04/03/21 124/72  ?02/04/21 124/66  ? ? ?Wt Readings from Last 3 Encounters:  ?08/05/21 165 lb (74.8 kg)  ?04/03/21 163 lb 9.6 oz (74.2 kg)  ?02/04/21 162 lb 6.4 oz (73.7 kg)  ? ? ?General appearance:  alert, cooperative and appears stated age ?Ears: normal TM's and external ear canals both ears ?Throat: lips, mucosa, and tongue normal; teeth and gums normal ?Neck: no adenopathy, no carotid bruit, supple, symmetrical, trachea midline and thyroid not enlarged, symmetric, no tenderness/mass/nodules ?Back: symmetric, no curvature. ROM normal. No CVA tenderness. ?Lungs: clear to auscultation bilaterally ?Heart: regular rate and rhythm, S1, S2 normal, no  murmur, click, rub or gallop ?Abdomen: soft, non-tender; bowel sounds normal; no masses,  no organomegaly ?Pulses: 2+ and symmetric ?Skin: Skin color, texture, turgor normal. No rashes or lesions ?Lymph nodes: Cervical, supraclavicular, and axillary nodes normal. ? ?Lab Results  ?Component Value Date  ? HGBA1C 5.9 08/05/2021  ? HGBA1C 5.8 08/09/2020  ? HGBA1C 6.0 02/10/2020  ? ? ?Lab Results  ?Component Value Date  ? CREATININE 0.68 08/05/2021  ? CREATININE 0.65 04/03/2021  ? CREATININE 0.69 08/09/2020  ? ? ?Lab Results  ?Component Value Date  ? WBC 6.2 08/10/2019  ? HGB 14.2 08/10/2019  ? HCT 42.1 08/10/2019  ? PLT 320.0 08/10/2019  ? GLUCOSE 95 08/05/2021  ? CHOL 195 08/05/2021  ? TRIG 115.0 08/05/2021  ? HDL 54.30 08/05/2021  ? LDLDIRECT 218.0 08/05/2017  ? LDLCALC 118 (H) 08/05/2021  ? ALT 20 08/05/2021  ? AST 14 08/05/2021  ? NA 139 08/05/2021  ? K 5.0 08/05/2021  ? CL 102 08/05/2021  ? CREATININE 0.68 08/05/2021  ? BUN 16 08/05/2021  ? CO2 30 08/05/2021  ? TSH 3.55 08/05/2021  ? HGBA1C 5.9 08/05/2021  ? MICROALBUR <0.7 08/10/2019  ? ? ?DG Bone Density ? ?Result Date: 11/12/2020 ?EXAM: DUAL X-RAY ABSORPTIOMETRY (DXA) FOR BONE MINERAL DENSITY IMPRESSION: Dear Dr. Grayland Ormond, Your patient Tiffany Chang completed a FRAX assessment on 11/12/2020 using the [place] (analysis version: 14.10) manufactured by [employer]. The following summarizes the results of our evaluation. PATIENT BIOGRAPHICAL: Name: Tiffany Chang Patient ID: [id number] Birth Date: May 04, 1946 Height:    67.0 in. Gender:     Female    [age]       Weight:    167.4 lbs. Ethnicity:  White                            Exam Date: 11/12/2020 FRAX* RESULTS:  (version: 3.5) 10-year Probability of Fracture1 Major Osteoporotic Fracture2 Hip Fracture 11.4% 2.2% Population: [place] (Caucasian) Risk Factors: None Based on Femur (Right) Neck BMD 1 -The 10-year probability of fracture may be lower than reported if the patient has received treatment. 2  -Major Osteoporotic Fracture: Clinical Spine, Forearm, Hip or Shoulder *FRAX is a Materials engineer of the [employer] for Metabolic Bone Disease, a [place] (WHO) [facility]. ASSESSMENT: The probability of a major osteoporotic fracture is 11.4% within the next ten years. The probability of a hip fracture is 2.2% within the next ten years. . Your patient Alonda Weaber completed a BMD test on 11/12/2020 using the [place] (software version: 14.10) manufactured by [employer]. The following summarizes the results of our evaluation. Technologist: [facility] PATIENT BIOGRAPHICAL: Name: Sreenidhi, Ganson Patient ID: [id number] Birth Date: Jun 14, 1945 Height: 67.0 in. Gender: Female Exam Date: 11/12/2020 Weight: 167.4 lbs. Indications: Advanced Age, Caucasian, Family Hx of Osteoporosis, Height Loss, History of Breast Cancer, Hypothyroid, Postmenopausal Fractures: Treatments: Calcium, Synthroid, Vit D DENSITOMETRY RESULTS: Site      Region  Measured Date Measured Age WHO Classification Young Adult T-score BMD         %Change vs. Previous Significant Change (*) AP Spine L1-L3 11/12/2020 72.7 Osteopenia -1.4 1.006 g/cm2 -0.6% - AP Spine L1-L3 11/02/2019 71.7 Osteopenia -1.4 1.012 g/cm2 -2.3% - AP Spine L1-L3 11/01/2018 70.7 Osteopenia -1.2 1.036 g/cm2 -0.5% - AP Spine L1-L3 10/26/2017 69.7 Osteopenia -1.1 1.041 g/cm2 0.3% - AP Spine L1-L3 10/23/2016 68.7 Osteopenia -1.2 1.038 g/cm2 -5.6% Yes AP Spine L1-L3 10/23/2015 67.7 Normal -0.7 1.099 g/cm2 -0.2% - AP Spine L1-L3 10/31/2014 66.7 Normal -0.7 1.101 g/cm2 -0.5% - AP Spine L1-L3 08/31/2013 65.5 Normal -0.6 1.107 g/cm2 0.2% - AP Spine L1-L3 02/27/2010 62.0 Normal -0.6 1.105 g/cm2 - - DualFemur Neck Right 11/12/2020 72.7 Osteopenia -1.7 0.805 g/cm2 -1.3% - DualFemur Neck Right 11/02/2019 71.7 Osteopenia -1.6 0.816 g/cm2 6.7% - DualFemur Neck Right 11/01/2018 70.7 Osteopenia -2.0 0.765 g/cm2 -0.5% - DualFemur Neck  Right 10/26/2017 69.7 Osteopenia -1.9 0.769 g/cm2 -0.6% - DualFemur Neck Right 10/23/2016 68.7 Osteopenia -1.9 0.774 g/cm2 -4.1% - DualFemur Neck Right 10/23/2015 67.7 Osteopenia -1.7 0.807 g/cm2 0.2% - DualFemur Neck R

## 2021-08-05 NOTE — Assessment & Plan Note (Signed)
Managed with yoga and strength training ?

## 2021-08-05 NOTE — Assessment & Plan Note (Signed)
Managed with Evista prior to diagnosis of breast cancer.  She has been taking Letrozole for 5 years; and her T scores on June 2022 are unchanged at -1.7  ?

## 2021-08-05 NOTE — Assessment & Plan Note (Addendum)
Thyroid function is WNL on current dose.  No current changes needed.  ? ?Lab Results  ?Component Value Date  ? TSH 3.55 08/05/2021  ? ? ?

## 2021-08-05 NOTE — Assessment & Plan Note (Addendum)
Managed with 5 mg crestor daily ? ?Lab Results  ?Component Value Date  ? CHOL 195 08/05/2021  ? HDL 54.30 08/05/2021  ? LDLCALC 118 (H) 08/05/2021  ? LDLDIRECT 218.0 08/05/2017  ? TRIG 115.0 08/05/2021  ? CHOLHDL 4 08/05/2021  ? ? ? ? ?

## 2021-08-07 ENCOUNTER — Encounter: Payer: Self-pay | Admitting: Internal Medicine

## 2021-08-26 ENCOUNTER — Encounter: Payer: Self-pay | Admitting: Neurology

## 2021-09-03 ENCOUNTER — Other Ambulatory Visit: Payer: Self-pay | Admitting: Neurology

## 2021-09-03 DIAGNOSIS — G250 Essential tremor: Secondary | ICD-10-CM

## 2021-09-13 ENCOUNTER — Encounter: Payer: Self-pay | Admitting: Neurology

## 2021-09-26 ENCOUNTER — Encounter: Payer: Self-pay | Admitting: Oncology

## 2021-10-03 ENCOUNTER — Other Ambulatory Visit: Payer: Self-pay | Admitting: Internal Medicine

## 2021-10-09 ENCOUNTER — Ambulatory Visit: Payer: Medicare Other | Admitting: Neurology

## 2021-10-11 ENCOUNTER — Other Ambulatory Visit: Payer: Self-pay | Admitting: Neurology

## 2021-10-11 DIAGNOSIS — G250 Essential tremor: Secondary | ICD-10-CM

## 2021-10-22 ENCOUNTER — Telehealth: Payer: Self-pay | Admitting: Internal Medicine

## 2021-10-22 NOTE — Telephone Encounter (Signed)
Spoke with patient she req [facility] July 2023

## 2021-11-12 ENCOUNTER — Other Ambulatory Visit: Payer: Self-pay | Admitting: Neurology

## 2021-11-12 DIAGNOSIS — G250 Essential tremor: Secondary | ICD-10-CM

## 2021-11-13 ENCOUNTER — Other Ambulatory Visit: Payer: Self-pay

## 2021-11-13 DIAGNOSIS — G250 Essential tremor: Secondary | ICD-10-CM

## 2021-11-13 MED ORDER — PRIMIDONE 250 MG PO TABS: ORAL_TABLET | ORAL | 0 refills | Status: DC

## 2021-11-20 ENCOUNTER — Ambulatory Visit
Admission: RE | Admit: 2021-11-20 | Discharge: 2021-11-20 | Disposition: A | Discharge status: Home / Self Care | Payer: Medicare Other | Source: Ambulatory Visit | Attending: Oncology | Admitting: Oncology

## 2021-11-20 DIAGNOSIS — Z780 Asymptomatic menopausal state: Secondary | ICD-10-CM

## 2021-11-20 DIAGNOSIS — Z1231 Encounter for screening mammogram for malignant neoplasm of breast: Secondary | ICD-10-CM | POA: Diagnosis present

## 2021-11-22 ENCOUNTER — Ambulatory Visit (INDEPENDENT_AMBULATORY_CARE_PROVIDER_SITE_OTHER): Payer: Medicare Other

## 2021-11-22 VITALS — Ht 66.0 in | Wt 165.0 lb

## 2021-11-22 DIAGNOSIS — Z0000 Encounter for general adult medical examination without abnormal findings: Secondary | ICD-10-CM

## 2021-11-22 NOTE — Patient Instructions (Addendum)
  Tiffany Chang , Thank you for taking time to come for your Medicare Wellness Visit. I appreciate your ongoing commitment to your health goals. Please review the following plan we discussed and let me know if I can assist you in the future.   These are the goals we discussed:  Goals       Patient Stated     I would like to lose 5-8lb (pt-stated)      Healthy diet      Other     Follow up with Primary Care Provider      As needed.        This is a list of the screening recommended for you and due dates:  Health Maintenance  Topic Date Due   Flu Shot  12/17/2021   Tetanus Vaccine  06/29/2022   Mammogram  11/21/2022   Colon Cancer Screening  09/25/2024   Pneumonia Vaccine  Completed   DEXA scan (bone density measurement)  Completed   COVID-19 Vaccine  Completed   Hepatitis C Screening: USPSTF Recommendation to screen - Ages 28-79 yo.  Completed   Zoster (Shingles) Vaccine  Completed   HPV Vaccine  Aged Out

## 2021-11-22 NOTE — Progress Notes (Signed)
Subjective:   Tiffany Chang is a 76 y.o. female who presents for Medicare Annual (Subsequent) preventive examination.  Review of Systems    No ROS.  Medicare Wellness Virtual Visit.  Visual/audio telehealth visit, UTA vital signs.   See social history for additional risk factors.   Cardiac Risk Factors include: advanced age (>38mn, >>84women)     Objective:    Today's Vitals   11/22/21 1532  Weight: 165 lb (74.8 kg)  Height: '5\' 6"'$  (1.676 m)   Body mass index is 26.63 kg/m.     11/22/2021    3:38 PM 04/03/2021   11:07 AM 09/27/2020   10:44 AM 06/04/2020   10:48 AM 05/08/2020    1:39 PM 03/29/2020    2:28 PM 11/01/2019    2:04 PM  Advanced Directives  Does Patient Have a Medical Advance Directive? Yes Yes Yes Yes No Yes Yes  Type of AParamed[place]of [place] will Living will [place]Living will;Out of facility DNR (pink MOST or yellow form) [place] will  [place] will [place] will  Does patient want to make changes to medical advance directive? No - Patient declined   No - Patient declined   No - Patient declined  Copy of HBenoitin Chart? No - copy requested   No - copy requested   No - copy requested  Would patient like information on creating a medical advance directive?     No - Patient declined      Current Medications (verified) Outpatient Encounter Medications as of 11/22/2021  Medication Sig   ALPRAZolam (XANAX) 0.25 MG tablet Take 1 tablet (0.25 mg total) by mouth 2 (two) times daily as needed for anxiety.   Cholecalciferol (VITAMIN D3) 50 MCG (2000 UT) TABS Take 1 tablet by mouth daily.    levothyroxine (SYNTHROID) 112 MCG tablet TAKE 1 TABLET BY MOUTH EVERY DAY   naproxen sodium (ALEVE) 220 MG tablet Take 220 mg by mouth 2 (two) times daily as needed (pain).   primidone (MYSOLINE) 250 MG tablet TAKE 1 TABLET BY  MOUTH EVERY MORNING AND AT BEDTIME   propranolol (INDERAL) 40 MG tablet TAKE 1 TABLET BY MOUTH TWICE A DAY   rosuvastatin (CRESTOR) 5 MG tablet TAKE 1 TABLET BY MOUTH EVERY DAY   triamcinolone cream (KENALOG) 0.5 % Apply topically.   zonisamide (ZONEGRAN) 100 MG capsule Take 2 capsules (200 mg total) by mouth at bedtime. (Patient taking differently: Take 200 mg by mouth at bedtime. 1 capsule at bedtime)   No facility-administered encounter medications on file as of 11/22/2021.    Allergies (verified) Statins and Bee venom   History: Past Medical History:  Diagnosis Date   [place]ast cancer ([place]   [place]- RT MASTECTOMY   Hashimoto's thyroiditis    History of arthroscopy of left knee 1985   Hyperlipidemia    mild, with prior statin intolerance   Hypothyroid    Osteoporosis    prior Evista use x 5 yrs , 2007   Spinal stenosis    mild   Syncope, vasovagal    3 episodes   Tremor    Viral meningitis    76years old   Past Surgical History:  Procedure Laterality Date   CATARACT EXTRACTION W/PHACO Left 09/30/2017   Procedure: CATARACT EXTRACTION PHACO AND INTRAOCULAR LENS PLACEMENT ([place];  Surgeon: PBirder Robson MD;  Location: [facility];  Service: Ophthalmology;  Laterality: Left;  [place] 00:40 AP% 15.7 CDE 6.38 Fluid pack lot # [id number]   CATARACT EXTRACTION W/PHACO Right 06/07/2019   Procedure: CATARACT EXTRACTION PHACO AND INTRAOCULAR LENS PLACEMENT ([facility]) RIGHT 4.62 00:33.5;  Surgeon: Birder Robson, MD;  Location: [place];  Service: Ophthalmology;  Laterality: Right;   CESAREAN SECTION     CHOLECYSTECTOMY  2006   COLONOSCOPY WITH PROPOFOL N/A 09/26/2019   Procedure: COLONOSCOPY WITH BIOPSY;  Surgeon: Lucilla Lame, MD;  Location: [place];  Service: Endoscopy;  Laterality: N/A;  priority 3   FOOT SURGERY     plantar fascitis, Dr. Elvina Mattes   KNEE ARTHROSCOPY  1985   right   MASTECTOMY Right 2016   [place]   POLYPECTOMY N/A 09/26/2019   Procedure:  POLYPECTOMY;  Surgeon: Lucilla Lame, MD;  Location: [place];  Service: Endoscopy;  Laterality: N/A;   SENTINEL NODE BIOPSY Right 10/10/2014   Procedure: SENTINEL NODE BIOPSY;  Surgeon: Leonie Green, MD;  Location: [facility];  Service: General;  Laterality: Right;   SIMPLE MASTECTOMY WITH AXILLARY SENTINEL NODE BIOPSY Right 10/10/2014   Procedure: SIMPLE MASTECTOMY;  Surgeon: Leonie Green, MD;  Location: [facility];  Service: General;  Laterality: Right;   TONSILLECTOMY     Family History  Problem Relation Age of Onset   Osteoporosis Mother    Aortic aneurysm Father    Cancer Sister 20       Breast   Breast cancer Sister 35   Cancer Maternal Grandfather        breast   Breast cancer Maternal Grandmother 86   Social History   Socioeconomic History   Marital status: Married    Spouse name: Not on file   Number of children: Not on file   Years of education: Not on file   Highest education level: Not on file  Occupational History    Employer: [employer] [place]    Comment: exercise physiologist  Tobacco Use   Smoking status: Never   Smokeless tobacco: Never  Vaping Use   Vaping Use: Never used  Substance and Sexual Activity   Alcohol use: Yes    Alcohol/week: 3.0 standard drinks of alcohol    Types: 3 Standard drinks or equivalent per week    Comment: social, 2 times per week   Drug use: No   Sexual activity: Not Currently  Other Topics Concern   Not on file  Social History Narrative   Right handed   2 grown children   3 story home   Social Determinants of Health   Financial Resource Strain: Low Risk  (11/22/2021)   Overall Financial Resource Strain (CARDIA)    Difficulty of Paying Living Expenses: Not hard at all  Food Insecurity: No Food Insecurity (11/22/2021)   Hunger Vital Sign    Worried About Running Out of Food in the Last Year: Never true    Ran Out of Food in the Last Year: Never true  Transportation Needs: No  Transportation Needs (11/22/2021)   PRAPARE - Hydrologist (Medical): No    Lack of Transportation (Non-Medical): No  Physical Activity: Sufficiently Active (11/22/2021)   Exercise Vital Sign    Days of Exercise per Week: 5 days    Minutes of Exercise per Session: 50 min  Stress: No Stress Concern Present (11/22/2021)   [place]    Feeling of Stress : Only a little  Social Connections: Unknown (11/22/2021)   Social Connection and Isolation Panel [NHANES]    Frequency of Communication with Friends and Family: Not on file    Frequency of Social Gatherings with Friends and Family: Not on file    Attends Religious Services: Not on file    Active Member of Clubs or Organizations: Not on file    Attends Archivist Meetings: Not on file    Marital Status: Married    Tobacco Counseling Counseling given: Not Answered   Clinical Intake: Pre-visit preparation completed: Yes       Diabetes: No  How often do you need to have someone help you when you read instructions, pamphlets, or other written materials from your doctor or pharmacy?: 1 - Never Interpreter Needed?: No    Activities of Daily Living    11/22/2021    3:39 PM  In your present state of health, do you have any difficulty performing the following activities:  Hearing? 0  Vision? 0  Difficulty concentrating or making decisions? 0  Walking or climbing stairs? 0  Dressing or bathing? 0  Doing errands, shopping? 0  Preparing Food and eating ? N  Using the Toilet? N  In the past six months, have you accidently leaked urine? N  Do you have problems with loss of bowel control? N  Managing your Medications? N  Managing your Finances? N  Housekeeping or managing your Housekeeping? N   Patient Care Team: Crecencio Mc, MD as PCP - General (Internal Medicine) Lloyd Huger, MD as Consulting Physician  (Oncology)  Indicate any recent Medical Services you may have received from other than [facility] providers in the past year (date may be approximate).     Assessment:   This is a routine wellness examination for Attie.  Virtual Visit via Telephone Note  I connected with  Liliane Channel on 11/22/21 at  3:30 PM EDT by telephone and verified that I am speaking with the correct person using two identifiers.  Persons participating in the virtual visit: patient/Nurse Health Advisor   I discussed the limitations of performing an evaluation and management service by telehealth. Completed visit with audio only. Some vital signs may be absent or patient reported.   Hearing/Vision screen Hearing Screening - Comments:: Patient is able to hear conversational tones without difficulty. No issues reported. Vision Screening - Comments:: Followed [facility]  Cataract extraction, bilateral They have seen their ophthalmologist in the last 12 months.   Dietary issues and exercise activities discussed: Current Exercise Habits: Home exercise routine, Intensity: Mild Healthy diet Good water intake    Goals Addressed             This Visit's Progress    Follow up with Primary Care Provider       As needed.       Depression Screen    11/22/2021    3:38 PM 08/05/2021   10:09 AM 02/04/2021    8:16 AM 08/09/2020    8:48 AM 06/04/2020   10:43 AM 02/10/2020    8:18 AM 09/29/2019    9:21 AM  PHQ 2/9 Scores  PHQ - 2 Score 0 0 0 0 0 0 0    Fall Risk    11/22/2021    3:39 PM 08/05/2021   10:09 AM 04/03/2021   11:07 AM 02/04/2021    8:15 AM 09/27/2020   10:44 AM  Fall Risk   Falls in the past year? 0 0 0 0  0  Number falls in past yr:   0  0  Injury with Fall?   0  0  Risk for fall due to :  No Fall Risks     Follow up Falls evaluation completed Falls evaluation completed  Falls evaluation completed     [place]: Home free of loose throw rugs in walkways, pet  beds, electrical cords, etc? Yes  Adequate lighting in your home to reduce risk of falls? Yes   ASSISTIVE DEVICES UTILIZED TO PREVENT FALLS: Life alert? No  Use of a cane, walker or w/c? No   TIMED UP AND GO: Was the test performed? No .   Cognitive Function: Patient is alert and oriented x3.  Enjoys brain health stimulating games and activities.      08/05/2017    1:30 PM  MMSE - Mini Mental State Exam  Orientation to time 5  Orientation to Place 5  Registration 3  Attention/ Calculation 5  Recall 3  Language- name 2 objects 2  Language- repeat 1  Language- follow 3 step command 3  Language- read & follow direction 1  Write a sentence 0  Write a sentence-comments Did not ask to complete; tremor  Copy design 0  Copy design-comments Did not ask to complete; tremor  Total score 28        08/04/2016    1:32 PM  6CIT Screen  What Year? 0 points  What month? 0 points  What time? 0 points  Count back from 20 0 points  Months in reverse 0 points  Repeat phrase 0 points  Total Score 0 points    Immunizations Immunization History  Administered Date(s) Administered   Fluad Quad(high Dose 65+) 02/04/2021   Influenza Inj Mdck Quad With Preservative 03/02/2018   Influenza Split 02/17/2012, 02/16/2013   Influenza, High Dose Seasonal PF 02/12/2019, 02/16/2019, 01/31/2020   Influenza-Unspecified 02/16/2014, 02/17/2015, 03/06/2016, 02/13/2017, 03/02/2018   PFIZER(Purple Top)SARS-COV-2 Vaccination 06/21/2019, 07/12/2019, 02/21/2020, 10/16/2020   Pfizer Covid-19 Vaccine Bivalent Booster 67yr & up 05/22/2021   Pneumococcal Conjugate-13 07/11/2013   Pneumococcal Polysaccharide-23 08/04/2016   Tdap 06/29/2012   Zoster Recombinat (Shingrix) 01/22/2018, 01/25/2019   Zoster, Live 06/25/2013   Screening Tests Health Maintenance  Topic Date Due   INFLUENZA VACCINE  12/17/2021   TETANUS/TDAP  06/29/2022   MAMMOGRAM  11/21/2022   COLONOSCOPY (Pts [phone] coverage  will need to be confirmed)  09/25/2024   Pneumonia Vaccine 6535Years old  Completed   DEXA SCAN  Completed   COVID-19 Vaccine  Completed   Hepatitis C Screening  Completed   Zoster Vaccines- Shingrix  Completed   HPV VACCINES  Aged Out   Health Maintenance There are no preventive care reminders to display for this patient.  Lung Cancer Screening: (Low Dose CT Chest recommended if Age 74-80ears, 30 pack-year currently smoking OR have quit w/in 15years.) does not qualify.   Vision Screening: Recommended annual ophthalmology exams for early detection of glaucoma and other disorders of the eye.  Dental Screening: Recommended annual dental exams for proper oral hygiene.  Community Resource Referral / Chronic Care Management: CRR required this visit?  No   CCM required this visit?  No      Plan:   Keep all routine maintenance appointments.   I have personally reviewed and noted the following in the patient's chart:   Medical and social history Use of alcohol, tobacco or illicit drugs  Current medications and supplements  including opioid prescriptions.  Functional ability and status Nutritional status Physical activity Advanced directives List of other physicians Hospitalizations, surgeries, and ER visits in previous 12 months Vitals Screenings to include cognitive, depression, and falls Referrals and appointments  In addition, I have reviewed and discussed with patient certain preventive protocols, quality metrics, and best practice recommendations. A written personalized care plan for preventive services as well as general preventive health recommendations were provided to patient.     Varney Biles, LPN   07/23/1060

## 2021-11-26 ENCOUNTER — Encounter: Payer: Self-pay | Admitting: Oncology

## 2021-11-26 ENCOUNTER — Inpatient Hospital Stay: Payer: Medicare Other | Attending: Oncology | Admitting: Oncology

## 2021-11-26 DIAGNOSIS — Z0000 Encounter for general adult medical examination without abnormal findings: Secondary | ICD-10-CM | POA: Diagnosis not present

## 2021-11-26 DIAGNOSIS — Z170 Estrogen receptor positive status [ER+]: Secondary | ICD-10-CM

## 2021-11-26 DIAGNOSIS — C50411 Malignant neoplasm of upper-outer quadrant of right female breast: Secondary | ICD-10-CM

## 2021-11-26 DIAGNOSIS — Z853 Personal history of malignant neoplasm of breast: Secondary | ICD-10-CM | POA: Diagnosis not present

## 2021-11-26 DIAGNOSIS — Z780 Asymptomatic menopausal state: Secondary | ICD-10-CM | POA: Diagnosis not present

## 2021-11-26 DIAGNOSIS — Z1231 Encounter for screening mammogram for malignant neoplasm of breast: Secondary | ICD-10-CM

## 2021-11-26 MED ORDER — ALENDRONATE SODIUM 70 MG PO TABS: 70.0000 mg | ORAL_TABLET | ORAL | 3 refills | Status: DC

## 2021-11-26 NOTE — Progress Notes (Unsigned)
[place]  [phone] [phone]) [phone]  ID: Tiffany Chang OB: [birth year]  MR#: [id number]  CHE#:[id number]  Patient Care Team: Crecencio Mc, MD as PCP - General (Internal Medicine) Lloyd Huger, MD as Consulting Physician (Oncology)  I connected with Tiffany Chang on 11/27/21 at  3:30 PM EDT by video enabled telemedicine visit and verified that I am speaking with the correct person using two identifiers.   I discussed the limitations, risks, security and privacy concerns of performing an evaluation and management service by telemedicine and the availability of in-person appointments. I also discussed with the patient that there may be a patient responsible charge related to this service. The patient expressed understanding and agreed to proceed.   Other persons participating in the visit and their role in the encounter: Patient, MD.  Patient's location: Home. Provider's location: Clinic.  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  INTERVAL HISTORY: Patient agreed to video assisted telemedicine visit for further evaluation and discussion of her mammogram and bone density results.  She has an essential tremor is actively being worked up with imaging at [facility].  She otherwise feels well.  He has no other neurological complaints.  She denies any recent fevers or illnesses.  She has a good appetite and denies weight loss.  She denies any chest pain, shortness of breath, cough, or hemoptysis.  She denies any nausea, vomiting, constipation, or diarrhea. She has no urinary complaints.  Patient offers no further specific complaints today.  REVIEW OF SYSTEMS:   Review of Systems  Constitutional: Negative.  Negative for fever, malaise/fatigue and weight loss.  Respiratory: Negative.  Negative for cough and shortness of breath.   Cardiovascular: Negative.  Negative  for chest pain and leg swelling.  Gastrointestinal: Negative.  Negative for abdominal pain.  Genitourinary: Negative.  Negative for dysuria.  Musculoskeletal: Negative.  Negative for back pain.  Skin: Negative.  Negative for rash.  Neurological:  Positive for tremors. Negative for sensory change, focal weakness, weakness and headaches.  Psychiatric/Behavioral: Negative.  The patient is not nervous/anxious.     As per HPI. Otherwise, a complete review of systems is negative.  PAST MEDICAL HISTORY: Past Medical History:  Diagnosis Date   Breast cancer ([place]) 2016   [place] - RT MASTECTOMY   Hashimoto's thyroiditis    History of arthroscopy of left knee 1985   Hyperlipidemia    mild, with prior statin intolerance   Hypothyroid    Osteoporosis    prior Evista use x 5 yrs , 2007   Spinal stenosis    mild   Syncope, vasovagal    3 episodes   Tremor    Viral meningitis    76 years old    PAST SURGICAL HISTORY: Past Surgical History:  Procedure Laterality Date   CATARACT EXTRACTION W/PHACO Left 09/30/2017   Procedure: CATARACT EXTRACTION PHACO AND INTRAOCULAR LENS PLACEMENT ([place]);  Surgeon: Birder Robson, MD;  Location: [facility];  Service: Ophthalmology;  Laterality: Left;  [place] 00:40 AP% 15.7 CDE 6.38 Fluid pack lot # [id number]   CATARACT EXTRACTION W/PHACO Right 06/07/2019   Procedure: CATARACT EXTRACTION PHACO AND INTRAOCULAR LENS PLACEMENT ([facility]) RIGHT 4.62 00:33.5;  Surgeon: Birder Robson, MD;  Location: [place];  Service: Ophthalmology;  Laterality: Right;   CESAREAN SECTION     CHOLECYSTECTOMY  2006   COLONOSCOPY WITH PROPOFOL N/A 09/26/2019   Procedure: COLONOSCOPY WITH BIOPSY;  Surgeon: Lucilla Lame, MD;  Location: [place];  Service: Endoscopy;  Laterality: N/A;  priority 3   FOOT SURGERY     plantar fascitis, Dr. Elvina Mattes   KNEE ARTHROSCOPY  1985   right   MASTECTOMY Right 2016   [place]   POLYPECTOMY N/A 09/26/2019   Procedure: POLYPECTOMY;   Surgeon: Lucilla Lame, MD;  Location: [place];  Service: Endoscopy;  Laterality: N/A;   SENTINEL NODE BIOPSY Right 10/10/2014   Procedure: SENTINEL NODE BIOPSY;  Surgeon: Leonie Green, MD;  Location: [facility];  Service: General;  Laterality: Right;   SIMPLE MASTECTOMY WITH AXILLARY SENTINEL NODE BIOPSY Right 10/10/2014   Procedure: SIMPLE MASTECTOMY;  Surgeon: Leonie Green, MD;  Location: [facility];  Service: General;  Laterality: Right;   TONSILLECTOMY      FAMILY HISTORY Family History  Problem Relation Age of Onset   Osteoporosis Mother    Aortic aneurysm Father    Cancer Sister 2       Breast   Breast cancer Sister 38   Cancer Maternal Grandfather        breast   Breast cancer Maternal Grandmother 86       ADVANCED DIRECTIVES:    HEALTH MAINTENANCE: Social History   Tobacco Use   Smoking status: Never   Smokeless tobacco: Never  Vaping Use   Vaping Use: Never used  Substance Use Topics   Alcohol use: Yes    Alcohol/week: 3.0 standard drinks of alcohol    Types: 3 Standard drinks or equivalent per week    Comment: social, 2 times per week   Drug use: No     Colonoscopy:  PAP:  Bone density:  Lipid panel:  Allergies  Allergen Reactions   Statins Other (See Comments)     Leg cramps in high doses   Bee Venom Hives    Current Outpatient Medications  Medication Sig Dispense Refill   alendronate (FOSAMAX) 70 MG tablet Take 1 tablet (70 mg total) by mouth once a week. Take with a full glass of water on an empty stomach. 12 tablet 3   ALPRAZolam (XANAX) 0.25 MG tablet Take 1 tablet (0.25 mg total) by mouth 2 (two) times daily as needed for anxiety. 20 tablet 0   Cholecalciferol (VITAMIN D3) 50 MCG (2000 UT) TABS Take 1 tablet by mouth daily.      levothyroxine (SYNTHROID) 112 MCG tablet TAKE 1 TABLET BY MOUTH EVERY DAY 90 tablet 1   naproxen sodium (ALEVE) 220 MG tablet Take 220 mg by mouth 2 (two) times daily as needed (pain).      primidone (MYSOLINE) 250 MG tablet TAKE 1 TABLET BY MOUTH EVERY MORNING AND AT BEDTIME 180 tablet 0   propranolol (INDERAL) 40 MG tablet TAKE 1 TABLET BY MOUTH TWICE A DAY 180 tablet 0   rosuvastatin (CRESTOR) 5 MG tablet TAKE 1 TABLET BY MOUTH EVERY DAY 90 tablet 1   triamcinolone cream (KENALOG) 0.5 % Apply topically. (Patient not taking: Reported on 11/26/2021)     No current facility-administered medications for this visit.    OBJECTIVE: There were no vitals filed for this visit.    There is no height or weight on file to calculate BMI.    ECOG FS:0 - Asymptomatic  General: Well-developed, well-nourished, no acute distress. HEENT: Normocephalic. Neuro: Alert, answering all questions appropriately. Cranial nerves grossly intact. Psych: Normal affect.  LAB RESULTS:  Lab Results  Component Value Date   NA 139 08/05/2021  K 5.0 08/05/2021   CL 102 08/05/2021   CO2 30 08/05/2021   GLUCOSE 95 08/05/2021   BUN 16 08/05/2021   CREATININE 0.68 08/05/2021   CALCIUM 9.5 08/05/2021   PROT 6.7 08/05/2021   ALBUMIN 4.3 08/05/2021   AST 14 08/05/2021   ALT 20 08/05/2021   ALKPHOS 91 08/05/2021   BILITOT 0.4 08/05/2021   GFRNONAA >60 10/03/2014   GFRAA >60 10/03/2014    Lab Results  Component Value Date   WBC 6.2 08/10/2019   NEUTROABS 4.1 08/10/2019   HGB 14.2 08/10/2019   HCT 42.1 08/10/2019   MCV 88.8 08/10/2019   PLT 320.0 08/10/2019     STUDIES: MM 3D SCREEN BREAST UNI LEFT  Result Date: 11/21/2021 CLINICAL DATA:  Screening. EXAM: DIGITAL SCREENING UNILATERAL LEFT MAMMOGRAM WITH CAD AND TOMOSYNTHESIS TECHNIQUE: Left screening digital craniocaudal and mediolateral oblique mammograms were obtained. Left screening digital breast tomosynthesis was performed. The images were evaluated with computer-aided detection. COMPARISON:  Previous exam(s). ACR Breast Density Category c: The breast tissue is heterogeneously dense, which may obscure small masses. FINDINGS: The patient has  had a right mastectomy. There are no findings suspicious for malignancy. IMPRESSION: No mammographic evidence of malignancy. A result letter of this screening mammogram will be mailed directly to the patient. RECOMMENDATION: Screening mammogram in one year.  (Code:SM-L-54M) BI-RADS CATEGORY  1: Negative. Electronically Signed   By: Kristopher Oppenheim M.D.   On: 11/21/2021 13:55   DG Bone Density  Result Date: 11/20/2021 EXAM: DUAL X-RAY ABSORPTIOMETRY (DXA) FOR BONE MINERAL DENSITY IMPRESSION: Your patient Tiffany Chang completed a BMD test on 11/20/2021 using the [facility] (software version: 14.10) manufactured by [employer]. The following summarizes the results of our evaluation. Technologist: MTB PATIENT BIOGRAPHICAL: Name: Tiffany Chang, Tiffany Chang Patient ID: [id number] Birth Date: [birth year] Height: 67.0 in. Gender: Female Exam Date: 11/20/2021 Weight: 165.0 lbs. Indications: Hypothyroid, Postmenopausal, Caucasian, History of Breast Cancer, Height Loss, Advanced Age, Family Hx of Osteoporosis Fractures: Treatments: Calcium, Synthroid, Vit D DENSITOMETRY RESULTS: Site         Region      Measured Date Measured Age WHO Classification Young Adult T-score BMD         %Change vs. Previous Significant Change (*) AP Spine L1-L3 11/20/2021 73.8 Normal -0.9 1.075 g/cm2 6.9% Yes AP Spine L1-L3 11/12/2020 72.7 Osteopenia -1.4 1.006 g/cm2 -0.6% - AP Spine L1-L3 11/02/2019 71.7 Osteopenia -1.4 1.012 g/cm2 -2.3% - AP Spine L1-L3 11/01/2018 70.7 Osteopenia -1.2 1.036 g/cm2 -0.5% - AP Spine L1-L3 10/26/2017 69.7 Osteopenia -1.1 1.041 g/cm2 0.3% - AP Spine L1-L3 10/23/2016 68.7 Osteopenia -1.2 1.038 g/cm2 -5.6% Yes AP Spine L1-L3 10/23/2015 67.7 Normal -0.7 1.099 g/cm2 -0.2% - AP Spine L1-L3 10/31/2014 66.7 Normal -0.7 1.101 g/cm2 -0.5% - AP Spine L1-L3 08/31/2013 65.5 Normal -0.6 1.107 g/cm2 0.2% - AP Spine L1-L3 02/27/2010 62.0 Normal -0.6 1.105 g/cm2 - - DualFemur Total Right 11/20/2021 73.8 Osteopenia -1.5 0.818 g/cm2  0.0% - DualFemur Total Right 11/12/2020 72.7 Osteopenia -1.5 0.818 g/cm2 -0.4% - DualFemur Total Right 11/02/2019 71.7 Osteopenia -1.5 0.821 g/cm2 0.2% - DualFemur Total Right 11/01/2018 70.7 Osteopenia -1.5 0.819 g/cm2 0.0% - DualFemur Total Right 10/26/2017 69.7 Osteopenia -1.5 0.819 g/cm2 -0.6% - DualFemur Total Right 10/23/2016 68.7 Osteopenia -1.5 0.824 g/cm2 -4.3% Yes DualFemur Total Right 10/23/2015 67.7 Osteopenia -1.2 0.861 g/cm2 1.8% - DualFemur Total Right 10/31/2014 66.7 Osteopenia -1.3 0.846 g/cm2 1.1% - DualFemur Total Right 02/27/2010 62.0 Osteopenia -1.4 0.837 g/cm2 - - DualFemur  Total Mean 11/20/2021 73.8 Osteopenia -1.3 0.850 g/cm2 0.1% - DualFemur Total Mean 11/12/2020 72.7 Osteopenia -1.3 0.849 g/cm2 -0.5% - DualFemur Total Mean 11/02/2019 71.7 Osteopenia -1.2 0.853 g/cm2 0.8% - DualFemur Total Mean 11/01/2018 70.7 Osteopenia -1.3 0.846 g/cm2 -0.5% - DualFemur Total Mean 10/26/2017 69.7 Osteopenia -1.3 0.850 g/cm2 -1.5% - DualFemur Total Mean 10/23/2016 68.7 Osteopenia -1.1 0.863 g/cm2 -2.6% Yes DualFemur Total Mean 10/23/2015 67.7 Normal -1.0 0.886 g/cm2 -0.1% - DualFemur Total Mean 10/31/2014 66.7 Normal -1.0 0.887 g/cm2 0.6% - DualFemur Total Mean 02/27/2010 62.0 Normal -1.0 0.882 g/cm2 - - Left Forearm Radius 33% 11/20/2021 73.8 Osteoporosis -3.5 0.568 g/cm2 -4.1% - Left Forearm Radius 33% 10/26/2017 69.7 Osteoporosis -3.2 0.592 g/cm2 -6.9% Yes Left Forearm Radius 33% 10/23/2016 68.7 Osteoporosis -2.7 0.636 g/cm2 -2.6% - Left Forearm Radius 33% 10/31/2014 66.7 Osteoporosis -2.5 0.653 g/cm2 -3.1% - Left Forearm Radius 33% 08/31/2013 65.5 Osteopenia -2.3 0.674 g/cm2 -5.3% Yes Left Forearm Radius 33% 02/27/2010 62.0 Osteopenia -1.9 0.712 g/cm2 - - ASSESSMENT: The BMD measured at Forearm Radius 33% is 0.568 g/cm2 with a T-score of -3.5. This patient is considered osteoporotic according to [facility]) criteria. The scan quality is good. L-4 was excluded due to degenerative  changes. Compared with prior study, there has been significant increase in the spine. Compared with prior study, there has been no significant change in the total hip. [facility]) criteria for post-menopausal, Caucasian Women: Normal:                   T-score at or above -1 SD Osteopenia/low bone mass: T-score between -1 and -2.5 SD Osteoporosis:             T-score at or below -2.5 SD RECOMMENDATIONS: 1. All patients should optimize calcium and vitamin D intake. 2. Consider FDA-approved medical therapies in postmenopausal women and men aged 32 years and older, based on the following: a. A hip or vertebral(clinical or morphometric) fracture b. T-score < -2.5 at the femoral neck or spine after appropriate evaluation to exclude secondary causes c. Low bone mass (T-score between -1.0 and -2.5 at the femoral neck or spine) and a 10-year probability of a hip fracture > 3% or a 10-year probability of a major osteoporosis-related fracture > 20% based on the [place]-adapted WHO algorithm 3. Clinician judgment and/or patient preferences may indicate treatment for people with 10-year fracture probabilities above or below these levels FOLLOW-UP: People with diagnosed cases of osteoporosis or at high risk for fracture should have regular bone mineral density tests. For patients eligible for Medicare, routine testing is allowed once every 2 years. The testing frequency can be increased to one year for patients who have rapidly progressing disease, those who are receiving or discontinuing medical therapy to restore bone mass, or have additional risk factors. I have reviewed this report, and agree with the above findings. [facility] Electronically Signed   By: Franki Cabot M.D.   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  PLAN:    1. Stage Ia ER/PR positive HER-2 negative invasive  lobular adenocarcinoma of the upper outer quadrant of the right breast: Patient ultimately decided to have a full mastectomy in May 2016 therefore she did not require adjuvant XRT. Given the fact that her malignancy is lobular in nature, she did not require chemotherapy.  Patient completed 5 years of letrozole in approximately June  2023 and discontinued treatment.  Her most recent left screening mammogram on November 20, 2021 was reported as BI-RADS 1.  Repeat in July 2024.  Patient will have video-assisted telemedicine visit in 1 year for routine evaluation. 2.  Osteoporosis: Patient's most recent bone mineral density on November 20, 2021 was significantly worse with a T score of -3.5.  Previously her T score was -1.7.  She was given a prescription for Fosamax today and encouraged to continue to take calcium and vitamin D supplementation.  Repeat bone mineral density in July 2024 along with her mammogram as above. 3.  Essential tremor: Actively being worked up at [facility].  I provided 20 minutes of face-to-face video visit time during this encounter which included chart review, counseling, and coordination of care as documented above.   Patient expressed understanding and was in agreement with this plan. She also understands that She can call clinic at any time with any questions, concerns, or complaints.    Lloyd Huger, MD   11/27/2021 8:44 AM

## 2021-12-05 ENCOUNTER — Encounter: Payer: Self-pay | Admitting: Neurology

## 2021-12-06 ENCOUNTER — Other Ambulatory Visit: Payer: Self-pay | Admitting: Neurology

## 2021-12-06 DIAGNOSIS — G250 Essential tremor: Secondary | ICD-10-CM

## 2021-12-10 ENCOUNTER — Ambulatory Visit: Payer: Medicare Other | Admitting: Neurology

## 2021-12-22 ENCOUNTER — Encounter: Payer: Self-pay | Admitting: Neurology

## 2021-12-24 ENCOUNTER — Other Ambulatory Visit: Payer: Self-pay | Admitting: Internal Medicine

## 2022-01-31 NOTE — Progress Notes (Unsigned)
Assessment/Plan:    1.  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    -Continue propranolol, 40 mg twice per day.  -she will continue primidone 250 mg bid  -discussed 2nd line meds.  Ultimately, she decided to try Zonegran.  We will work up to 200 mg at bedtime.  R/b/se discussed.  -Patient asked very detailed questions today about deep brain stimulation, the preop and operative and postoperative procedures.  We discussed those in detail, along with risks and benefits.  Her husband was there and asked questions as well.  Patient will think about that.  If we were to consider it, I think she should just do the left VIM.  -Discussed [employer] and information given.  She may be interested in that.  Information given on readi steadi as well.  Subjective:   Tiffany Chang was seen today in follow up for essential tremor.  My previous records were reviewed prior to todays visit. She was last seen in November, 2022.  She underwent left thalamic focused ultrasound December 18, 2021.  Postoperatively, she had balance trouble and mouth paresthesias.  She followed up with the neurosurgeon September 5.  Notes are reviewed.  She had RAD weaned down some on the primidone and propranolol, but was told to follow-up here for further weaning.  Current prescribed movement disorder medications: Primidone, 150 mg twice per day Propranolol, 20 mg twice per day     ALLERGIES:   Allergies  Allergen Reactions   Statins Other (See Comments)     Leg cramps in high doses   Bee Venom Hives    CURRENT MEDICATIONS:  Outpatient Encounter Medications as of 02/04/2022  Medication Sig   alendronate (FOSAMAX) 70 MG tablet Take 1 tablet (70 mg total) by mouth once a week. Take with a full glass of water on an empty stomach.   ALPRAZolam (XANAX) 0.25 MG tablet Take 1 tablet (0.25 mg total) by mouth 2  (two) times daily as needed for anxiety.   Cholecalciferol (VITAMIN D3) 50 MCG (2000 UT) TABS Take 1 tablet by mouth daily.    levothyroxine (SYNTHROID) 112 MCG tablet TAKE 1 TABLET BY MOUTH EVERY DAY   naproxen sodium (ALEVE) 220 MG tablet Take 220 mg by mouth 2 (two) times daily as needed (pain).   primidone (MYSOLINE) 250 MG tablet TAKE 1 TABLET BY MOUTH EVERY MORNING AND AT BEDTIME   propranolol (INDERAL) 40 MG tablet TAKE 1 TABLET BY MOUTH TWICE A DAY   rosuvastatin (CRESTOR) 5 MG tablet TAKE 1 TABLET BY MOUTH EVERY DAY   triamcinolone cream (KENALOG) 0.5 % Apply topically. (Patient not taking: Reported on 11/26/2021)   No facility-administered encounter medications on file as of 02/04/2022.     Objective:    PHYSICAL EXAMINATION:    VITALS:   There were no vitals filed for this visit.    GEN:  The patient appears stated age and is in NAD. HEENT:  Normocephalic, atraumatic.  The mucous membranes are moist.   Neurological examination:  Orientation: The patient is alert and oriented x3. Cranial nerves: There is good facial symmetry. The speech is fluent and clear. Soft palate rises symmetrically and there is no tongue deviation. Hearing is intact to conversational tone. Sensation: Sensation is intact to light touch throughout Motor: Strength is at least antigravity x4.  Movement examination: Tone: There is normal tone  in the UE/LE Abnormal movements: no rest tremor.  No postural tremor.  Has mod intention tremor on the right.  Has difficulty with Archimedes spirals, right much more so than left.  I have reviewed and interpreted the following labs independently   Chemistry      Component Value Date/Time   NA 139 08/05/2021 1045   K 5.0 08/05/2021 1045   CL 102 08/05/2021 1045   CO2 30 08/05/2021 1045   BUN 16 08/05/2021 1045   CREATININE 0.68 08/05/2021 1045      Component Value Date/Time   CALCIUM 9.5 08/05/2021 1045   ALKPHOS 91 08/05/2021 1045   AST 14 08/05/2021  1045   ALT 20 08/05/2021 1045   BILITOT 0.4 08/05/2021 1045      Lab Results  Component Value Date   WBC 6.2 08/10/2019   HGB 14.2 08/10/2019   HCT 42.1 08/10/2019   MCV 88.8 08/10/2019   PLT 320.0 08/10/2019   Lab Results  Component Value Date   TSH 3.55 08/05/2021     Chemistry      Component Value Date/Time   NA 139 08/05/2021 1045   K 5.0 08/05/2021 1045   CL 102 08/05/2021 1045   CO2 30 08/05/2021 1045   BUN 16 08/05/2021 1045   CREATININE 0.68 08/05/2021 1045      Component Value Date/Time   CALCIUM 9.5 08/05/2021 1045   ALKPHOS 91 08/05/2021 1045   AST 14 08/05/2021 1045   ALT 20 08/05/2021 1045   BILITOT 0.4 08/05/2021 1045         Total time spent on today's visit was *** minutes, including both face-to-face time and nonface-to-face time.  Time included that spent on review of records (prior notes available to me/labs/imaging if pertinent), discussing treatment and goals, answering patient's questions and coordinating care.  Cc:  Crecencio Mc, MD

## 2022-01-31 NOTE — Patient Instructions (Incomplete)
Week 1 Continue primidone, 50 mg, 3 tablets twice per day Decrease propranolol, 20 mg once per day

## 2022-02-04 ENCOUNTER — Ambulatory Visit (INDEPENDENT_AMBULATORY_CARE_PROVIDER_SITE_OTHER): Payer: Medicare Other | Admitting: Neurology

## 2022-02-04 ENCOUNTER — Encounter: Payer: Self-pay | Admitting: Neurology

## 2022-02-04 VITALS — BP 123/83 | HR 67 | Ht 67.0 in | Wt 162.2 lb

## 2022-02-04 DIAGNOSIS — G250 Essential tremor: Secondary | ICD-10-CM

## 2022-02-04 MED ORDER — PROPRANOLOL HCL 10 MG PO TABS: ORAL_TABLET | ORAL | 0 refills | Status: DC

## 2022-02-05 ENCOUNTER — Encounter: Payer: Self-pay | Admitting: Internal Medicine

## 2022-02-05 ENCOUNTER — Ambulatory Visit (INDEPENDENT_AMBULATORY_CARE_PROVIDER_SITE_OTHER): Payer: Medicare Other | Admitting: Internal Medicine

## 2022-02-05 VITALS — BP 122/66 | HR 76 | Temp 98.2°F | Ht 67.25 in | Wt 162.4 lb

## 2022-02-05 DIAGNOSIS — M810 Age-related osteoporosis without current pathological fracture: Secondary | ICD-10-CM | POA: Diagnosis not present

## 2022-02-05 DIAGNOSIS — R7303 Prediabetes: Secondary | ICD-10-CM

## 2022-02-05 DIAGNOSIS — E039 Hypothyroidism, unspecified: Secondary | ICD-10-CM | POA: Diagnosis not present

## 2022-02-05 DIAGNOSIS — E785 Hyperlipidemia, unspecified: Secondary | ICD-10-CM | POA: Diagnosis not present

## 2022-02-05 DIAGNOSIS — G250 Essential tremor: Secondary | ICD-10-CM

## 2022-02-05 DIAGNOSIS — Z23 Encounter for immunization: Secondary | ICD-10-CM

## 2022-02-05 LAB — COMPREHENSIVE METABOLIC PANEL
ALT: 13 U/L (ref 0–35)
AST: 11 U/L (ref 0–37)
Albumin: 4.2 g/dL (ref 3.5–5.2)
Alkaline Phosphatase: 75 U/L (ref 39–117)
BUN: 16 mg/dL (ref 6–23)
CO2: 31 mEq/L (ref 19–32)
Calcium: 9.2 mg/dL (ref 8.4–10.5)
Chloride: 104 mEq/L (ref 96–112)
Creatinine, Ser: 0.64 mg/dL (ref 0.40–1.20)
GFR: 87.31 mL/min (ref >60.00)
Glucose, Bld: 98 mg/dL (ref 70–99)
Potassium: 4.7 mEq/L (ref 3.5–5.1)
Sodium: 140 mEq/L (ref 135–145)
Total Bilirubin: 0.4 mg/dL (ref 0.2–1.2)
Total Protein: 6.7 g/dL (ref 6.0–8.3)

## 2022-02-05 LAB — LIPID PANEL
Cholesterol: 174 mg/dL (ref 0–200)
HDL: 48.3 mg/dL (ref >39.00)
LDL Cholesterol: 104 mg/dL — ABNORMAL HIGH (ref 0–99)
NonHDL: 125.46
Total CHOL/HDL Ratio: 4
Triglycerides: 108 mg/dL (ref 0.0–149.0)
VLDL: 21.6 mg/dL (ref 0.0–40.0)

## 2022-02-05 LAB — TSH: TSH: 0.27 u[IU]/mL — ABNORMAL LOW (ref 0.35–5.50)

## 2022-02-05 LAB — HEMOGLOBIN A1C: Hgb A1c MFr Bld: 6 % (ref 4.6–6.5)

## 2022-02-05 LAB — MICROALBUMIN / CREATININE URINE RATIO
Creatinine,U: 95.5 mg/dL
Microalb Creat Ratio: 1.2 mg/g (ref 0.0–30.0)
Microalb, Ur: 1.1 mg/dL (ref 0.0–1.9)

## 2022-02-05 MED ORDER — RSVPREF3 VAC RECOMB ADJUVANTED 120 MCG/0.5ML IM SUSR: 0.5000 mL | Freq: Once | INTRAMUSCULAR | 0 refills | Status: AC

## 2022-02-05 NOTE — Progress Notes (Unsigned)
Subjective:  Patient ID: Tiffany Chang, female    DOB: 04-20-1946  Age: 76 y.o. MRN: [id number]  CC: The primary encounter diagnosis was Acquired hypothyroidism. 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   HPI Tiffany Chang presents for follow up on hypothyroidism, hyperliidenia and essential tremor Chief Complaint  Patient presents with   Follow-up    6 month follow up on hyperlipidemia and hypothyroidism   1) ET:  she is  s/p FUS left thalotomy adone t [facility] in August, which resulted in  100% resolution of dominant hand tremor.  Dr. Carles Collet is Weaning off of her propranolol .  Some balance issues  still .  Left hand tremor present,  continuing primidone    2) osteoporosis  of forearm :  started alendronate in July  ([place] ).  No esophageal pain.  Taking calcium and vitamin d.  3) hyperlipidemia:  taking rosuvastatin 5 mg daily.  No myalgias.     Outpatient Medications Prior to Visit  Medication Sig Dispense Refill   alendronate (FOSAMAX) 70 MG tablet Take 1 tablet (70 mg total) by mouth once a week. Take with a full glass of water on an empty stomach. 12 tablet 3   ALPRAZolam (XANAX) 0.25 MG tablet Take 1 tablet (0.25 mg total) by mouth 2 (two) times daily as needed for anxiety. 20 tablet 0   Cholecalciferol (VITAMIN D3) 50 MCG (2000 UT) TABS Take 1 tablet by mouth daily.      naproxen sodium (ALEVE) 220 MG tablet Take 220 mg by mouth 2 (two) times daily as needed (pain).     primidone (MYSOLINE) 250 MG tablet TAKE 1 TABLET BY MOUTH EVERY MORNING AND AT BEDTIME 180 tablet 0   propranolol (INDERAL) 10 MG tablet 1 po bid 60 tablet 0   rosuvastatin (CRESTOR) 5 MG tablet TAKE 1 TABLET BY MOUTH EVERY DAY 90 tablet 1   triamcinolone cream (KENALOG) 0.5 % Apply topically.     levothyroxine (SYNTHROID) 112  MCG tablet TAKE 1 TABLET BY MOUTH EVERY DAY 90 tablet 1   No facility-administered medications prior to visit.    Review of Systems;  Patient denies headache, fevers, malaise, unintentional weight loss, skin rash, eye pain, sinus congestion and sinus pain, sore throat, dysphagia,  hemoptysis , cough, dyspnea, wheezing, chest pain, palpitations, orthopnea, edema, abdominal pain, nausea, melena, diarrhea, constipation, flank pain, dysuria, hematuria, urinary  Frequency, nocturia, numbness, tingling, seizures,  Focal weakness, Loss of consciousness,  Tremor, insomnia, depression, anxiety, and suicidal ideation.      Objective:  BP 122/66 (BP Location: Left Arm, Patient Position: Sitting, Cuff Size: Normal)   Pulse 76   Temp 98.2 F (36.8 C) (Oral)   Ht 5' 7.25" (1.708 m)   Wt 162 lb 6.4 oz (73.7 kg)   SpO2 97%   BMI 25.25 kg/m   BP Readings from Last 3 Encounters:  02/05/22 122/66  02/04/22 123/83  08/05/21 138/78    Wt Readings from Last 3 Encounters:  02/05/22 162 lb 6.4 oz (73.7 kg)  02/04/22 162 lb 3.2 oz (73.6 kg)  11/22/21 165 lb (74.8 kg)    General appearance: alert, cooperative and appears stated age Ears: normal TM's and external ear canals both ears Throat: lips, mucosa, and tongue normal; teeth and gums normal Neck: no adenopathy, no carotid bruit, supple, symmetrical, trachea midline and thyroid not  enlarged, symmetric, no tenderness/mass/nodules Back: symmetric, no curvature. ROM normal. No CVA tenderness. Lungs: clear to auscultation bilaterally Heart: regular rate and rhythm, S1, S2 normal, no murmur, click, rub or gallop Abdomen: soft, non-tender; bowel sounds normal; no masses,  no organomegaly Pulses: 2+ and symmetric Skin: Skin color, texture, turgor normal. No rashes or lesions Lymph nodes: Cervical, supraclavicular, and axillary nodes normal. Neuro:  awake and interactive with normal mood and affect. Higher cortical functions are normal. Speech is clear  without word-finding difficulty or dysarthria. Extraocular movements are intact. Visual fields of both eyes are grossly intact. Sensation to light touch is grossly intact bilaterally of upper and lower extremities. Motor examination shows 455 symmetric hand grip and upper extremity and 5/5 lower extremity strength. There is no pronation or drift. Gait is non-ataxic . Right hand tremor has resolved, left hand tremor still present.   Lab Results  Component Value Date   HGBA1C 6.0 02/05/2022   HGBA1C 5.9 08/05/2021   HGBA1C 5.8 08/09/2020    Lab Results  Component Value Date   CREATININE 0.64 02/05/2022   CREATININE 0.68 08/05/2021   CREATININE 0.65 04/03/2021    Lab Results  Component Value Date   WBC 6.2 08/10/2019   HGB 14.2 08/10/2019   HCT 42.1 08/10/2019   PLT 320.0 08/10/2019   GLUCOSE 98 02/05/2022   CHOL 174 02/05/2022   TRIG 108.0 02/05/2022   HDL 48.30 02/05/2022   LDLDIRECT 218.0 08/05/2017   LDLCALC 104 (H) 02/05/2022   ALT 13 02/05/2022   AST 11 02/05/2022   NA 140 02/05/2022   K 4.7 02/05/2022   CL 104 02/05/2022   CREATININE 0.64 02/05/2022   BUN 16 02/05/2022   CO2 31 02/05/2022   TSH 0.27 (L) 02/05/2022   HGBA1C 6.0 02/05/2022   MICROALBUR 1.1 02/05/2022    MM 3D SCREEN BREAST UNI LEFT  Result Date: 11/21/2021 CLINICAL DATA:  Screening. EXAM: DIGITAL SCREENING UNILATERAL LEFT MAMMOGRAM WITH CAD AND TOMOSYNTHESIS TECHNIQUE: Left screening digital craniocaudal and mediolateral oblique mammograms were obtained. Left screening digital breast tomosynthesis was performed. The images were evaluated with computer-aided detection. COMPARISON:  Previous exam(s). ACR Breast Density Category c: The breast tissue is heterogeneously dense, which may obscure small masses. FINDINGS: The patient has had a right mastectomy. There are no findings suspicious for malignancy. IMPRESSION: No mammographic evidence of malignancy. A result letter of this screening mammogram will be  mailed directly to the patient. RECOMMENDATION: Screening mammogram in one year.  (Code:SM-L-77M) BI-RADS CATEGORY  1: Negative. Electronically Signed   By: Kristopher Oppenheim M.D.   On: 11/21/2021 13:55   DG Bone Density  Result Date: 11/20/2021 EXAM: DUAL X-RAY ABSORPTIOMETRY (DXA) FOR BONE MINERAL DENSITY IMPRESSION: Your patient Tiffany Chang completed a BMD test on 11/20/2021 using the [place] (software version: 14.10) manufactured by [employer]. The following summarizes the results of our evaluation. Technologist: MTB PATIENT BIOGRAPHICAL: Name: Tiffany Chang, Tiffany Chang Patient ID: [id number] Birth Date: 06-01-1945 Height: 67.0 in. Gender: Female Exam Date: 11/20/2021 Weight: 165.0 lbs. Indications: Hypothyroid, Postmenopausal, Caucasian, History of Breast Cancer, Height Loss, Advanced Age, Family Hx of Osteoporosis Fractures: Treatments: Calcium, Synthroid, Vit D DENSITOMETRY RESULTS: Site         Region      Measured Date Measured Age WHO Classification Young Adult T-score BMD         %Change vs. Previous Significant Change (*) AP Spine L1-L3 11/20/2023 73.8 Normal -0.9 1.075 g/cm2 6.9% Yes AP Spine  L1-L3 11/12/2020 72.7 Osteopenia -1.4 1.006 g/cm2 -0.6% - AP Spine L1-L3 11/02/2019 71.7 Osteopenia -1.4 1.012 g/cm2 -2.3% - AP Spine L1-L3 11/01/2018 70.7 Osteopenia -1.2 1.036 g/cm2 -0.5% - AP Spine L1-L3 10/26/2017 69.7 Osteopenia -1.1 1.041 g/cm2 0.3% - AP Spine L1-L3 10/23/2016 68.7 Osteopenia -1.2 1.038 g/cm2 -5.6% Yes AP Spine L1-L3 10/23/2015 67.7 Normal -0.7 1.099 g/cm2 -0.2% - AP Spine L1-L3 10/31/2014 66.7 Normal -0.7 1.101 g/cm2 -0.5% - AP Spine L1-L3 08/31/2013 65.5 Normal -0.6 1.107 g/cm2 0.2% - AP Spine L1-L3 02/27/2010 62.0 Normal -0.6 1.105 g/cm2 - - DualFemur Total Right 11/20/2021 73.8 Osteopenia -1.5 0.818 g/cm2 0.0% - DualFemur Total Right 11/12/2020 72.7 Osteopenia -1.5 0.818 g/cm2 -0.4% - DualFemur Total Right 11/02/2019 71.7 Osteopenia -1.5 0.821 g/cm2 0.2% - DualFemur Total Right  11/01/2018 70.7 Osteopenia -1.5 0.819 g/cm2 0.0% - DualFemur Total Right 10/26/2017 69.7 Osteopenia -1.5 0.819 g/cm2 -0.6% - DualFemur Total Right 10/23/2016 68.7 Osteopenia -1.5 0.824 g/cm2 -4.3% Yes DualFemur Total Right 10/23/2015 67.7 Osteopenia -1.2 0.861 g/cm2 1.8% - DualFemur Total Right 10/31/2014 66.7 Osteopenia -1.3 0.846 g/cm2 1.1% - DualFemur Total Right 02/27/2010 62.0 Osteopenia -1.4 0.837 g/cm2 - - DualFemur Total Mean 11/20/2021 73.8 Osteopenia -1.3 0.850 g/cm2 0.1% - DualFemur Total Mean 11/12/2020 72.7 Osteopenia -1.3 0.849 g/cm2 -0.5% - DualFemur Total Mean 11/02/2019 71.7 Osteopenia -1.2 0.853 g/cm2 0.8% - DualFemur Total Mean 11/01/2018 70.7 Osteopenia -1.3 0.846 g/cm2 -0.5% - DualFemur Total Mean 10/26/2017 69.7 Osteopenia -1.3 0.850 g/cm2 -1.5% - DualFemur Total Mean 10/23/2016 68.7 Osteopenia -1.1 0.863 g/cm2 -2.6% Yes DualFemur Total Mean 10/23/2015 67.7 Normal -1.0 0.886 g/cm2 -0.1% - DualFemur Total Mean 10/31/2014 66.7 Normal -1.0 0.887 g/cm2 0.6% - DualFemur Total Mean 02/27/2010 62.0 Normal -1.0 0.882 g/cm2 - - Left Forearm Radius 33% 11/20/2021 73.8 Osteoporosis -3.5 0.568 g/cm2 -4.1% - Left Forearm Radius 33% 10/26/2017 69.7 Osteoporosis -3.2 0.592 g/cm2 -6.9% Yes Left Forearm Radius 33% 10/23/2016 68.7 Osteoporosis -2.7 0.636 g/cm2 -2.6% - Left Forearm Radius 33% 10/31/2014 66.7 Osteoporosis -2.5 0.653 g/cm2 -3.1% - Left Forearm Radius 33% 08/31/2013 65.5 Osteopenia -2.3 0.674 g/cm2 -5.3% Yes Left Forearm Radius 33% 02/27/2010 62.0 Osteopenia -1.9 0.712 g/cm2 - - ASSESSMENT: The BMD measured at Forearm Radius 33% is 0.568 g/cm2 with a T-score of -3.5. This patient is considered osteoporotic according to [facility]) criteria. The scan quality is good. L-4 was excluded due to degenerative changes. Compared with prior study, there has been significant increase in the spine. Compared with prior study, there has been no significant change in the total hip. [employer] [facility]) criteria for post-menopausal, Caucasian Women: Normal:                   T-score at or above -1 SD Osteopenia/low bone mass: T-score between -1 and -2.5 SD Osteoporosis:             T-score at or below -2.5 SD RECOMMENDATIONS: 1. All patients should optimize calcium and vitamin D intake. 2. Consider FDA-approved medical therapies in postmenopausal women and men aged 5 years and older, based on the following: a. A hip or vertebral(clinical or morphometric) fracture b. T-score < -2.5 at the femoral neck or spine after appropriate evaluation to exclude secondary causes c. Low bone mass (T-score between -1.0 and -2.5 at the femoral neck or spine) and a 10-year probability of a hip fracture > 3% or a 10-year probability of a major osteoporosis-related fracture > 20% based on the [place]-adapted WHO algorithm 3. Clinician judgment and/or patient preferences  may indicate treatment for people with 10-year fracture probabilities above or below these levels FOLLOW-UP: People with diagnosed cases of osteoporosis or at high risk for fracture should have regular bone mineral density tests. For patients eligible for Medicare, routine testing is allowed once every 2 years. The testing frequency can be increased to one year for patients who have rapidly progressing disease, those who are receiving or discontinuing medical therapy to restore bone mass, or have additional risk factors. I have reviewed this report, and agree with the above findings. [facility] Electronically Signed   By: Franki Cabot M.D.   On: 11/20/2021 11:30    Assessment & Plan:   Problem List Items Addressed This Visit     Prediabetes    a1c is unchanged and fsting glucose is normal.  Continue lifestyle  Modifications including low GI diet and regular exercise.   Lab Results  Component Value Date   HGBA1C 6.0 02/05/2022         Relevant Orders   HgB A1c (Completed)   Urine Microalbumin w/creat. 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  LDL is at goal and LFTs are normal.  No changes today   Lab Results  Component Value Date   CHOL 174 02/05/2022   HDL 48.30 02/05/2022   LDLCALC 104 (H) 02/05/2022   LDLDIRECT 218.0 08/05/2017   TRIG 108.0 02/05/2022   CHOLHDL 4 02/05/2022   Lab Results  Component Value Date   ALT 13 02/05/2022   AST 11 02/05/2022   ALKPHOS 75 02/05/2022   BILITOT 0.4 02/05/2022           Relevant Orders   Lipid Profile (Completed)   Comp Met (CMET) (Completed)   Benign essential tremor    Right hand tremor has resolved s/p left FUS thalatomy.  propranolol wean in progress.  continuing primidone for left hand tremor      Acquired hypothyroidism - Primary   Relevant Orders   TSH (Completed)   Other Visit Diagnoses     Need for immunization against influenza       Relevant Orders   Flu Vaccine QUAD High Dose(Fluad) (Completed)       I spent a total of  30  minutes with this patient in a face to face visit on the date of this encounter reviewing the last office visit with me in March,   most recent visit with  Dr. Carles Collet , her neurologist ,  patient's diet and exercise habits, recent procedure at [facility],    and post visit ordering of testing and therapeutics.    Follow-up: Return in about 6 months (around 08/06/2022).   Crecencio Mc, MD

## 2022-02-05 NOTE — Patient Instructions (Signed)
I do recommend the RSV vaccine for you, .  It is now available at your pharmacy,  so I have sent a prescriptions to [facility]  for you to get AFTER your trip .

## 2022-02-06 ENCOUNTER — Other Ambulatory Visit: Payer: Self-pay | Admitting: Internal Medicine

## 2022-02-06 DIAGNOSIS — E039 Hypothyroidism, unspecified: Secondary | ICD-10-CM

## 2022-02-06 MED ORDER — LEVOTHYROXINE SODIUM 100 MCG PO TABS: 100.0000 ug | ORAL_TABLET | Freq: Every day | ORAL | 0 refills | Status: DC

## 2022-02-06 NOTE — Assessment & Plan Note (Signed)
Right hand tremor has resolved s/p left FUS thalatomy.  propranolol wean in progress.  continuing primidone for left hand tremor

## 2022-02-06 NOTE — Assessment & Plan Note (Signed)
Now on alendronate prescribed b y Dr Grayland Ormond for progression of T scores/

## 2022-02-06 NOTE — Assessment & Plan Note (Signed)
a1c is unchanged and fsting glucose is normal.  Continue lifestyle  Modifications including low GI diet and regular exercise.   Lab Results  Component Value Date   HGBA1C 6.0 02/05/2022

## 2022-02-06 NOTE — Assessment & Plan Note (Signed)
Managed with 5 mg crestor daily withotu adverse effects.  LDL is at goal and LFTs are normal.  No changes today   Lab Results  Component Value Date   CHOL 174 02/05/2022   HDL 48.30 02/05/2022   LDLCALC 104 (H) 02/05/2022   LDLDIRECT 218.0 08/05/2017   TRIG 108.0 02/05/2022   CHOLHDL 4 02/05/2022   Lab Results  Component Value Date   ALT 13 02/05/2022   AST 11 02/05/2022   ALKPHOS 75 02/05/2022   BILITOT 0.4 02/05/2022

## 2022-02-06 NOTE — Assessment & Plan Note (Signed)
DOSE REDUCED to 100 mcg from 112 gor suppressed TSH  Lab Results  Component Value Date   TSH 0.27 (L) 02/05/2022

## 2022-02-28 ENCOUNTER — Other Ambulatory Visit: Payer: Self-pay | Admitting: Neurology

## 2022-03-01 ENCOUNTER — Other Ambulatory Visit: Payer: Self-pay | Admitting: Neurology

## 2022-03-01 DIAGNOSIS — G250 Essential tremor: Secondary | ICD-10-CM

## 2022-03-28 ENCOUNTER — Encounter: Payer: Self-pay | Admitting: Internal Medicine

## 2022-03-28 DIAGNOSIS — E039 Hypothyroidism, unspecified: Secondary | ICD-10-CM

## 2022-04-24 ENCOUNTER — Other Ambulatory Visit (INDEPENDENT_AMBULATORY_CARE_PROVIDER_SITE_OTHER): Payer: Medicare Other

## 2022-04-24 DIAGNOSIS — E039 Hypothyroidism, unspecified: Secondary | ICD-10-CM | POA: Diagnosis not present

## 2022-04-24 LAB — TSH: TSH: 0.34 u[IU]/mL — ABNORMAL LOW (ref 0.35–5.50)

## 2022-04-25 MED ORDER — LEVOTHYROXINE SODIUM 88 MCG PO TABS: 88.0000 ug | ORAL_TABLET | Freq: Every day | ORAL | 1 refills | Status: DC

## 2022-04-25 NOTE — Assessment & Plan Note (Signed)
TSH still suppressed on 100 mcg reduce dose to 88 mcg  Lab Results  Component Value Date   TSH 0.34 (L) 04/24/2022

## 2022-04-25 NOTE — Addendum Note (Signed)
Addended by: Crecencio Mc on: 04/25/2022 12:52 PM   Modules accepted: Orders

## 2022-05-21 ENCOUNTER — Other Ambulatory Visit: Payer: Self-pay

## 2022-05-21 DIAGNOSIS — Z170 Estrogen receptor positive status [ER+]: Secondary | ICD-10-CM

## 2022-05-21 DIAGNOSIS — M810 Age-related osteoporosis without current pathological fracture: Secondary | ICD-10-CM

## 2022-05-26 ENCOUNTER — Other Ambulatory Visit: Payer: Self-pay

## 2022-05-26 ENCOUNTER — Encounter: Payer: Self-pay | Admitting: Internal Medicine

## 2022-05-26 DIAGNOSIS — Z1231 Encounter for screening mammogram for malignant neoplasm of breast: Secondary | ICD-10-CM

## 2022-05-26 DIAGNOSIS — Z170 Estrogen receptor positive status [ER+]: Secondary | ICD-10-CM

## 2022-05-31 ENCOUNTER — Other Ambulatory Visit: Payer: Self-pay | Admitting: Family

## 2022-06-05 ENCOUNTER — Encounter: Payer: Self-pay | Admitting: Internal Medicine

## 2022-06-05 MED ORDER — ROSUVASTATIN CALCIUM 5 MG PO TABS: 5.0000 mg | ORAL_TABLET | Freq: Every day | ORAL | 2 refills | Status: DC

## 2022-07-19 ENCOUNTER — Other Ambulatory Visit: Payer: Self-pay | Admitting: Neurology

## 2022-07-19 DIAGNOSIS — G250 Essential tremor: Secondary | ICD-10-CM

## 2022-08-13 ENCOUNTER — Ambulatory Visit (INDEPENDENT_AMBULATORY_CARE_PROVIDER_SITE_OTHER): Payer: Medicare Other | Admitting: Internal Medicine

## 2022-08-13 ENCOUNTER — Other Ambulatory Visit (HOSPITAL_COMMUNITY)
Admission: RE | Admit: 2022-08-13 | Discharge: 2022-08-13 | Disposition: A | Discharge status: Home / Self Care | Payer: Medicare Other | Source: Ambulatory Visit | Attending: Internal Medicine | Admitting: Internal Medicine

## 2022-08-13 ENCOUNTER — Encounter: Payer: Self-pay | Admitting: Internal Medicine

## 2022-08-13 VITALS — BP 150/80 | HR 75 | Temp 97.8°F | Ht 67.25 in | Wt 164.4 lb

## 2022-08-13 DIAGNOSIS — Z1151 Encounter for screening for human papillomavirus (HPV): Secondary | ICD-10-CM | POA: Insufficient documentation

## 2022-08-13 DIAGNOSIS — Z124 Encounter for screening for malignant neoplasm of cervix: Secondary | ICD-10-CM | POA: Diagnosis present

## 2022-08-13 DIAGNOSIS — M791 Myalgia, unspecified site: Secondary | ICD-10-CM

## 2022-08-13 DIAGNOSIS — D126 Benign neoplasm of colon, unspecified: Secondary | ICD-10-CM

## 2022-08-13 DIAGNOSIS — G250 Essential tremor: Secondary | ICD-10-CM

## 2022-08-13 DIAGNOSIS — E785 Hyperlipidemia, unspecified: Secondary | ICD-10-CM | POA: Diagnosis not present

## 2022-08-13 DIAGNOSIS — T466X5A Adverse effect of antihyperlipidemic and antiarteriosclerotic drugs, initial encounter: Secondary | ICD-10-CM

## 2022-08-13 DIAGNOSIS — E039 Hypothyroidism, unspecified: Secondary | ICD-10-CM

## 2022-08-13 DIAGNOSIS — Z8249 Family history of ischemic heart disease and other diseases of the circulatory system: Secondary | ICD-10-CM

## 2022-08-13 DIAGNOSIS — R7303 Prediabetes: Secondary | ICD-10-CM

## 2022-08-13 DIAGNOSIS — M810 Age-related osteoporosis without current pathological fracture: Secondary | ICD-10-CM

## 2022-08-13 LAB — COMPREHENSIVE METABOLIC PANEL
ALT: 15 U/L (ref 0–35)
AST: 14 U/L (ref 0–37)
Albumin: 4.5 g/dL (ref 3.5–5.2)
Alkaline Phosphatase: 70 U/L (ref 39–117)
BUN: 16 mg/dL (ref 6–23)
CO2: 33 mEq/L — ABNORMAL HIGH (ref 19–32)
Calcium: 9.3 mg/dL (ref 8.4–10.5)
Chloride: 102 mEq/L (ref 96–112)
Creatinine, Ser: 0.61 mg/dL (ref 0.40–1.20)
GFR: 88 mL/min (ref >60.00)
Glucose, Bld: 97 mg/dL (ref 70–99)
Potassium: 4.3 mEq/L (ref 3.5–5.1)
Sodium: 141 mEq/L (ref 135–145)
Total Bilirubin: 0.4 mg/dL (ref 0.2–1.2)
Total Protein: 7.1 g/dL (ref 6.0–8.3)

## 2022-08-13 LAB — MICROALBUMIN / CREATININE URINE RATIO
Creatinine,U: 47.3 mg/dL
Microalb Creat Ratio: 1.6 mg/g (ref 0.0–30.0)
Microalb, Ur: 0.7 mg/dL (ref 0.0–1.9)

## 2022-08-13 LAB — HEMOGLOBIN A1C: Hgb A1c MFr Bld: 6.1 % (ref 4.6–6.5)

## 2022-08-13 LAB — LDL CHOLESTEROL, DIRECT: Direct LDL: 109 mg/dL

## 2022-08-13 LAB — LIPID PANEL
Cholesterol: 187 mg/dL (ref 0–200)
HDL: 52 mg/dL (ref >39.00)
LDL Cholesterol: 108 mg/dL — ABNORMAL HIGH (ref 0–99)
NonHDL: 135.1
Total CHOL/HDL Ratio: 4
Triglycerides: 138 mg/dL (ref 0.0–149.0)
VLDL: 27.6 mg/dL (ref 0.0–40.0)

## 2022-08-13 LAB — TSH: TSH: 2.24 u[IU]/mL (ref 0.35–5.50)

## 2022-08-13 LAB — CK: Total CK: 49 U/L (ref 7–177)

## 2022-08-13 MED ORDER — LEVOTHYROXINE SODIUM 88 MCG PO TABS: 88.0000 ug | ORAL_TABLET | Freq: Every day | ORAL | 1 refills | Status: DC

## 2022-08-13 NOTE — Assessment & Plan Note (Addendum)
Right hand  (dominant) tremor has resolved s/p left FUS thalatomy.  Post procedure adverse effect on balance now resolved.  Still taking propranolol 10 mg  bid and  primidone for left hand tremor; sees [name] in June

## 2022-08-13 NOTE — Assessment & Plan Note (Signed)
Taking statin ,  wants to suspend for several months

## 2022-08-13 NOTE — Patient Instructions (Signed)
Good to see you! Give Tiffany Chang a big hug for me,     I agree with suspending Crestor as a trial   You are due for your tetanus-diptheria-pertussis vaccine   (TDaP)   but you can get it for FREE  at your pharmacy

## 2022-08-13 NOTE — Assessment & Plan Note (Signed)
TSH still suppressed on 100 mcg reduce dose to 88 mcg . Repeat is due

## 2022-08-13 NOTE — Progress Notes (Unsigned)
Patient ID: Tiffany Chang, female    DOB: 1946-03-24  Age: 77 y.o. MRN: [id number]  The patient is here for follow up and  management of other chronic and acute problems.   The risk factors are reflected in the social history.  The roster of all physicians providing medical care to patient - is listed in the Snapshot section of the chart.  Activities of daily living:  The patient is 100% independent in all ADLs: dressing, toileting, feeding as well as independent mobility  Home safety : The patient has smoke detectors in the home. They wear seatbelts.  There are no firearms at home. There is no violence in the home.   There is no risks for hepatitis, STDs or HIV. There is no   history of blood transfusion. They have no travel history to infectious disease endemic areas of the world.  The patient has seen their dentist in the last six month. They have seen their eye doctor in the last year. They admit to slight hearing difficulty with regard to whispered voices and some television programs.  They have deferred audiologic testing in the last year.  They do not  have excessive sun exposure. Discussed the need for sun protection: hats, long sleeves and use of sunscreen if there is significant sun exposure.   Diet: the importance of a healthy diet is discussed. They do have a healthy diet.  The benefits of regular aerobic exercise were discussed. She walks 4 times per week ,  20 minutes.   Depression screen: there are no signs or vegative symptoms of depression- irritability, change in appetite, anhedonia, sadness/tearfullness.  Cognitive assessment: the patient manages all their financial and personal affairs and is actively engaged. They could relate day,date,year and events; recalled 2/3 objects at 3 minutes; performed clock-face test normally.  The following portions of the patient's history were reviewed and updated as appropriate: allergies, current medications, past family history, past  medical history,  past surgical history, past social history  and problem list.  Visual acuity was not assessed per patient preference since she has regular follow up with her ophthalmologist. Hearing and body mass index were assessed and reviewed.   During the course of the visit the patient was educated and counseled about appropriate screening and preventive services including : fall prevention , diabetes screening, nutrition counseling, colorectal cancer screening, and recommended immunizations.    CC: The primary encounter diagnosis was Hyperlipidemia, unspecified hyperlipidemia type. Diagnoses of Acquired hypothyroidism, Prediabetes, and Cervical cancer screening were also pertinent to this visit.  1) wants to stop statin for a while  due to joint pain.  No prior imaging.  FH of ruptured aortic aneurysm (father)     2) HTN:    3) s/p focused ultrasound of thalamus for treatment of tremor followed by PT because od adverse effect on her balance  History Angellena has a past medical history of Breast cancer ([place]) (2016), Hashimoto's thyroiditis, History of arthroscopy of left knee (1985), Hyperlipidemia, Hypothyroid, Osteoporosis, Spinal stenosis, Syncope, vasovagal, Tremor, and Viral meningitis.   She has a past surgical history that includes Foot surgery; Knee arthroscopy (1985); Cholecystectomy (2006); Tonsillectomy; Cesarean section; Simple mastectomy with axillary sentinel node biopsy (Right, 10/10/2014); Sentinel node biopsy (Right, 10/10/2014); Mastectomy (Right, 2016); Cataract extraction w/PHACO (Left, 09/30/2017); Cataract extraction w/PHACO (Right, 06/07/2019); Colonoscopy with propofol (N/A, 09/26/2019); and polypectomy (N/A, 09/26/2019).   Her family history includes Aortic aneurysm in her father; Breast cancer (age of onset: 41) in her sister;  Breast cancer (age of onset: 82) in her maternal grandmother; Cancer in her maternal grandfather; Cancer (age of onset: 78) in her sister; Osteoporosis  in her mother.She reports that she has never smoked. She has never used smokeless tobacco. She reports current alcohol use of about 3.0 standard drinks of alcohol per week. She reports that she does not use drugs.  Outpatient Medications Prior to Visit  Medication Sig Dispense Refill   alendronate (FOSAMAX) 70 MG tablet Take 1 tablet (70 mg total) by mouth once a week. Take with a full glass of water on an empty stomach. 12 tablet 3   Cholecalciferol (VITAMIN D3) 50 MCG (2000 UT) TABS Take 1 tablet by mouth daily.      levothyroxine (SYNTHROID) 88 MCG tablet Take 1 tablet (88 mcg total) by mouth daily. 90 tablet 1   naproxen sodium (ALEVE) 220 MG tablet Take 220 mg by mouth 2 (two) times daily as needed (pain).     primidone (MYSOLINE) 250 MG tablet TAKE 1/2 TABLET BY MOUTH EVERY MORNING AND AT BEDTIME 180 tablet 0   propranolol (INDERAL) 10 MG tablet TAKE 1 TABLET BY MOUTH TWICE A DAY 180 tablet 1   rosuvastatin (CRESTOR) 5 MG tablet Take 1 tablet (5 mg total) by mouth daily. 90 tablet 2   triamcinolone cream (KENALOG) 0.5 % Apply topically.     ALPRAZolam (XANAX) 0.25 MG tablet Take 1 tablet (0.25 mg total) by mouth 2 (two) times daily as needed for anxiety. (Patient not taking: Reported on 08/13/2022) 20 tablet 0   No facility-administered medications prior to visit.    Review of Systems  Objective:  BP (!) 152/84   Pulse 75   Temp 97.8 F (36.6 C) (Oral)   Ht 5' 7.25" (1.708 m)   Wt 164 lb 6.4 oz (74.6 kg)   SpO2 97%   BMI 25.56 kg/m   Physical Exam  Assessment & Plan:  Hyperlipidemia, unspecified hyperlipidemia type  Acquired hypothyroidism  Prediabetes  Cervical cancer screening      I provided 40 minutes of  face-to-face time during this encounter reviewing patient's current problems and past surgeries,  recent labs and imaging studies, providing counseling on the above mentioned problems , and coordination  of care .   Follow-up: No follow-ups on  file.   Crecencio Mc, MD

## 2022-08-14 DIAGNOSIS — Z124 Encounter for screening for malignant neoplasm of cervix: Secondary | ICD-10-CM | POA: Insufficient documentation

## 2022-08-14 LAB — CYTOLOGY - PAP
Comment: NEGATIVE
Diagnosis: NEGATIVE
High risk HPV: NEGATIVE

## 2022-08-14 NOTE — Assessment & Plan Note (Signed)
5 yr follow up is due in 2026  

## 2022-08-14 NOTE — Assessment & Plan Note (Addendum)
Now on alendronate after 5 years of EVista.  Alendronate prescribed by Dr Grayland Ormond for progression of T scores. BMD in spine has improved from 2022 to 2023

## 2022-08-14 NOTE — Assessment & Plan Note (Signed)
PAP smear was attempted per patient request but cervix was not visible due to atrophy and vaginal stenosis.

## 2022-09-26 ENCOUNTER — Other Ambulatory Visit (INDEPENDENT_AMBULATORY_CARE_PROVIDER_SITE_OTHER): Payer: Self-pay

## 2022-09-26 ENCOUNTER — Encounter (INDEPENDENT_AMBULATORY_CARE_PROVIDER_SITE_OTHER): Payer: Self-pay | Admitting: Vascular Surgery

## 2022-10-16 ENCOUNTER — Other Ambulatory Visit (INDEPENDENT_AMBULATORY_CARE_PROVIDER_SITE_OTHER): Payer: Self-pay | Admitting: Nurse Practitioner

## 2022-10-16 DIAGNOSIS — Z8249 Family history of ischemic heart disease and other diseases of the circulatory system: Secondary | ICD-10-CM

## 2022-10-17 ENCOUNTER — Telehealth (INDEPENDENT_AMBULATORY_CARE_PROVIDER_SITE_OTHER): Payer: Self-pay

## 2022-10-17 NOTE — Progress Notes (Unsigned)
Assessment/Plan:    1.  Essential Tremor  -She is s/p left thalamic focused ultrasound December 18, 2021 at [facility].  She asked about doing the other hand, but its not that bothersome and I don't recommend it.  She doesn't disagree  -discussed weaning vs staying on the medications.  She would like to stay on some for the L hand  -Decrease propranolol 10 mg, 2 in the AM, 1 in the evening for a week, then decrease to 1 twice per day for a week and then 1 in the AM and then stop.  Told them to monitor BP as her PCP has been concerned that it is high.  With stopping this, her BP could certainly raise up  -continue primidone 250 mg, 1/2 po bid.    -f/u 9 months   Subjective:   Tiffany Chang was seen today in follow up for essential tremor.  My previous records were reviewed prior to todays visit. She was last seen in November, 2022.  She underwent left thalamic focused ultrasound December 18, 2021.  Last visit, we weaned her off of the propranolol.  She reports she is still on that.  She never went attempted the weaning schedule.  She is still on the primidone.  She wanted to stay on some medication because of tremor in the left hand.  She is willing to trial to wean the propranolol.  Does state that BP has been elevated at home.  She has appt with pcp in july  Current prescribed movement disorder medications: Primidone, 250, 1/2 tablet twice per day  ALLERGIES:   Allergies  Allergen Reactions   Statins Other (See Comments)     Leg cramps in high doses   Bee Venom Hives    CURRENT MEDICATIONS:  Outpatient Encounter Medications as of 10/21/2022  Medication Sig   alendronate (FOSAMAX) 70 MG tablet Take 1 tablet (70 mg total) by mouth once a week. Take with a full glass of water on an empty stomach.   Cholecalciferol (VITAMIN D3) 50 MCG (2000 UT) TABS Take 1 tablet by mouth daily.    levothyroxine (SYNTHROID) 88 MCG tablet Take 1 tablet (88 mcg total) by mouth daily.   naproxen sodium (ALEVE) 220 MG  tablet Take 220 mg by mouth 2 (two) times daily as needed (pain).   primidone (MYSOLINE) 250 MG tablet TAKE 1/2 TABLET BY MOUTH EVERY MORNING AND AT BEDTIME   propranolol (INDERAL) 10 MG tablet TAKE 1 TABLET BY MOUTH TWICE A DAY   triamcinolone cream (KENALOG) 0.5 % Apply topically.   [DISCONTINUED] ALPRAZolam (XANAX) 0.25 MG tablet Take 1 tablet (0.25 mg total) by mouth 2 (two) times daily as needed for anxiety. (Patient not taking: Reported on 08/13/2022)   [DISCONTINUED] rosuvastatin (CRESTOR) 5 MG tablet Take 1 tablet (5 mg total) by mouth daily.   No facility-administered encounter medications on file as of 10/21/2022.     Objective:    PHYSICAL EXAMINATION:    VITALS:   Vitals:   10/21/22 0902  BP: 124/72  Pulse: 71  SpO2: 96%  Weight: 165 lb 12.8 oz (75.2 kg)  Height: 5\' 7"  (1.702 m)    GEN:  The patient appears stated age and is in NAD. HEENT:  Normocephalic, atraumatic.  The mucous membranes are moist.  CV:  RRR Lungs:  CTAB Neck.  No bruits  Neurological examination:  Orientation: The patient is alert and oriented x3. Cranial nerves: There is good facial symmetry. The speech is fluent  and clear. Soft palate rises symmetrically and there is no tongue deviation. Hearing is intact to conversational tone. Sensation: Sensation is intact to light touch throughout Motor: Strength is at least antigravity x4.  Movement examination: Tone: There is normal tone in the UE/LE Abnormal movements: no rest tremor.  No postural tremor.  Has no intention tremor on the R but has mild to mod on the L. This is stable c/t previous.   She has good archimedes spirals on the R but has trouble on the L Gait/station:  ambulates well in the hall with good arm swing b/l.    I have reviewed and interpreted the following labs independently   Chemistry      Component Value Date/Time   NA 141 08/13/2022 0957   K 4.3 08/13/2022 0957   CL 102 08/13/2022 0957   CO2 33 (H) 08/13/2022 0957   BUN  16 08/13/2022 0957   CREATININE 0.61 08/13/2022 0957      Component Value Date/Time   CALCIUM 9.3 08/13/2022 0957   ALKPHOS 70 08/13/2022 0957   AST 14 08/13/2022 0957   ALT 15 08/13/2022 0957   BILITOT 0.4 08/13/2022 0957      Lab Results  Component Value Date   WBC 6.2 08/10/2019   HGB 14.2 08/10/2019   HCT 42.1 08/10/2019   MCV 88.8 08/10/2019   PLT 320.0 08/10/2019   Lab Results  Component Value Date   TSH 2.24 08/13/2022     Chemistry      Component Value Date/Time   NA 141 08/13/2022 0957   K 4.3 08/13/2022 0957   CL 102 08/13/2022 0957   CO2 33 (H) 08/13/2022 0957   BUN 16 08/13/2022 0957   CREATININE 0.61 08/13/2022 0957      Component Value Date/Time   CALCIUM 9.3 08/13/2022 0957   ALKPHOS 70 08/13/2022 0957   AST 14 08/13/2022 0957   ALT 15 08/13/2022 0957   BILITOT 0.4 08/13/2022 0957         Total time spent on today's visit was 31 minutes, including both face-to-face time and nonface-to-face time.  Time included that spent on review of records (prior notes available to me/labs/imaging if pertinent), discussing treatment and goals, answering patient's questions and coordinating care.  Cc:  Sherlene Shams, MD

## 2022-10-17 NOTE — Telephone Encounter (Signed)
Patient called stating she has an Aortic ultrasound coming up next week and she was wondering if it was a fast procedure.  Per Tiffany Chang and Tiffany Chang the procedure is Fasting and she can not have anything past 12 am

## 2022-10-20 ENCOUNTER — Encounter: Payer: Self-pay | Admitting: Internal Medicine

## 2022-10-20 NOTE — Telephone Encounter (Signed)
Patient called office and has been scheduled to see Dr. Duncan Dull on 11/24/2022.

## 2022-10-20 NOTE — Telephone Encounter (Signed)
noted 

## 2022-10-21 ENCOUNTER — Ambulatory Visit (INDEPENDENT_AMBULATORY_CARE_PROVIDER_SITE_OTHER): Payer: Medicare Other | Admitting: Neurology

## 2022-10-21 ENCOUNTER — Encounter (INDEPENDENT_AMBULATORY_CARE_PROVIDER_SITE_OTHER): Payer: Self-pay

## 2022-10-21 ENCOUNTER — Encounter (INDEPENDENT_AMBULATORY_CARE_PROVIDER_SITE_OTHER): Payer: Self-pay | Admitting: Vascular Surgery

## 2022-10-21 ENCOUNTER — Encounter: Payer: Self-pay | Admitting: Neurology

## 2022-10-21 ENCOUNTER — Ambulatory Visit (INDEPENDENT_AMBULATORY_CARE_PROVIDER_SITE_OTHER): Payer: Self-pay

## 2022-10-21 VITALS — BP 124/72 | HR 71 | Ht 67.0 in | Wt 165.8 lb

## 2022-10-21 DIAGNOSIS — Z8249 Family history of ischemic heart disease and other diseases of the circulatory system: Secondary | ICD-10-CM

## 2022-10-21 DIAGNOSIS — G250 Essential tremor: Secondary | ICD-10-CM | POA: Diagnosis not present

## 2022-10-21 NOTE — Patient Instructions (Addendum)
Decrease propranolol 10 mg, 2 in the AM, 1 in the evening for a week, then decrease to 1 twice per day for a week and then 1 in the AM and then stop.    The physicians and staff at [facility] are committed to providing excellent care. You may receive a survey requesting feedback about your experience at our office. We strive to receive "very good" responses to the survey questions. If you feel that your experience would prevent you from giving the office a "very good " response, please contact our office to try to remedy the situation. We may be reached at [phone]. Thank you for taking the time out of your busy day to complete the survey.

## 2022-10-28 ENCOUNTER — Encounter: Payer: Self-pay | Admitting: Neurology

## 2022-10-29 ENCOUNTER — Other Ambulatory Visit: Payer: Self-pay

## 2022-10-29 ENCOUNTER — Other Ambulatory Visit: Payer: Self-pay | Admitting: Oncology

## 2022-10-29 MED ORDER — PROPRANOLOL HCL 10 MG PO TABS: 10.0000 mg | ORAL_TABLET | Freq: Two times a day (BID) | ORAL | 0 refills | Status: DC

## 2022-11-05 ENCOUNTER — Telehealth: Payer: Self-pay | Admitting: Internal Medicine

## 2022-11-05 NOTE — Telephone Encounter (Signed)
Copied from CRM [id number]. Topic: Medicare AWV >> Nov 05, 2022 11:00 AM Payton Doughty wrote: Reason for CRM: LM 11/05/2022 to schedule AWV   Verlee Rossetti; Care Guide Ambulatory Clinical Support [place] [facility] Direct Dial: [phone]

## 2022-11-22 ENCOUNTER — Other Ambulatory Visit: Payer: Self-pay | Admitting: Neurology

## 2022-11-22 DIAGNOSIS — G250 Essential tremor: Secondary | ICD-10-CM

## 2022-11-24 ENCOUNTER — Ambulatory Visit
Admission: RE | Admit: 2022-11-24 | Discharge: 2022-11-24 | Disposition: A | Discharge status: Home / Self Care | Payer: Medicare Other | Source: Ambulatory Visit | Attending: Oncology | Admitting: Oncology

## 2022-11-24 ENCOUNTER — Encounter: Payer: Self-pay | Admitting: Internal Medicine

## 2022-11-24 ENCOUNTER — Ambulatory Visit (INDEPENDENT_AMBULATORY_CARE_PROVIDER_SITE_OTHER): Payer: Medicare Other | Admitting: Internal Medicine

## 2022-11-24 VITALS — BP 136/74 | HR 83 | Temp 98.7°F | Ht 67.0 in | Wt 166.0 lb

## 2022-11-24 DIAGNOSIS — R030 Elevated blood-pressure reading, without diagnosis of hypertension: Secondary | ICD-10-CM

## 2022-11-24 DIAGNOSIS — M810 Age-related osteoporosis without current pathological fracture: Secondary | ICD-10-CM | POA: Diagnosis present

## 2022-11-24 DIAGNOSIS — E039 Hypothyroidism, unspecified: Secondary | ICD-10-CM

## 2022-11-24 DIAGNOSIS — Z170 Estrogen receptor positive status [ER+]: Secondary | ICD-10-CM | POA: Insufficient documentation

## 2022-11-24 DIAGNOSIS — Z1231 Encounter for screening mammogram for malignant neoplasm of breast: Secondary | ICD-10-CM | POA: Diagnosis present

## 2022-11-24 DIAGNOSIS — C50411 Malignant neoplasm of upper-outer quadrant of right female breast: Secondary | ICD-10-CM | POA: Insufficient documentation

## 2022-11-24 DIAGNOSIS — I10 Essential (primary) hypertension: Secondary | ICD-10-CM | POA: Insufficient documentation

## 2022-11-24 DIAGNOSIS — Z853 Personal history of malignant neoplasm of breast: Secondary | ICD-10-CM

## 2022-11-24 DIAGNOSIS — Z8249 Family history of ischemic heart disease and other diseases of the circulatory system: Secondary | ICD-10-CM

## 2022-11-24 MED ORDER — LEVOTHYROXINE SODIUM 88 MCG PO TABS: 88.0000 ug | ORAL_TABLET | Freq: Every day | ORAL | 1 refills | Status: DC

## 2022-11-24 NOTE — Assessment & Plan Note (Signed)
Home readings have been quite labile and range from normal (<120 systolic) to 170 systolic.  Advised to suspend NSAIDS for one week (use of tylenol 1000 mg every 8 hours advised) and check BP daily and submit

## 2022-11-24 NOTE — Assessment & Plan Note (Addendum)
Stopped letrozole in December.  Tiffany Chang in June. She is undecided about pursuing genetic testing :  her MDM and one sister had BRCA .  She has one daughter

## 2022-11-24 NOTE — Patient Instructions (Addendum)
The Aleve you have been taking for your hip pain may be causing the elevated readings for  the 24 hour period following your last dose.    All NSAIDs can do this :  (meloxicam, Advil, Aleve)  For the next week,  no NSAID.  Use  1000 mg tylenol every 8 hours for your  hip pain and check BP daily  send me a weeks worth of daily readings via mychart     You are due for your tetanus-diptheria-pertussis vaccine   (TDaP)   You can  get it for free at your pharmacy

## 2022-11-24 NOTE — Progress Notes (Unsigned)
Subjective:  Patient ID: Tiffany Chang, female    DOB: [birth year]  Age: 77 y.o. MRN: [id number]  CC: The primary encounter diagnosis was Family history of abdominal aortic aneurysm. Diagnoses of Acquired hypothyroidism, History of breast cancer in adulthood, and Elevated blood pressure reading without diagnosis of hypertension were also pertinent to this visit.   HPI Tiffany Chang presents for  Chief Complaint  Patient presents with   Medical Management of Chronic Issues    hypertension   1) HTN:  Patient is taking her medications as prescribed and notes no adverse effects.  Home BP readings have been done about once per week and ave been labile  ranging from   are  generally  130/80 or within 5 pts either way . She reports that she was taking meloxicam for the month of April/May.  Since then she has been taking Aleve for bilateral hip discomfort. And the normal reading on July 7 was notably 48 to 72 hours after her last dose of Aleve.    2) Bilateral hip issues:  denies real pain,  but Has an appt with ortho in [place] In the near .  Still stretching and swimming daily , walking 2 miles daily   3)  h/o BRCA :  has had her annual mammogram today along with DEXA.  Has in the past considered having genetic testing  for BRCA genetic mutations, since she has one daughter.  Tiffany Chang who is already getting screening  annually.     4) Frustrated a inability to shed 10 lbs.  Reviewed weights,  lifestyle.   Outpatient Medications Prior to Visit  Medication Sig Dispense Refill   alendronate (FOSAMAX) 70 MG tablet TAKE 1 TABLET (70 MG TOTAL) BY MOUTH ONCE A WEEK. TAKE WITH A FULL GLASS OF WATER ON AN EMPTY STOMACH. 12 tablet 3   Cholecalciferol (VITAMIN D3) 50 MCG (2000 UT) TABS Take 1 tablet by mouth daily.      naproxen sodium (ALEVE) 220 MG tablet Take 220 mg by mouth 2 (two) times daily as needed (pain).     primidone (MYSOLINE) 250 MG tablet TAKE 1/2 TABLET BY MOUTH EVERY MORNING AND AT BEDTIME  90 tablet 0   propranolol (INDERAL) 10 MG tablet Take 1 tablet (10 mg total) by mouth 2 (two) times daily. 180 tablet 0   triamcinolone cream (KENALOG) 0.5 % Apply topically.     levothyroxine (SYNTHROID) 88 MCG tablet Take 1 tablet (88 mcg total) by mouth daily. 90 tablet 1   No facility-administered medications prior to visit.    Review of Systems;  Patient denies headache, fevers, malaise, unintentional weight loss, skin rash, eye pain, sinus congestion and sinus pain, sore throat, dysphagia,  hemoptysis , cough, dyspnea, wheezing, chest pain, palpitations, orthopnea, edema, abdominal pain, nausea, melena, diarrhea, constipation, flank pain, dysuria, hematuria, urinary  Frequency, nocturia, numbness, tingling, seizures,  Focal weakness, Loss of consciousness,  Tremor, insomnia, depression, anxiety, and suicidal ideation.      Objective:  BP 136/74   Pulse 83   Temp 98.7 F (37.1 C) (Oral)   Ht 5\' 7"  (1.702 m)   Wt 166 lb (75.3 kg)   SpO2 96%   BMI 26.00 kg/m   BP Readings from Last 3 Encounters:  11/24/22 136/74  10/21/22 124/72  08/13/22 (!) 150/80    Wt Readings from Last 3 Encounters:  11/24/22 166 lb (75.3 kg)  10/21/22 165 lb 12.8 oz (75.2 kg)  08/13/22 164 lb 6.4 oz (74.6  kg)    Physical Exam Vitals reviewed.  Constitutional:      General: She is not in acute distress.    Appearance: Normal appearance. She is normal weight. She is not ill-appearing, toxic-appearing or diaphoretic.  HENT:     Head: Normocephalic.  Eyes:     General: No scleral icterus.       Right eye: No discharge.        Left eye: No discharge.     Conjunctiva/sclera: Conjunctivae normal.  Cardiovascular:     Rate and Rhythm: Normal rate and regular rhythm.     Heart sounds: Normal heart sounds.  Pulmonary:     Effort: Pulmonary effort is normal. No respiratory distress.     Breath sounds: Normal breath sounds.  Musculoskeletal:        General: Normal range of motion.  Skin:     General: Skin is warm and dry.  Neurological:     General: No focal deficit present.     Mental Status: She is alert and oriented to person, place, and time. Mental status is at baseline.  Psychiatric:        Mood and Affect: Mood normal.        Behavior: Behavior normal.        Thought Content: Thought content normal.        Judgment: Judgment normal.    Lab Results  Component Value Date   HGBA1C 6.1 08/13/2022   HGBA1C 6.0 02/05/2022   HGBA1C 5.9 08/05/2021    Lab Results  Component Value Date   CREATININE 0.61 08/13/2022   CREATININE 0.64 02/05/2022   CREATININE 0.68 08/05/2021    Lab Results  Component Value Date   WBC 6.2 08/10/2019   HGB 14.2 08/10/2019   HCT 42.1 08/10/2019   PLT 320.0 08/10/2019   GLUCOSE 97 08/13/2022   CHOL 187 08/13/2022   TRIG 138.0 08/13/2022   HDL 52.00 08/13/2022   LDLDIRECT 109.0 08/13/2022   LDLCALC 108 (H) 08/13/2022   ALT 15 08/13/2022   AST 14 08/13/2022   NA 141 08/13/2022   K 4.3 08/13/2022   CL 102 08/13/2022   CREATININE 0.61 08/13/2022   BUN 16 08/13/2022   CO2 33 (H) 08/13/2022   TSH 2.24 08/13/2022   HGBA1C 6.1 08/13/2022   MICROALBUR 0.7 08/13/2022    DG Bone Density  Result Date: 11/24/2022 EXAM: DUAL X-RAY ABSORPTIOMETRY (DXA) FOR BONE MINERAL DENSITY IMPRESSION: Your patient Tiffany Chang completed a BMD test on 11/24/2022 using the [employer] iDXA DXA System (software version: 14.10) manufactured by [employer]. The following summarizes the results of our evaluation. Technologist:VLM PATIENT BIOGRAPHICAL: Name: Tiffany Chang, Tiffany Chang Patient ID:  [id number] Birth Date: [birth year] Height:     67.0 in. Gender:      Female Exam Date:  11/24/2022 Weight:     164.0 lbs. Indications: History of Breast Cancer, Family Hx of Osteoporosis, Height Loss, Postmenopausal, Hypothyroid, Caucasian, Advanced Age Fractures: Treatments: Calcium, Femara, Synthroid, Vit D DENSITOMETRY RESULTS: Site         Region     Measured Date Measured Age WHO  Classification Young Adult T-score BMD         %Change vs. Previous Significant Change (*) AP Spine L1-L3 11/24/2022 74.8 Normal -0.7 1.101 g/cm2 2.4% - AP Spine L1-L3 11/20/2021 73.8 Normal -0.9 1.075 g/cm2 6.9% Yes AP Spine L1-L3 11/12/2020 72.7 Osteopenia -1.4 1.006 g/cm2 -0.6% - AP Spine L1-L3 11/02/2019 71.7 Osteopenia -1.4 1.012 g/cm2 -2.3% - AP  Spine L1-L3 11/01/2018 70.7 Osteopenia -1.2 1.036 g/cm2 -0.5% - AP Spine L1-L3 10/26/2017 69.7 Osteopenia -1.1 1.041 g/cm2 0.3% - AP Spine L1-L3 10/23/2016 68.7 Osteopenia -1.2 1.038 g/cm2 -5.6% Yes AP Spine L1-L3 10/23/2015 67.7 Normal -0.7 1.099 g/cm2 -0.2% - AP Spine L1-L3 10/31/2014 66.7 Normal -0.7 1.101 g/cm2 -0.5% - AP Spine L1-L3 08/31/2013 65.5 Normal -0.6 1.107 g/cm2 0.2% - AP Spine L1-L3 02/27/2010 62.0 Normal -0.6 1.105 g/cm2 - - DualFemur Neck Right 11/24/2022 74.8 Normal -0.3 0.998 g/cm2 21.3% Yes DualFemur Neck Right 11/20/2021 73.8 Osteopenia -1.5 0.823 g/cm2 2.2% - DualFemur Neck Right 11/12/2020 72.7 Osteopenia -1.7 0.805 g/cm2 -1.3% - DualFemur Neck Right 11/02/2019 71.7 Osteopenia -1.6 0.816 g/cm2 6.7% - DualFemur Neck Right 11/01/2018 70.7 Osteopenia -2.0 0.765 g/cm2 -0.5% - DualFemur Neck Right 10/26/2017 69.7 Osteopenia -1.9 0.769 g/cm2 -0.6% - DualFemur Neck Right 10/23/2016 68.7 Osteopenia -1.9 0.774 g/cm2 -4.1% - DualFemur Neck Right 10/23/2015 67.7 Osteopenia -1.7 0.807 g/cm2 0.2% - DualFemur Neck Right 10/31/2014 66.7 Osteopenia -1.7 0.805 g/cm2 1.5% - DualFemur Neck Right 02/27/2010 62.0 Osteopenia -1.8 0.793 g/cm2 - - DualFemur Total Mean 11/24/2022 74.8 Normal -0.9 0.888 g/cm2 4.5% Yes DualFemur Total Mean 11/20/2021 73.8 Osteopenia -1.3 0.850 g/cm2 0.1% - DualFemur Total Mean 11/12/2020 72.7 Osteopenia -1.3 0.849 g/cm2 -0.5% - DualFemur Total Mean 11/02/2019 71.7 Osteopenia -1.2 0.853 g/cm2 0.8% - DualFemur Total Mean 11/01/2018 70.7 Osteopenia -1.3 0.846 g/cm2 -0.5% - DualFemur Total Mean 10/26/2017 69.7 Osteopenia -1.3 0.850 g/cm2  -1.5% - DualFemur Total Mean 10/23/2016 68.7 Osteopenia -1.1 0.863 g/cm2 -2.6% Yes DualFemur Total Mean 10/23/2015 67.7 Normal -1.0 0.886 g/cm2 -0.1% - DualFemur Total Mean 10/31/2014 66.7 Normal -1.0 0.887 g/cm2 0.6% - DualFemur Total Mean 02/27/2010 62.0 Normal -1.0 0.882 g/cm2 - - Left Forearm Radius 33% 11/24/2022 74.8 Osteoporosis -3.5 0.573 g/cm2 0.9% - Left Forearm Radius 33% 11/20/2021 73.8 Osteoporosis -3.5 0.568 g/cm2 -4.1% - Left Forearm Radius 33% 10/26/2017 69.7 Osteoporosis -3.2 0.592 g/cm2 -6.9% Yes Left Forearm Radius 33% 10/23/2016 68.7 Osteoporosis -2.7 0.636 g/cm2 -2.6% - Left Forearm Radius 33% 10/31/2014 66.7 Osteoporosis -2.5 0.653 g/cm2 -3.1% - Left Forearm Radius 33% 08/31/2013 65.5 Osteopenia -2.3 0.674 g/cm2 -5.3% Yes Left Forearm Radius 33% 02/27/2010 62.0 Osteopenia -1.9 0.712 g/cm2 - - ASSESSMENT: The BMD measured at Forearm Radius 33% is 0.573 g/cm2 with a T-score of -3.5. This patient is considered osteoporotic according to World Health Organization [facility]) criteria. L-4 was excluded due to degenerative changes. Compared with prior study, there has been no significant change in the spine. Compared with prior study, there has been significant increase in the total hip. The scan quality is good. World Science writer [facility]) criteria for post-menopausal, Caucasian Women: Normal:                   T-score at or above -1 SD Osteopenia/low bone mass: T-score between -1 and -2.5 SD Osteoporosis:             T-score at or below -2.5 SD RECOMMENDATIONS: 1. All patients should optimize calcium and vitamin D intake. 2. Consider FDA-approved medical therapies in postmenopausal women and men aged 43 years and older, based on the following: a. A hip or vertebral(clinical or morphometric) fracture b. T-score < -2.5 at the femoral neck or spine after appropriate evaluation to exclude secondary causes c. Low bone mass (T-score between -1.0 and -2.5 at the femoral neck or spine) and a 10-year  probability of a hip fracture > 3% or a 10-year probability of a major osteoporosis-related fracture >  20% based on the [place]-adapted WHO algorithm 3. Clinician judgment and/or patient preferences may indicate treatment for people with 10-year fracture probabilities above or below these levels FOLLOW-UP: People with diagnosed cases of osteoporosis or at high risk for fracture should have regular bone mineral density tests. For patients eligible for Medicare, routine testing is allowed once every 2 years. The testing frequency can be increased to one year for patients who have rapidly progressing disease, those who are receiving or discontinuing medical therapy to restore bone mass, or have additional risk factors. I have reviewed this report, and agree with the above findings. [facility] Electronically Signed   By: Frederico Hamman M.D.   On: 11/24/2022 11:59    Assessment & Plan:  .Family history of abdominal aortic aneurysm Assessment & Plan: She was screened in 2015 for AA ; none found. There are no recommendations to repeat screening in women with a FH of AA; however she requested repeat screening which was negative in May 2024    Acquired hypothyroidism -     Levothyroxine Sodium; Take 1 tablet (88 mcg total) by mouth daily.  Dispense: 90 tablet; Refill: 1  History of breast cancer in adulthood Assessment & Plan: Stopped letrozole in December.  Simonne Maffucci in June. She is undecided about pursuing genetic testing :  her MDM and one sister had BRCA .  She has one daughter    Elevated blood pressure reading without diagnosis of hypertension Assessment & Plan: Home readings have been quite labile and range from normal (<120 systolic) to 170 systolic.  Advised to suspend NSAIDS for one week (use of tylenol 1000 mg every 8 hours advised) and check BP daily and submit       I provided 30 minutes of face-to-face time during this encounter reviewing patient's last visit with  me, patient's  most recent visit with oncology, cardiology,  and orthopedics,  previoussurgical and non surgical procedures, previous  labs and imaging studies, counseling on currently addressed issues,  and post visit ordering to diagnostics and therapeutics .   Follow-up: No follow-ups on file.   Sherlene Shams, MD

## 2022-11-24 NOTE — Assessment & Plan Note (Addendum)
She was screened in 2015 for AA ; none found. There are no recommendations to repeat screening in women with a FH of AA; however she requested repeat screening which was negative in May 2024

## 2022-11-25 ENCOUNTER — Other Ambulatory Visit: Payer: Self-pay

## 2022-11-25 ENCOUNTER — Encounter: Payer: Self-pay | Admitting: Neurology

## 2022-11-25 DIAGNOSIS — G250 Essential tremor: Secondary | ICD-10-CM

## 2022-11-25 MED ORDER — PROPRANOLOL HCL 10 MG PO TABS
10.0000 mg | ORAL_TABLET | Freq: Two times a day (BID) | ORAL | 0 refills | Status: AC
Start: 2022-11-25 — End: ?

## 2022-12-02 ENCOUNTER — Inpatient Hospital Stay: Payer: Medicare Other | Attending: Oncology | Admitting: Oncology

## 2022-12-02 DIAGNOSIS — Z170 Estrogen receptor positive status [ER+]: Secondary | ICD-10-CM | POA: Diagnosis not present

## 2022-12-02 DIAGNOSIS — M810 Age-related osteoporosis without current pathological fracture: Secondary | ICD-10-CM | POA: Diagnosis not present

## 2022-12-02 DIAGNOSIS — C50411 Malignant neoplasm of upper-outer quadrant of right female breast: Secondary | ICD-10-CM | POA: Diagnosis not present

## 2022-12-02 DIAGNOSIS — Z1231 Encounter for screening mammogram for malignant neoplasm of breast: Secondary | ICD-10-CM

## 2022-12-02 NOTE — Progress Notes (Unsigned)
[facility]  [phone]) [phone] [phone]  ID: Tiffany Chang OB: [birth year]  MR#: [id number]  OAC#:[id number]  Patient Care Team: Sherlene Shams, MD as PCP - General (Internal Medicine) Jeralyn Ruths, MD as Consulting Physician (Oncology)  I connected with [name] on 12/03/22 at  3:30 PM EDT by video enabled telemedicine visit and verified that I am speaking with the correct person using two identifiers.   I discussed the limitations, risks, security and privacy concerns of performing an evaluation and management service by telemedicine and the availability of in-person appointments. I also discussed with the patient that there may be a patient responsible charge related to this service. The patient expressed understanding and agreed to proceed.   Other persons participating in the visit and their role in the encounter: Patient, MD.  Patient's location: Home. Provider's location: Clinic.   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  INTERVAL HISTORY: Patient agreed to video assisted telemedicine visit for routine yearly evaluation and discussion of her mammogram and bone mineral density results.  Her essential tremor has significantly improved after undergoing intracranial treatments at [facility].  She currently feels well and is asymptomatic.  She has no new neurologic complaints.  She denies any recent fevers or illnesses.  She has a good appetite and denies weight loss.  She denies any chest pain, shortness of breath, cough, or hemoptysis.  She denies any nausea, vomiting, constipation, or diarrhea. She has no urinary complaints.  Patient offers no specific complaints today.  REVIEW OF SYSTEMS:   Review of Systems  Constitutional: Negative.  Negative for fever, malaise/fatigue and weight loss.  Respiratory: Negative.  Negative for cough and shortness of  breath.   Cardiovascular: Negative.  Negative for chest pain and leg swelling.  Gastrointestinal: Negative.  Negative for abdominal pain.  Genitourinary: Negative.  Negative for dysuria.  Musculoskeletal: Negative.  Negative for back pain.  Skin: Negative.  Negative for rash.  Neurological:  Positive for tremors. Negative for sensory change, focal weakness, weakness and headaches.  Psychiatric/Behavioral: Negative.  The patient is not nervous/anxious.     As per HPI. Otherwise, a complete review of systems is negative.  PAST MEDICAL HISTORY: Past Medical History:  Diagnosis Date   Breast cancer (HCC) 2016   ILC - RT MASTECTOMY   Hashimoto's thyroiditis    History of arthroscopy of left knee 1985   Hyperlipidemia    mild, with prior statin intolerance   Hypothyroid    Osteoporosis    prior Evista use x 5 yrs , 2007   Spinal stenosis    mild   Syncope, vasovagal    3 episodes   Tremor    Viral meningitis    77 years old    PAST SURGICAL HISTORY: Past Surgical History:  Procedure Laterality Date   CATARACT EXTRACTION W/PHACO Left 09/30/2017   Procedure: CATARACT EXTRACTION PHACO AND INTRAOCULAR LENS PLACEMENT (IOC);  Surgeon: Galen Manila, MD;  Location: [facility];  Service: Ophthalmology;  Laterality: Left;  [place] 00:40 AP% 15.7 CDE 6.38 Fluid pack lot # [id number]   CATARACT EXTRACTION W/PHACO Right 06/07/2019   Procedure: CATARACT EXTRACTION PHACO AND INTRAOCULAR LENS PLACEMENT (IOC) RIGHT 4.62 00:33.5;  Surgeon: Galen Manila, MD;  Location: [facility];  Service: Ophthalmology;  Laterality: Right;   CESAREAN SECTION     CHOLECYSTECTOMY  2006   COLONOSCOPY WITH PROPOFOL N/A 09/26/2019   Procedure:  COLONOSCOPY WITH BIOPSY;  Surgeon: Midge Minium, MD;  Location: [facility];  Service: Endoscopy;  Laterality: N/A;  priority 3   FOOT SURGERY     plantar fascitis, Dr. Orland Jarred   KNEE ARTHROSCOPY  1985   right   MASTECTOMY Right 2016   IL[facility]   POLYPECTOMY  N/A 09/26/2019   Procedure: POLYPECTOMY;  Surgeon: Midge Minium, MD;  Location: [facility];  Service: Endoscopy;  Laterality: N/A;   SENTINEL NODE BIOPSY Right 10/10/2014   Procedure: SENTINEL NODE BIOPSY;  Surgeon: Nadeen Landau, MD;  Location: [facility];  Service: General;  Laterality: Right;   SIMPLE MASTECTOMY WITH AXILLARY SENTINEL NODE BIOPSY Right 10/10/2014   Procedure: SIMPLE MASTECTOMY;  Surgeon: Nadeen Landau, MD;  Location: [facility];  Service: General;  Laterality: Right;   TONSILLECTOMY      FAMILY HISTORY Family History  Problem Relation Age of Onset   Osteoporosis Mother    Aortic aneurysm Father    Cancer Sister 27       Breast   Breast cancer Sister 70   Cancer Maternal Grandfather        breast   Breast cancer Maternal Grandmother 58       ADVANCED DIRECTIVES:    HEALTH MAINTENANCE: Social History   Tobacco Use   Smoking status: Never   Smokeless tobacco: Never  Vaping Use   Vaping status: Never Used  Substance Use Topics   Alcohol use: Yes    Alcohol/week: 3.0 standard drinks of alcohol    Types: 3 Standard drinks or equivalent per week    Comment: social, 2 times per week   Drug use: No     Colonoscopy:  PAP:  Bone density:  Lipid panel:  Allergies  Allergen Reactions   Statins Other (See Comments)     Leg cramps in high doses   Bee Venom Hives    Current Outpatient Medications  Medication Sig Dispense Refill   alendronate (FOSAMAX) 70 MG tablet TAKE 1 TABLET (70 MG TOTAL) BY MOUTH ONCE A WEEK. TAKE WITH A FULL GLASS OF WATER ON AN EMPTY STOMACH. 12 tablet 3   Cholecalciferol (VITAMIN D3) 50 MCG (2000 UT) TABS Take 1 tablet by mouth daily.      levothyroxine (SYNTHROID) 88 MCG tablet Take 1 tablet (88 mcg total) by mouth daily. 90 tablet 1   naproxen sodium (ALEVE) 220 MG tablet Take 220 mg by mouth 2 (two) times daily as needed (pain).     primidone (MYSOLINE) 250 MG tablet TAKE 1/2 TABLET BY MOUTH EVERY MORNING AND AT  BEDTIME 90 tablet 0   propranolol (INDERAL) 10 MG tablet Take 1 tablet (10 mg total) by mouth 2 (two) times daily. 180 tablet 0   triamcinolone cream (KENALOG) 0.5 % Apply topically.     No current facility-administered medications for this visit.    OBJECTIVE: There were no vitals filed for this visit.    There is no height or weight on file to calculate BMI.    ECOG FS:0 - Asymptomatic  General: Well-developed, well-nourished, no acute distress. HEENT: Normocephalic. Neuro: Alert, answering all questions appropriately. Cranial nerves grossly intact. Psych: Normal affect.  LAB RESULTS:  Lab Results  Component Value Date   NA 141 08/13/2022   K 4.3 08/13/2022   CL 102 08/13/2022   CO2 33 (H) 08/13/2022   GLUCOSE 97 08/13/2022   BUN 16 08/13/2022   CREATININE 0.61 08/13/2022   CALCIUM 9.3 08/13/2022   PROT 7.1  08/13/2022   ALBUMIN 4.5 08/13/2022   AST 14 08/13/2022   ALT 15 08/13/2022   ALKPHOS 70 08/13/2022   BILITOT 0.4 08/13/2022   GFRNONAA >60 10/03/2014   GFRAA >60 10/03/2014    Lab Results  Component Value Date   WBC 6.2 08/10/2019   NEUTROABS 4.1 08/10/2019   HGB 14.2 08/10/2019   HCT 42.1 08/10/2019   MCV 88.8 08/10/2019   PLT 320.0 08/10/2019     STUDIES: MM 3D SCREEN BREAST UNI LEFT  Result Date: 11/25/2022 CLINICAL DATA:  Screening. EXAM: DIGITAL SCREENING UNILATERAL LEFT MAMMOGRAM WITH CAD AND TOMOSYNTHESIS TECHNIQUE: Left screening digital craniocaudal and mediolateral oblique mammograms were obtained. Left screening digital breast tomosynthesis was performed. The images were evaluated with computer-aided detection. COMPARISON:  Previous exam(s). ACR Breast Density Category c: The breasts are heterogeneously dense, which may obscure small masses. FINDINGS: The patient has had a right mastectomy. There are no findings suspicious for malignancy. IMPRESSION: No mammographic evidence of malignancy. A result letter of this screening mammogram will be mailed  directly to the patient. RECOMMENDATION: Screening mammogram in one year.  (Code:SM-L-77M) BI-RADS CATEGORY  1: Negative. Electronically Signed   By: Sande Brothers M.D.   On: 11/25/2022 11:22   DG Bone Density  Result Date: 11/24/2022 EXAM: DUAL X-RAY ABSORPTIOMETRY (DXA) FOR BONE MINERAL DENSITY IMPRESSION: Your patient Anistyn Graddy completed a BMD test on 11/24/2022 using the [employer] iDXA DXA System (software version: 14.10) manufactured by [employer]. The following summarizes the results of our evaluation. Technologist:VLM PATIENT BIOGRAPHICAL: Name: Phylis, Javed Patient ID:  [id number] Birth Date: [birth year] Height:     67.0 in. Gender:      Female Exam Date:  11/24/2022 Weight:     164.0 lbs. Indications: History of Breast Cancer, Family Hx of Osteoporosis, Height Loss, Postmenopausal, Hypothyroid, Caucasian, Advanced Age Fractures: Treatments: Calcium, Femara, Synthroid, Vit D DENSITOMETRY RESULTS: Site         Region     Measured Date Measured Age WHO Classification Young Adult T-score BMD         %Change vs. Previous Significant Change (*) AP Spine L1-L3 11/24/2022 74.8 Normal -0.7 1.101 g/cm2 2.4% - AP Spine L1-L3 11/20/2021 73.8 Normal -0.9 1.075 g/cm2 6.9% Yes AP Spine L1-L3 11/12/2020 72.7 Osteopenia -1.4 1.006 g/cm2 -0.6% - AP Spine L1-L3 11/02/2019 71.7 Osteopenia -1.4 1.012 g/cm2 -2.3% - AP Spine L1-L3 11/01/2018 70.7 Osteopenia -1.2 1.036 g/cm2 -0.5% - AP Spine L1-L3 10/26/2017 69.7 Osteopenia -1.1 1.041 g/cm2 0.3% - AP Spine L1-L3 10/23/2016 68.7 Osteopenia -1.2 1.038 g/cm2 -5.6% Yes AP Spine L1-L3 10/23/2015 67.7 Normal -0.7 1.099 g/cm2 -0.2% - AP Spine L1-L3 10/31/2014 66.7 Normal -0.7 1.101 g/cm2 -0.5% - AP Spine L1-L3 08/31/2013 65.5 Normal -0.6 1.107 g/cm2 0.2% - AP Spine L1-L3 02/27/2010 62.0 Normal -0.6 1.105 g/cm2 - - DualFemur Neck Right 11/24/2022 74.8 Normal -0.3 0.998 g/cm2 21.3% Yes DualFemur Neck Right 11/20/2021 73.8 Osteopenia -1.5 0.823 g/cm2 2.2% - DualFemur Neck Right  11/12/2020 72.7 Osteopenia -1.7 0.805 g/cm2 -1.3% - DualFemur Neck Right 11/02/2019 71.7 Osteopenia -1.6 0.816 g/cm2 6.7% - DualFemur Neck Right 11/01/2018 70.7 Osteopenia -2.0 0.765 g/cm2 -0.5% - DualFemur Neck Right 10/26/2017 69.7 Osteopenia -1.9 0.769 g/cm2 -0.6% - DualFemur Neck Right 10/23/2016 68.7 Osteopenia -1.9 0.774 g/cm2 -4.1% - DualFemur Neck Right 10/23/2015 67.7 Osteopenia -1.7 0.807 g/cm2 0.2% - DualFemur Neck Right 10/31/2014 66.7 Osteopenia -1.7 0.805 g/cm2 1.5% - DualFemur Neck Right 02/27/2010 62.0 Osteopenia -1.8 0.793 g/cm2 - - DualFemur Total Mean  11/24/2022 74.8 Normal -0.9 0.888 g/cm2 4.5% Yes DualFemur Total Mean 11/20/2021 73.8 Osteopenia -1.3 0.850 g/cm2 0.1% - DualFemur Total Mean 11/12/2020 72.7 Osteopenia -1.3 0.849 g/cm2 -0.5% - DualFemur Total Mean 11/02/2019 71.7 Osteopenia -1.2 0.853 g/cm2 0.8% - DualFemur Total Mean 11/01/2018 70.7 Osteopenia -1.3 0.846 g/cm2 -0.5% - DualFemur Total Mean 10/26/2017 69.7 Osteopenia -1.3 0.850 g/cm2 -1.5% - DualFemur Total Mean 10/23/2016 68.7 Osteopenia -1.1 0.863 g/cm2 -2.6% Yes DualFemur Total Mean 10/23/2015 67.7 Normal -1.0 0.886 g/cm2 -0.1% - DualFemur Total Mean 10/31/2014 66.7 Normal -1.0 0.887 g/cm2 0.6% - DualFemur Total Mean 02/27/2010 62.0 Normal -1.0 0.882 g/cm2 - - Left Forearm Radius 33% 11/24/2022 74.8 Osteoporosis -3.5 0.573 g/cm2 0.9% - Left Forearm Radius 33% 11/20/2021 73.8 Osteoporosis -3.5 0.568 g/cm2 -4.1% - Left Forearm Radius 33% 10/26/2017 69.7 Osteoporosis -3.2 0.592 g/cm2 -6.9% Yes Left Forearm Radius 33% 10/23/2016 68.7 Osteoporosis -2.7 0.636 g/cm2 -2.6% - Left Forearm Radius 33% 10/31/2014 66.7 Osteoporosis -2.5 0.653 g/cm2 -3.1% - Left Forearm Radius 33% 08/31/2013 65.5 Osteopenia -2.3 0.674 g/cm2 -5.3% Yes Left Forearm Radius 33% 02/27/2010 62.0 Osteopenia -1.9 0.712 g/cm2 - - ASSESSMENT: The BMD measured at Forearm Radius 33% is 0.573 g/cm2 with a T-score of -3.5. This patient is considered osteoporotic according to  World Health Organization [facility]) criteria. L-4 was excluded due to degenerative changes. Compared with prior study, there has been no significant change in the spine. Compared with prior study, there has been significant increase in the total hip. The scan quality is good. World Science writer [facility]) criteria for post-menopausal, Caucasian Women: Normal:                   T-score at or above -1 SD Osteopenia/low bone mass: T-score between -1 and -2.5 SD Osteoporosis:             T-score at or below -2.5 SD RECOMMENDATIONS: 1. All patients should optimize calcium and vitamin D intake. 2. Consider FDA-approved medical therapies in postmenopausal women and men aged 20 years and older, based on the following: a. A hip or vertebral(clinical or morphometric) fracture b. T-score < -2.5 at the femoral neck or spine after appropriate evaluation to exclude secondary causes c. Low bone mass (T-score between -1.0 and -2.5 at the femoral neck or spine) and a 10-year probability of a hip fracture > 3% or a 10-year probability of a major osteoporosis-related fracture > 20% based on the [place]-adapted WHO algorithm 3. Clinician judgment and/or patient preferences may indicate treatment for people with 10-year fracture probabilities above or below these levels FOLLOW-UP: People with diagnosed cases of osteoporosis or at high risk for fracture should have regular bone mineral density tests. For patients eligible for Medicare, routine testing is allowed once every 2 years. The testing frequency can be increased to one year for patients who have rapidly progressing disease, those who are receiving or discontinuing medical therapy to restore bone mass, or have additional risk factors. I have reviewed this report, and agree with the above findings. [facility] Electronically Signed   By: Frederico Hamman M.D.   On: 11/24/2022 11:59    ASSESSMENT: Stage Ia ER/PR positive HER-2 negative invasive lobular adenocarcinoma of  the upper outer quadrant of the right breast.  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 Given the fact that her  malignancy was lobular in nature, she did not require chemotherapy.  Patient completed 5 years of letrozole in approximately June 2023 and discontinued treatment.  Her most recent left breast screening mammogram on November 24, 2022 was reported as BI-RADS 1.  Repeat in July 2025.  Will continue to monitor patient once per year until she is 10 years removed from her surgery in May 2026.   Osteoporosis: Unchanged.  Patient's most recent bone mineral density on November 24, 2022 reported T-score of -3.5 which is unchanged from 1 year prior.  Continue calcium, Fosamax, and vitamin D.  Can consider switching to Prolia or Zometa in 1 year if no improvement of T-score.  Repeat imaging in July 2025.  Follow-up as above.   Essential tremor: Significantly improved with intracranial procedure completed at [facility].  I provided 20 minutes of face-to-face video visit time during this encounter which included chart review, counseling, and coordination of care as documented above.   Patient expressed understanding and was in agreement with this plan. She also understands that She can call clinic at any time with any questions, concerns, or complaints.    Jeralyn Ruths, MD   12/03/2022 10:22 AM

## 2022-12-08 ENCOUNTER — Encounter: Payer: Self-pay | Admitting: Internal Medicine

## 2022-12-08 DIAGNOSIS — I10 Essential (primary) hypertension: Secondary | ICD-10-CM

## 2022-12-09 MED ORDER — AMLODIPINE BESYLATE 2.5 MG PO TABS
2.5000 mg | ORAL_TABLET | Freq: Every day | ORAL | 1 refills | Status: DC
Start: 2022-12-09 — End: 2023-05-01

## 2022-12-09 NOTE — Assessment & Plan Note (Signed)
Advised to start amlodiine 2.5 mg daily for home readings which have been elevated to 150 systolic.  May be due In part to use of Aleve,  but she has continued using it due to hip pain

## 2023-01-01 ENCOUNTER — Encounter (INDEPENDENT_AMBULATORY_CARE_PROVIDER_SITE_OTHER): Payer: Self-pay

## 2023-01-23 ENCOUNTER — Telehealth: Payer: Self-pay | Admitting: Internal Medicine

## 2023-01-23 NOTE — Telephone Encounter (Signed)
Copied from CRM [id number]. Topic: Medicare AWV >> Jan 23, 2023  2:27 PM Payton Doughty wrote: Reason for CRM: LM 01/23/2023 to schedule AWV   Verlee Rossetti; Care Guide Ambulatory Clinical Support [place] [facility] Direct Dial: [phone]

## 2023-02-11 ENCOUNTER — Encounter: Payer: Self-pay | Admitting: Internal Medicine

## 2023-02-11 ENCOUNTER — Ambulatory Visit: Payer: Medicare Other | Admitting: Internal Medicine

## 2023-02-11 VITALS — BP 126/72 | HR 77 | Temp 98.6°F | Ht 67.0 in | Wt 166.0 lb

## 2023-02-11 DIAGNOSIS — E559 Vitamin D deficiency, unspecified: Secondary | ICD-10-CM

## 2023-02-11 DIAGNOSIS — R7303 Prediabetes: Secondary | ICD-10-CM | POA: Diagnosis not present

## 2023-02-11 DIAGNOSIS — E785 Hyperlipidemia, unspecified: Secondary | ICD-10-CM | POA: Diagnosis not present

## 2023-02-11 DIAGNOSIS — Z23 Encounter for immunization: Secondary | ICD-10-CM | POA: Diagnosis not present

## 2023-02-11 DIAGNOSIS — E039 Hypothyroidism, unspecified: Secondary | ICD-10-CM

## 2023-02-11 DIAGNOSIS — G250 Essential tremor: Secondary | ICD-10-CM

## 2023-02-11 DIAGNOSIS — I10 Essential (primary) hypertension: Secondary | ICD-10-CM | POA: Diagnosis not present

## 2023-02-11 DIAGNOSIS — M1611 Unilateral primary osteoarthritis, right hip: Secondary | ICD-10-CM

## 2023-02-11 DIAGNOSIS — M48061 Spinal stenosis, lumbar region without neurogenic claudication: Secondary | ICD-10-CM

## 2023-02-11 LAB — COMPREHENSIVE METABOLIC PANEL
ALT: 11 U/L (ref 0–35)
AST: 13 U/L (ref 0–37)
Albumin: 4.2 g/dL (ref 3.5–5.2)
Alkaline Phosphatase: 68 U/L (ref 39–117)
BUN: 17 mg/dL (ref 6–23)
CO2: 30 mEq/L (ref 19–32)
Calcium: 9 mg/dL (ref 8.4–10.5)
Chloride: 102 mEq/L (ref 96–112)
Creatinine, Ser: 0.63 mg/dL (ref 0.40–1.20)
GFR: 87.02 mL/min (ref >60.00)
Glucose, Bld: 95 mg/dL (ref 70–99)
Potassium: 3.8 mEq/L (ref 3.5–5.1)
Sodium: 140 mEq/L (ref 135–145)
Total Bilirubin: 0.4 mg/dL (ref 0.2–1.2)
Total Protein: 7.1 g/dL (ref 6.0–8.3)

## 2023-02-11 LAB — CBC WITH DIFFERENTIAL/PLATELET
Basophils Absolute: 0 10*3/uL (ref 0.0–0.1)
Basophils Relative: 0.7 % (ref 0.0–3.0)
Eosinophils Absolute: 0.1 10*3/uL (ref 0.0–0.7)
Eosinophils Relative: 2.1 % (ref 0.0–5.0)
HCT: 44.3 % (ref 36.0–46.0)
Hemoglobin: 14.5 g/dL (ref 12.0–15.0)
Lymphocytes Relative: 19.3 % (ref 12.0–46.0)
Lymphs Abs: 1.4 10*3/uL (ref 0.7–4.0)
MCHC: 32.7 g/dL (ref 30.0–36.0)
MCV: 90.2 fl (ref 78.0–100.0)
Monocytes Absolute: 0.4 10*3/uL (ref 0.1–1.0)
Monocytes Relative: 5 % (ref 3.0–12.0)
Neutro Abs: 5.1 10*3/uL (ref 1.4–7.7)
Neutrophils Relative %: 72.9 % (ref 43.0–77.0)
Platelets: 342 10*3/uL (ref 150.0–400.0)
RBC: 4.91 Mil/uL (ref 3.87–5.11)
RDW: 13.9 % (ref 11.5–15.5)
WBC: 7 10*3/uL (ref 4.0–10.5)

## 2023-02-11 LAB — MICROALBUMIN / CREATININE URINE RATIO
Creatinine,U: 43.7 mg/dL
Microalb Creat Ratio: 1.6 mg/g (ref 0.0–30.0)
Microalb, Ur: <0.7 mg/dL (ref 0.0–1.9)

## 2023-02-11 LAB — TSH: TSH: 2.93 u[IU]/mL (ref 0.35–5.50)

## 2023-02-11 LAB — LIPID PANEL
Cholesterol: 292 mg/dL — ABNORMAL HIGH (ref 0–200)
HDL: 50.7 mg/dL (ref >39.00)
LDL Cholesterol: 208 mg/dL — ABNORMAL HIGH (ref 0–99)
NonHDL: 241.33
Total CHOL/HDL Ratio: 6
Triglycerides: 167 mg/dL — ABNORMAL HIGH (ref 0.0–149.0)
VLDL: 33.4 mg/dL (ref 0.0–40.0)

## 2023-02-11 LAB — VITAMIN D 25 HYDROXY (VIT D DEFICIENCY, FRACTURES): VITD: 26.89 ng/mL — ABNORMAL LOW (ref 30.00–100.00)

## 2023-02-11 LAB — HEMOGLOBIN A1C: Hgb A1c MFr Bld: 6 % (ref 4.6–6.5)

## 2023-02-11 MED ORDER — TRAMADOL HCL 50 MG PO TABS: 50.0000 mg | ORAL_TABLET | Freq: Four times a day (QID) | ORAL | 0 refills | Status: AC | PRN

## 2023-02-11 NOTE — Assessment & Plan Note (Signed)
Right hand  (dominant) tremor has resolved s/p left FUS thalatomy.  Post procedure adverse effect on balance now resolved.  Still taking propranolol 10 mg  bid and  primidone for left hand tremor; sees Lurena Joiner Tat for follow up

## 2023-02-11 NOTE — Assessment & Plan Note (Addendum)
Statin suspended  for several months  per patient preference .  Will resume now for LDL >200  Lab Results  Component Value Date   CHOL 292 (H) 02/11/2023   HDL 50.70 02/11/2023   LDLCALC 208 (H) 02/11/2023   LDLDIRECT 109.0 08/13/2022   TRIG 167.0 (H) 02/11/2023   CHOLHDL 6 02/11/2023

## 2023-02-11 NOTE — Assessment & Plan Note (Signed)
Managed with yoga and strength training ?

## 2023-02-11 NOTE — Assessment & Plan Note (Signed)
Her discomfort is limiting her activity and has not improved with PT , only worsened.  Tylenol and tramadol prescribed while suspending celebrex

## 2023-02-11 NOTE — Patient Instructions (Addendum)
I recommend suspending celebrex and naprosyn for a few weeks to see if the BP normalizes    You can use tylenol 1000 mg twice daily and I will send tramadol for prn use.     You can take your levothyroxine THE NIGHT BEFORE YOUR FOSAMAX

## 2023-02-11 NOTE — Progress Notes (Signed)
Subjective:  Patient ID: Laquandria Gannaway, female    DOB: 1946/05/15  Age: 77 y.o. MRN: [id number]  CC: The primary encounter diagnosis was Essential hypertension. Diagnoses of Acquired hypothyroidism, Hyperlipidemia, unspecified hyperlipidemia type, Prediabetes, Vitamin D deficiency, Need for influenza vaccination, Benign essential tremor, Spinal stenosis of lumbar region without neurogenic claudication, and Primary osteoarthritis of right hip were also pertinent to this visit.   HPI Lin Ahumada presents for  Chief Complaint  Patient presents with   Medical Management of Chronic Issues    6 month follow up    1) Hypertension: patient checks blood pressure twice weekly at home.  Readings have been labile   . Patient is following a reduced salt diet most days and is taking medications as prescribed (amlodipine 2.5 mg ,  and propranolol for tremor)= but has been taking celebrex since July 30 ,  prescribed by Orthopedics for management of right hip pain  .  BP at Ortho office was 110/70 pre therapy   2) right hip OA: became symptomatic at start of summer  symptoms aggravated by walking,  swlimming.    seen by ortho in [place] .  Films noted moderate OA with spurring.    Given options of management:  PT .  C/s injections ,  or surgery .  She completed 6 weeks of PT and her pain is worse. She sees ortho next Tuesday.  Grandchild is due in January, (daughter Maralyn Sago moving back from [place] to [place])  and she has  travel plans after that   3) essential tremor:  still has a left hand tremor .  Had the thalamic procedure  at [facility] in November   taking primidone and propranolol   2) prediabetes:  she is following a low GI diet and exercising regularly   as tolerated by hip pain     Outpatient Medications Prior to Visit  Medication Sig Dispense Refill   alendronate (FOSAMAX) 70 MG tablet TAKE 1 TABLET (70 MG TOTAL) BY MOUTH ONCE A WEEK. TAKE WITH A FULL GLASS OF WATER ON AN EMPTY STOMACH. 12 tablet 3    amLODipine (NORVASC) 2.5 MG tablet Take 1 tablet (2.5 mg total) by mouth daily. 90 tablet 1   celecoxib (CELEBREX) 200 MG capsule Take 1 tablet by mouth daily.     Cholecalciferol (VITAMIN D3) 50 MCG (2000 UT) TABS Take 1 tablet by mouth daily.      levothyroxine (SYNTHROID) 88 MCG tablet Take 1 tablet (88 mcg total) by mouth daily. 90 tablet 1   naproxen sodium (ALEVE) 220 MG tablet Take 220 mg by mouth 2 (two) times daily as needed (pain).     primidone (MYSOLINE) 250 MG tablet TAKE 1/2 TABLET BY MOUTH EVERY MORNING AND AT BEDTIME 90 tablet 0   propranolol (INDERAL) 10 MG tablet Take 1 tablet (10 mg total) by mouth 2 (two) times daily. 180 tablet 0   triamcinolone cream (KENALOG) 0.5 % Apply topically.     No facility-administered medications prior to visit.    Review of Systems;  Patient denies headache, fevers, malaise, unintentional weight loss, skin rash, eye pain, sinus congestion and sinus pain, sore throat, dysphagia,  hemoptysis , cough, dyspnea, wheezing, chest pain, palpitations, orthopnea, edema, abdominal pain, nausea, melena, diarrhea, constipation, flank pain, dysuria, hematuria, urinary  Frequency, nocturia, numbness, tingling, seizures,  Focal weakness, Loss of consciousness,  Tremor, insomnia, depression, anxiety, and suicidal ideation.      Objective:  BP 126/72   Pulse 77  Subjective:  Patient ID: Laquandria Gannaway, female    DOB: 1946/05/15  Age: 77 y.o. MRN: [id number]  CC: The primary encounter diagnosis was Essential hypertension. Diagnoses of Acquired hypothyroidism, Hyperlipidemia, unspecified hyperlipidemia type, Prediabetes, Vitamin D deficiency, Need for influenza vaccination, Benign essential tremor, Spinal stenosis of lumbar region without neurogenic claudication, and Primary osteoarthritis of right hip were also pertinent to this visit.   HPI Lin Ahumada presents for  Chief Complaint  Patient presents with   Medical Management of Chronic Issues    6 month follow up    1) Hypertension: patient checks blood pressure twice weekly at home.  Readings have been labile   . Patient is following a reduced salt diet most days and is taking medications as prescribed (amlodipine 2.5 mg ,  and propranolol for tremor)= but has been taking celebrex since July 30 ,  prescribed by Orthopedics for management of right hip pain  .  BP at Ortho office was 110/70 pre therapy   2) right hip OA: became symptomatic at start of summer  symptoms aggravated by walking,  swlimming.    seen by ortho in [place] .  Films noted moderate OA with spurring.    Given options of management:  PT .  C/s injections ,  or surgery .  She completed 6 weeks of PT and her pain is worse. She sees ortho next Tuesday.  Grandchild is due in January, (daughter Maralyn Sago moving back from [place] to [place])  and she has  travel plans after that   3) essential tremor:  still has a left hand tremor .  Had the thalamic procedure  at [facility] in November   taking primidone and propranolol   2) prediabetes:  she is following a low GI diet and exercising regularly   as tolerated by hip pain     Outpatient Medications Prior to Visit  Medication Sig Dispense Refill   alendronate (FOSAMAX) 70 MG tablet TAKE 1 TABLET (70 MG TOTAL) BY MOUTH ONCE A WEEK. TAKE WITH A FULL GLASS OF WATER ON AN EMPTY STOMACH. 12 tablet 3    amLODipine (NORVASC) 2.5 MG tablet Take 1 tablet (2.5 mg total) by mouth daily. 90 tablet 1   celecoxib (CELEBREX) 200 MG capsule Take 1 tablet by mouth daily.     Cholecalciferol (VITAMIN D3) 50 MCG (2000 UT) TABS Take 1 tablet by mouth daily.      levothyroxine (SYNTHROID) 88 MCG tablet Take 1 tablet (88 mcg total) by mouth daily. 90 tablet 1   naproxen sodium (ALEVE) 220 MG tablet Take 220 mg by mouth 2 (two) times daily as needed (pain).     primidone (MYSOLINE) 250 MG tablet TAKE 1/2 TABLET BY MOUTH EVERY MORNING AND AT BEDTIME 90 tablet 0   propranolol (INDERAL) 10 MG tablet Take 1 tablet (10 mg total) by mouth 2 (two) times daily. 180 tablet 0   triamcinolone cream (KENALOG) 0.5 % Apply topically.     No facility-administered medications prior to visit.    Review of Systems;  Patient denies headache, fevers, malaise, unintentional weight loss, skin rash, eye pain, sinus congestion and sinus pain, sore throat, dysphagia,  hemoptysis , cough, dyspnea, wheezing, chest pain, palpitations, orthopnea, edema, abdominal pain, nausea, melena, diarrhea, constipation, flank pain, dysuria, hematuria, urinary  Frequency, nocturia, numbness, tingling, seizures,  Focal weakness, Loss of consciousness,  Tremor, insomnia, depression, anxiety, and suicidal ideation.      Objective:  BP 126/72   Pulse 77  Temp 98.6 F (37 C) (Oral)   Ht 5\' 7"  (1.702 m)   Wt 166 lb (75.3 kg)   SpO2 96%   BMI 26.00 kg/m   BP Readings from Last 3 Encounters:  02/11/23 126/72  11/24/22 136/74  10/21/22 124/72    Wt Readings from Last 3 Encounters:  02/11/23 166 lb (75.3 kg)  11/24/22 166 lb (75.3 kg)  10/21/22 165 lb 12.8 oz (75.2 kg)    Physical Exam  Lab Results  Component Value Date   HGBA1C 6.0 02/11/2023   HGBA1C 6.1 08/13/2022   HGBA1C 6.0 02/05/2022    Lab Results  Component Value Date   CREATININE 0.63 02/11/2023   CREATININE 0.61 08/13/2022   CREATININE 0.64 02/05/2022    Lab  Results  Component Value Date   WBC 7.0 02/11/2023   HGB 14.5 02/11/2023   HCT 44.3 02/11/2023   PLT 342.0 02/11/2023   GLUCOSE 95 02/11/2023   CHOL 292 (H) 02/11/2023   TRIG 167.0 (H) 02/11/2023   HDL 50.70 02/11/2023   LDLDIRECT 109.0 08/13/2022   LDLCALC 208 (H) 02/11/2023   ALT 11 02/11/2023   AST 13 02/11/2023   NA 140 02/11/2023   K 3.8 02/11/2023   CL 102 02/11/2023   CREATININE 0.63 02/11/2023   BUN 17 02/11/2023   CO2 30 02/11/2023   TSH 2.93 02/11/2023   HGBA1C 6.0 02/11/2023   MICROALBUR <0.7 02/11/2023    MM 3D SCREEN BREAST UNI LEFT  Result Date: 11/25/2022 CLINICAL DATA:  Screening. EXAM: DIGITAL SCREENING UNILATERAL LEFT MAMMOGRAM WITH CAD AND TOMOSYNTHESIS TECHNIQUE: Left screening digital craniocaudal and mediolateral oblique mammograms were obtained. Left screening digital breast tomosynthesis was performed. The images were evaluated with computer-aided detection. COMPARISON:  Previous exam(s). ACR Breast Density Category c: The breasts are heterogeneously dense, which may obscure small masses. FINDINGS: The patient has had a right mastectomy. There are no findings suspicious for malignancy. IMPRESSION: No mammographic evidence of malignancy. A result letter of this screening mammogram will be mailed directly to the patient. RECOMMENDATION: Screening mammogram in one year.  (Code:SM-L-18M) BI-RADS CATEGORY  1: Negative. Electronically Signed   By: Sande Brothers M.D.   On: 11/25/2022 11:22   DG Bone Density  Result Date: 11/24/2022 EXAM: DUAL X-RAY ABSORPTIOMETRY (DXA) FOR BONE MINERAL DENSITY IMPRESSION: Your patient Ena Frickey completed a BMD test on 11/24/2022 using the [employer] iDXA DXA System (software version: 14.10) manufactured by [employer]. The following summarizes the results of our evaluation. Technologist:VLM PATIENT BIOGRAPHICAL: Name: Neave, Shames Patient ID:  [id number] Birth Date: 1945/11/10 Height:     67.0 in. Gender:      Female Exam Date:   11/24/2022 Weight:     164.0 lbs. Indications: History of Breast Cancer, Family Hx of Osteoporosis, Height Loss, Postmenopausal, Hypothyroid, Caucasian, Advanced Age Fractures: Treatments: Calcium, Femara, Synthroid, Vit D DENSITOMETRY RESULTS: Site         Region     Measured Date Measured Age WHO Classification Young Adult T-score BMD         %Change vs. Previous Significant Change (*) AP Spine L1-L3 11/24/2022 74.8 Normal -0.7 1.101 g/cm2 2.4% - AP Spine L1-L3 11/20/2021 73.8 Normal -0.9 1.075 g/cm2 6.9% Yes AP Spine L1-L3 11/12/2020 72.7 Osteopenia -1.4 1.006 g/cm2 -0.6% - AP Spine L1-L3 11/02/2019 71.7 Osteopenia -1.4 1.012 g/cm2 -2.3% - AP Spine L1-L3 11/01/2018 70.7 Osteopenia -1.2 1.036 g/cm2 -0.5% - AP Spine L1-L3 10/26/2017 69.7 Osteopenia -1.1 1.041 g/cm2 0.3% - AP Spine  Temp 98.6 F (37 C) (Oral)   Ht 5\' 7"  (1.702 m)   Wt 166 lb (75.3 kg)   SpO2 96%   BMI 26.00 kg/m   BP Readings from Last 3 Encounters:  02/11/23 126/72  11/24/22 136/74  10/21/22 124/72    Wt Readings from Last 3 Encounters:  02/11/23 166 lb (75.3 kg)  11/24/22 166 lb (75.3 kg)  10/21/22 165 lb 12.8 oz (75.2 kg)    Physical Exam  Lab Results  Component Value Date   HGBA1C 6.0 02/11/2023   HGBA1C 6.1 08/13/2022   HGBA1C 6.0 02/05/2022    Lab Results  Component Value Date   CREATININE 0.63 02/11/2023   CREATININE 0.61 08/13/2022   CREATININE 0.64 02/05/2022    Lab  Results  Component Value Date   WBC 7.0 02/11/2023   HGB 14.5 02/11/2023   HCT 44.3 02/11/2023   PLT 342.0 02/11/2023   GLUCOSE 95 02/11/2023   CHOL 292 (H) 02/11/2023   TRIG 167.0 (H) 02/11/2023   HDL 50.70 02/11/2023   LDLDIRECT 109.0 08/13/2022   LDLCALC 208 (H) 02/11/2023   ALT 11 02/11/2023   AST 13 02/11/2023   NA 140 02/11/2023   K 3.8 02/11/2023   CL 102 02/11/2023   CREATININE 0.63 02/11/2023   BUN 17 02/11/2023   CO2 30 02/11/2023   TSH 2.93 02/11/2023   HGBA1C 6.0 02/11/2023   MICROALBUR <0.7 02/11/2023    MM 3D SCREEN BREAST UNI LEFT  Result Date: 11/25/2022 CLINICAL DATA:  Screening. EXAM: DIGITAL SCREENING UNILATERAL LEFT MAMMOGRAM WITH CAD AND TOMOSYNTHESIS TECHNIQUE: Left screening digital craniocaudal and mediolateral oblique mammograms were obtained. Left screening digital breast tomosynthesis was performed. The images were evaluated with computer-aided detection. COMPARISON:  Previous exam(s). ACR Breast Density Category c: The breasts are heterogeneously dense, which may obscure small masses. FINDINGS: The patient has had a right mastectomy. There are no findings suspicious for malignancy. IMPRESSION: No mammographic evidence of malignancy. A result letter of this screening mammogram will be mailed directly to the patient. RECOMMENDATION: Screening mammogram in one year.  (Code:SM-L-18M) BI-RADS CATEGORY  1: Negative. Electronically Signed   By: Sande Brothers M.D.   On: 11/25/2022 11:22   DG Bone Density  Result Date: 11/24/2022 EXAM: DUAL X-RAY ABSORPTIOMETRY (DXA) FOR BONE MINERAL DENSITY IMPRESSION: Your patient Ena Frickey completed a BMD test on 11/24/2022 using the [employer] iDXA DXA System (software version: 14.10) manufactured by [employer]. The following summarizes the results of our evaluation. Technologist:VLM PATIENT BIOGRAPHICAL: Name: Neave, Shames Patient ID:  [id number] Birth Date: 1945/11/10 Height:     67.0 in. Gender:      Female Exam Date:   11/24/2022 Weight:     164.0 lbs. Indications: History of Breast Cancer, Family Hx of Osteoporosis, Height Loss, Postmenopausal, Hypothyroid, Caucasian, Advanced Age Fractures: Treatments: Calcium, Femara, Synthroid, Vit D DENSITOMETRY RESULTS: Site         Region     Measured Date Measured Age WHO Classification Young Adult T-score BMD         %Change vs. Previous Significant Change (*) AP Spine L1-L3 11/24/2022 74.8 Normal -0.7 1.101 g/cm2 2.4% - AP Spine L1-L3 11/20/2021 73.8 Normal -0.9 1.075 g/cm2 6.9% Yes AP Spine L1-L3 11/12/2020 72.7 Osteopenia -1.4 1.006 g/cm2 -0.6% - AP Spine L1-L3 11/02/2019 71.7 Osteopenia -1.4 1.012 g/cm2 -2.3% - AP Spine L1-L3 11/01/2018 70.7 Osteopenia -1.2 1.036 g/cm2 -0.5% - AP Spine L1-L3 10/26/2017 69.7 Osteopenia -1.1 1.041 g/cm2 0.3% - AP Spine

## 2023-02-11 NOTE — Assessment & Plan Note (Signed)
Elevated reading today in the setting of chronic celebrex use  patietn advised to suspend for 2 weeks and follow BP

## 2023-02-12 ENCOUNTER — Encounter: Payer: Self-pay | Admitting: Internal Medicine

## 2023-02-14 MED ORDER — ROSUVASTATIN CALCIUM 5 MG PO TABS: 5.0000 mg | ORAL_TABLET | Freq: Every day | ORAL | 2 refills | Status: DC

## 2023-02-14 NOTE — Addendum Note (Signed)
Addended by: Sherlene Shams on: 02/14/2023 11:41 PM   Modules accepted: Orders

## 2023-02-16 ENCOUNTER — Other Ambulatory Visit: Payer: Self-pay | Admitting: Neurology

## 2023-02-16 DIAGNOSIS — G250 Essential tremor: Secondary | ICD-10-CM

## 2023-02-18 ENCOUNTER — Other Ambulatory Visit: Payer: Self-pay | Admitting: Neurology

## 2023-02-18 DIAGNOSIS — G250 Essential tremor: Secondary | ICD-10-CM

## 2023-05-01 ENCOUNTER — Other Ambulatory Visit: Payer: Self-pay | Admitting: Internal Medicine

## 2023-05-01 ENCOUNTER — Other Ambulatory Visit: Payer: Self-pay | Admitting: Neurology

## 2023-05-01 DIAGNOSIS — G250 Essential tremor: Secondary | ICD-10-CM

## 2023-06-22 ENCOUNTER — Other Ambulatory Visit: Payer: Self-pay | Admitting: Internal Medicine

## 2023-07-07 ENCOUNTER — Other Ambulatory Visit: Payer: Self-pay | Admitting: Neurology

## 2023-07-07 ENCOUNTER — Other Ambulatory Visit: Payer: Self-pay | Admitting: Internal Medicine

## 2023-07-07 DIAGNOSIS — G250 Essential tremor: Secondary | ICD-10-CM

## 2023-07-07 DIAGNOSIS — E039 Hypothyroidism, unspecified: Secondary | ICD-10-CM

## 2023-07-23 ENCOUNTER — Telehealth: Payer: Self-pay | Admitting: Internal Medicine

## 2023-07-23 NOTE — Telephone Encounter (Signed)
 Fax received from [facility] medical clearance. Paper work is upfront in Dr [employer]

## 2023-07-23 NOTE — Telephone Encounter (Signed)
 I have placed in red folder

## 2023-07-27 NOTE — Telephone Encounter (Signed)
 LMTCB. Need to scheduled pt for a surgical clearance with Dr. Darrick Huntsman.

## 2023-07-28 NOTE — Telephone Encounter (Signed)
 Surgical clearance has been scheduled.

## 2023-08-07 ENCOUNTER — Encounter: Payer: Self-pay | Admitting: Internal Medicine

## 2023-08-07 ENCOUNTER — Ambulatory Visit: Admitting: Internal Medicine

## 2023-08-07 VITALS — BP 120/70 | HR 78 | Temp 98.1°F | Ht 67.0 in | Wt 167.8 lb

## 2023-08-07 DIAGNOSIS — E875 Hyperkalemia: Secondary | ICD-10-CM

## 2023-08-07 DIAGNOSIS — M1610 Unilateral primary osteoarthritis, unspecified hip: Secondary | ICD-10-CM

## 2023-08-07 DIAGNOSIS — E782 Mixed hyperlipidemia: Secondary | ICD-10-CM

## 2023-08-07 DIAGNOSIS — R7303 Prediabetes: Secondary | ICD-10-CM

## 2023-08-07 DIAGNOSIS — Z01818 Encounter for other preprocedural examination: Secondary | ICD-10-CM

## 2023-08-07 DIAGNOSIS — Z853 Personal history of malignant neoplasm of breast: Secondary | ICD-10-CM

## 2023-08-07 DIAGNOSIS — M810 Age-related osteoporosis without current pathological fracture: Secondary | ICD-10-CM

## 2023-08-07 DIAGNOSIS — K5909 Other constipation: Secondary | ICD-10-CM

## 2023-08-07 DIAGNOSIS — E039 Hypothyroidism, unspecified: Secondary | ICD-10-CM

## 2023-08-07 MED ORDER — LUBIPROSTONE 8 MCG PO CAPS
8.0000 ug | ORAL_CAPSULE | Freq: Two times a day (BID) | ORAL | 0 refills | Status: DC
Start: 2023-08-07 — End: 2024-02-16

## 2023-08-07 MED ORDER — FLUTICASONE PROPIONATE 50 MCG/ACT NA SUSP
2.0000 | Freq: Every day | NASAL | 6 refills | Status: DC
Start: 2023-08-07 — End: 2024-02-16

## 2023-08-07 NOTE — Patient Instructions (Addendum)
  You can add up to 2000 mg of acetominophen (tylenol) every day safely  In divided doses (Or 1000 mg every 8 hours.)    Amitiza   for constipation.  Start with 8 mg dose once or twice daily  increase gradually to max dose is 24 mcg  twice daily

## 2023-08-07 NOTE — Progress Notes (Signed)
 Subjective:  Patient ID: Tiffany Chang, female    DOB: 12-23-1945  Age: 78 y.o. MRN: [id number]  CC: The primary encounter diagnosis was Pre-op examination. 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   HPI Tiffany Chang presents for  Chief Complaint  Patient presents with   Pre-op Exam     Tiffany Chang is a 78 yr old female with a history of hypertension,  hypothyroidism ,  breast cancer, and essential tremor who is here for preoperative clearance   1) Preoperative medical clearance, requested by her orthopedist, for future right TOTAL HIP REPLACEMENT  by Dr Diminick ([facility]). She has no history of CAD and  has had no recent episodes of chest pain.    2) hip pain : using aleve twice daily and tylenol  3) HTN:  BP ranges from 120/70 TO 135/80  4) h/o BRCA :  wants to start estrogen .  Reasons:  osteoporosis,  thinks it will protect her heart.    5) constipation worse since exercise has trailed off due to chronic constipation   Outpatient Medications Prior to Visit  Medication Sig Dispense Refill   alendronate (FOSAMAX) 70 MG tablet TAKE 1 TABLET (70 MG TOTAL) BY MOUTH ONCE A WEEK. TAKE WITH A FULL GLASS OF WATER ON AN EMPTY STOMACH. 12 tablet 3   amLODipine (NORVASC) 2.5 MG tablet TAKE 1 TABLET BY MOUTH EVERY DAY 90 tablet 1   Cholecalciferol (VITAMIN D3) 50 MCG (2000 UT) TABS Take 1 tablet by mouth daily.      levothyroxine (SYNTHROID) 88 MCG tablet TAKE 1 TABLET BY MOUTH EVERY DAY 90 tablet 1   naproxen sodium (ALEVE) 220 MG tablet Take 220 mg by mouth 2 (two) times daily as needed (pain).     primidone (MYSOLINE) 250 MG tablet TAKE 1/2 TABLET BY MOUTH EVERY MORNING AND AT BEDTIME 90 tablet 0   propranolol (INDERAL) 10 MG tablet TAKE 1 TABLET BY MOUTH TWICE A DAY 180 tablet 0   rosuvastatin (CRESTOR) 5 MG tablet TAKE 1 TABLET  (5 MG TOTAL) BY MOUTH DAILY. 90 tablet 2   celecoxib (CELEBREX) 200 MG capsule Take 1 tablet by mouth daily.     tranexamic acid (LYSTEDA) 650 MG TABS tablet 3 tab 2 hours prior to your surgery with sip of H2O right before leaving to head to surgery center or [facility], bring empty pill bottle with you. (Patient not taking: Reported on 08/07/2023)     triamcinolone cream (KENALOG) 0.5 % Apply topically. (Patient not taking: Reported on 08/07/2023)     No facility-administered medications prior to visit.    Review of Systems;  Patient denies headache, fevers, malaise, unintentional weight loss, skin rash, eye pain, sinus congestion and sinus pain, sore throat, dysphagia,  hemoptysis , cough, dyspnea, wheezing, chest pain, palpitations, orthopnea, edema, abdominal pain, nausea, melena, diarrhea, constipation, flank pain, dysuria, hematuria, urinary  Frequency, nocturia, numbness, tingling, seizures,  Focal weakness, Loss of consciousness,  Tremor, insomnia, depression, anxiety, and suicidal ideation.      Objective:  BP 120/70   Pulse 78   Temp 98.1 F (36.7 C) (Oral)   Ht 5\' 7"  (1.702 m)   Wt 167 lb 12.8 oz (76.1 kg)   SpO2 97%   BMI 26.28 kg/m   BP Readings from Last 3 Encounters:  08/07/23 120/70  02/11/23 126/72  11/24/22 136/74    Wt Readings from Last  3 Encounters:  08/07/23 167 lb 12.8 oz (76.1 kg)  02/11/23 166 lb (75.3 kg)  11/24/22 166 lb (75.3 kg)    Physical Exam Vitals reviewed.  Constitutional:      General: She is not in acute distress.    Appearance: Normal appearance. She is normal weight. She is not ill-appearing, toxic-appearing or diaphoretic.  HENT:     Head: Normocephalic.  Eyes:     General: No scleral icterus.       Right eye: No discharge.        Left eye: No discharge.     Conjunctiva/sclera: Conjunctivae normal.  Cardiovascular:     Rate and Rhythm: Normal rate and regular rhythm.     Heart sounds: Normal heart sounds.  Pulmonary:     Effort:  Pulmonary effort is normal. No respiratory distress.     Breath sounds: Normal breath sounds.  Musculoskeletal:        General: Normal range of motion.  Skin:    General: Skin is warm and dry.  Neurological:     General: No focal deficit present.     Mental Status: She is alert and oriented to person, place, and time. Mental status is at baseline.  Psychiatric:        Mood and Affect: Mood normal.        Behavior: Behavior normal.        Thought Content: Thought content normal.        Judgment: Judgment normal.    Lab Results  Component Value Date   HGBA1C 5.9 (H) 08/07/2023   HGBA1C 6.0 02/11/2023   HGBA1C 6.1 08/13/2022    Lab Results  Component Value Date   CREATININE 0.62 08/07/2023   CREATININE 0.63 02/11/2023   CREATININE 0.61 08/13/2022    Lab Results  Component Value Date   WBC 7.2 08/07/2023   HGB 13.9 08/07/2023   HCT 41.2 08/07/2023   PLT 302 08/07/2023   GLUCOSE 93 08/07/2023   CHOL 202 (H) 08/07/2023   TRIG 199 (H) 08/07/2023   HDL 49 (L) 08/07/2023   LDLDIRECT 109.0 08/13/2022   LDLCALC 121 (H) 08/07/2023   ALT 16 08/07/2023   AST 14 08/07/2023   NA 141 08/07/2023   K 5.0 08/07/2023   CL 103 08/07/2023   CREATININE 0.62 08/07/2023   BUN 22 08/07/2023   CO2 28 08/07/2023   TSH 2.95 08/07/2023   HGBA1C 5.9 (H) 08/07/2023   MICROALBUR <0.7 02/11/2023    MM 3D SCREEN BREAST UNI LEFT Result Date: 11/25/2022 CLINICAL DATA:  Screening. EXAM: DIGITAL SCREENING UNILATERAL LEFT MAMMOGRAM WITH CAD AND TOMOSYNTHESIS TECHNIQUE: Left screening digital craniocaudal and mediolateral oblique mammograms were obtained. Left screening digital breast tomosynthesis was performed. The images were evaluated with computer-aided detection. COMPARISON:  Previous exam(s). ACR Breast Density Category c: The breasts are heterogeneously dense, which may obscure small masses. FINDINGS: The patient has had a right mastectomy. There are no findings suspicious for malignancy.  IMPRESSION: No mammographic evidence of malignancy. A result letter of this screening mammogram will be mailed directly to the patient. RECOMMENDATION: Screening mammogram in one year.  (Code:SM-L-96M) BI-RADS CATEGORY  1: Negative. Electronically Signed   By: Sande Brothers M.D.   On: 11/25/2022 11:22   DG Bone Density Result Date: 11/24/2022 EXAM: DUAL X-RAY ABSORPTIOMETRY (DXA) FOR BONE MINERAL DENSITY IMPRESSION: Your patient Eknoor Novack completed a BMD test on 11/24/2022 using the [employer] iDXA DXA System (software version: 14.10) manufactured by [employer].  The following summarizes the results of our evaluation. Technologist:VLM PATIENT BIOGRAPHICAL: Name: Elmina, Hendel Patient ID:  [id number] Birth Date: March 21, 1946 Height:     67.0 in. Gender:      Female Exam Date:  11/24/2022 Weight:     164.0 lbs. Indications: History of Breast Cancer, Family Hx of Osteoporosis, Height Loss, Postmenopausal, Hypothyroid, Caucasian, Advanced Age Fractures: Treatments: Calcium, Femara, Synthroid, Vit D DENSITOMETRY RESULTS: Site         Region     Measured Date Measured Age WHO Classification Young Adult T-score BMD         %Change vs. Previous Significant Change (*) AP Spine L1-L3 11/24/2022 74.8 Normal -0.7 1.101 g/cm2 2.4% - AP Spine L1-L3 11/20/2021 73.8 Normal -0.9 1.075 g/cm2 6.9% Yes AP Spine L1-L3 11/12/2020 72.7 Osteopenia -1.4 1.006 g/cm2 -0.6% - AP Spine L1-L3 11/02/2019 71.7 Osteopenia -1.4 1.012 g/cm2 -2.3% - AP Spine L1-L3 11/01/2018 70.7 Osteopenia -1.2 1.036 g/cm2 -0.5% - AP Spine L1-L3 10/26/2017 69.7 Osteopenia -1.1 1.041 g/cm2 0.3% - AP Spine L1-L3 10/23/2016 68.7 Osteopenia -1.2 1.038 g/cm2 -5.6% Yes AP Spine L1-L3 10/23/2015 67.7 Normal -0.7 1.099 g/cm2 -0.2% - AP Spine L1-L3 10/31/2014 66.7 Normal -0.7 1.101 g/cm2 -0.5% - AP Spine L1-L3 08/31/2013 65.5 Normal -0.6 1.107 g/cm2 0.2% - AP Spine L1-L3 02/27/2010 62.0 Normal -0.6 1.105 g/cm2 - - DualFemur Neck Right 11/24/2022 74.8 Normal -0.3 0.998 g/cm2  21.3% Yes DualFemur Neck Right 11/20/2021 73.8 Osteopenia -1.5 0.823 g/cm2 2.2% - DualFemur Neck Right 11/12/2020 72.7 Osteopenia -1.7 0.805 g/cm2 -1.3% - DualFemur Neck Right 11/02/2019 71.7 Osteopenia -1.6 0.816 g/cm2 6.7% - DualFemur Neck Right 11/01/2018 70.7 Osteopenia -2.0 0.765 g/cm2 -0.5% - DualFemur Neck Right 10/26/2017 69.7 Osteopenia -1.9 0.769 g/cm2 -0.6% - DualFemur Neck Right 10/23/2016 68.7 Osteopenia -1.9 0.774 g/cm2 -4.1% - DualFemur Neck Right 10/23/2015 67.7 Osteopenia -1.7 0.807 g/cm2 0.2% - DualFemur Neck Right 10/31/2014 66.7 Osteopenia -1.7 0.805 g/cm2 1.5% - DualFemur Neck Right 02/27/2010 62.0 Osteopenia -1.8 0.793 g/cm2 - - DualFemur Total Mean 11/24/2022 74.8 Normal -0.9 0.888 g/cm2 4.5% Yes DualFemur Total Mean 11/20/2021 73.8 Osteopenia -1.3 0.850 g/cm2 0.1% - DualFemur Total Mean 11/12/2020 72.7 Osteopenia -1.3 0.849 g/cm2 -0.5% - DualFemur Total Mean 11/02/2019 71.7 Osteopenia -1.2 0.853 g/cm2 0.8% - DualFemur Total Mean 11/01/2018 70.7 Osteopenia -1.3 0.846 g/cm2 -0.5% - DualFemur Total Mean 10/26/2017 69.7 Osteopenia -1.3 0.850 g/cm2 -1.5% - DualFemur Total Mean 10/23/2016 68.7 Osteopenia -1.1 0.863 g/cm2 -2.6% Yes DualFemur Total Mean 10/23/2015 67.7 Normal -1.0 0.886 g/cm2 -0.1% - DualFemur Total Mean 10/31/2014 66.7 Normal -1.0 0.887 g/cm2 0.6% - DualFemur Total Mean 02/27/2010 62.0 Normal -1.0 0.882 g/cm2 - - Left Forearm Radius 33% 11/24/2022 74.8 Osteoporosis -3.5 0.573 g/cm2 0.9% - Left Forearm Radius 33% 11/20/2021 73.8 Osteoporosis -3.5 0.568 g/cm2 -4.1% - Left Forearm Radius 33% 10/26/2017 69.7 Osteoporosis -3.2 0.592 g/cm2 -6.9% Yes Left Forearm Radius 33% 10/23/2016 68.7 Osteoporosis -2.7 0.636 g/cm2 -2.6% - Left Forearm Radius 33% 10/31/2014 66.7 Osteoporosis -2.5 0.653 g/cm2 -3.1% - Left Forearm Radius 33% 08/31/2013 65.5 Osteopenia -2.3 0.674 g/cm2 -5.3% Yes Left Forearm Radius 33% 02/27/2010 62.0 Osteopenia -1.9 0.712 g/cm2 - - ASSESSMENT: The BMD measured at Forearm  Radius 33% is 0.573 g/cm2 with a T-score of -3.5. This patient is considered osteoporotic according to World Health Organization [facility]) criteria. L-4 was excluded due to degenerative changes. Compared with prior study, there has been no significant change in the spine. Compared with prior study, there has been significant increase in the total hip. The scan  quality is good. World Science writer [facility]) criteria for post-menopausal, Caucasian Women: Normal:                   T-score at or above -1 SD Osteopenia/low bone mass: T-score between -1 and -2.5 SD Osteoporosis:             T-score at or below -2.5 SD RECOMMENDATIONS: 1. All patients should optimize calcium and vitamin D intake. 2. Consider FDA-approved medical therapies in postmenopausal women and men aged 42 years and older, based on the following: a. A hip or vertebral(clinical or morphometric) fracture b. T-score < -2.5 at the femoral neck or spine after appropriate evaluation to exclude secondary causes c. Low bone mass (T-score between -1.0 and -2.5 at the femoral neck or spine) and a 10-year probability of a hip fracture > 3% or a 10-year probability of a major osteoporosis-related fracture > 20% based on the [place]-adapted WHO algorithm 3. Clinician judgment and/or patient preferences may indicate treatment for people with 10-year fracture probabilities above or below these levels FOLLOW-UP: People with diagnosed cases of osteoporosis or at high risk for fracture should have regular bone mineral density tests. For patients eligible for Medicare, routine testing is allowed once every 2 years. The testing frequency can be increased to one year for patients who have rapidly progressing disease, those who are receiving or discontinuing medical therapy to restore bone mass, or have additional risk factors. I have reviewed this report, and agree with the above findings. [facility] Electronically Signed   By: Frederico Hamman M.D.   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  Alendronate prescribed by Dr Orlie Dakin for progression of T scores. BMD in spine has improved from 2022 to 2023.  She has requested estrogen therapy   Preoperative clearance Assessment & Plan: Patient  is considered to be at low risk  For perioperative complications  Based on today's exam and history.  Baseline lytes,  hgb and ekg done. I have ordered and reviewed a 12 lead EKG and find that there are no acute changes and patient is in sinus rhythm.     Lab Results  Component Value Date   CREATININE 0.62 08/07/2023   Lab Results  Component Value Date   NA 141 08/07/2023   K 5.0 08/07/2023   CL 103 08/07/2023   CO2 28 08/07/2023   Lab Results  Component Value Date   WBC 7.2 08/07/2023   HGB 13.9 08/07/2023   HCT 41.2 08/07/2023   MCV 89.4 08/07/2023   PLT 302 08/07/2023  '   Other orders -     Fluticasone Propionate; Place 2 sprays into both nostrils daily. For allergic rhinitis  Dispense: 16 g; Refill: 6 -     Lubiprostone; Take 1 capsule (8 mcg total) by mouth 2 (two) times daily with a meal.  Dispense: 180 capsule; Refill: 0     I spent 34 minutes on the day of this face  to face encounter reviewing patient's  most recent visit with cardiology,   and oncology, ,  prior relevant surgical and non surgical procedures, recent  labs and imaging studies, counseling on weight management,  reviewing the assessment and plan with patient, and post visit ordering and reviewing of  diagnostics and therapeutics with  patient  .   Follow-up: Return in about 6 months (around 02/07/2024).   Sherlene Shams, MD

## 2023-08-07 NOTE — Progress Notes (Signed)
 Assessment/Plan:   1.  Essential Tremor  -She is s/p left thalamic focused ultrasound December 18, 2021 at [facility].  She asked about doing the other hand, but its not that bothersome and I don't recommend it.  She doesn't disagree  -We have decided to stay on medication because she still has significant tremor of the left hand.  -Continue propranolol, 10 mg, 1 twice per day.  She tried to decrease this and ended up having more tremor and went back on it.  At this point, we decided to stay on current medications unless she has lowering of blood pressure or some other reason we need to take her off of it.  -continue primidone 250 mg, 1/2 po bid.    2.  Right hip pain  -No objections to surgery from my standpoint.  3..F/U 1 year   Subjective:   Tiffany Chang was seen today in follow up for essential tremor.  My previous records were reviewed prior to todays visit.  Last visit, we discussed weaning her off of the propranolol.  She started to do that and noticed more tremor, so decided to continue to take the 1 twice per day, which I had no objection to it.  She reports L hand still has tremor - "no better, no worse."  She is having hip pain and is planning to have a hip surgery in [place]. she also plans to travel soon to [place] and asks about travel plans and if any of her medications will be problematic in that regard.  Current prescribed movement disorder medications: Primidone, 250, 1/2 tablet twice per day Propranolol 10 mg twice per day  ALLERGIES:   Allergies  Allergen Reactions   Statins Other (See Comments)     Leg cramps in high doses   Bee Venom Hives    CURRENT MEDICATIONS:  Outpatient Encounter Medications as of 08/11/2023  Medication Sig   alendronate (FOSAMAX) 70 MG tablet TAKE 1 TABLET (70 MG TOTAL) BY MOUTH ONCE A WEEK. TAKE WITH A FULL GLASS OF WATER ON AN EMPTY STOMACH.   amLODipine (NORVASC) 2.5 MG tablet TAKE 1 TABLET BY MOUTH EVERY DAY   Cholecalciferol (VITAMIN D3) 50  MCG (2000 UT) TABS Take 1 tablet by mouth daily.    fluticasone (FLONASE) 50 MCG/ACT nasal spray Place 2 sprays into both nostrils daily. For allergic rhinitis   levothyroxine (SYNTHROID) 88 MCG tablet TAKE 1 TABLET BY MOUTH EVERY DAY   lubiprostone (AMITIZA) 8 MCG capsule Take 1 capsule (8 mcg total) by mouth 2 (two) times daily with a meal.   naproxen sodium (ALEVE) 220 MG tablet Take 220 mg by mouth 2 (two) times daily as needed (pain).   primidone (MYSOLINE) 250 MG tablet TAKE 1/2 TABLET BY MOUTH EVERY MORNING AND AT BEDTIME   propranolol (INDERAL) 10 MG tablet TAKE 1 TABLET BY MOUTH TWICE A DAY   rosuvastatin (CRESTOR) 5 MG tablet TAKE 1 TABLET (5 MG TOTAL) BY MOUTH DAILY.   tranexamic acid (LYSTEDA) 650 MG TABS tablet 3 tab 2 hours prior to your surgery with sip of H2O right before leaving to head to surgery center or [facility], bring empty pill bottle with you. (Patient not taking: Reported on 08/07/2023)   No facility-administered encounter medications on file as of 08/11/2023.     Objective:    PHYSICAL EXAMINATION:    VITALS:   Vitals:   08/11/23 1033  BP: 122/80  Pulse: 74  SpO2: 98%  Weight: 164 lb 14.4  oz (74.8 kg)     GEN:  The patient appears stated age and is in NAD. HEENT:  Normocephalic, atraumatic.  The mucous membranes are moist.  CV:  RRR Lungs:  CTAB Neck.  No bruits  Neurological examination:  Orientation: The patient is alert and oriented x3. Cranial nerves: There is good facial symmetry. The speech is fluent and clear. Soft palate rises symmetrically and there is no tongue deviation. Hearing is intact to conversational tone. Sensation: Sensation is intact to light touch throughout Motor: Strength is at least antigravity x4.  Movement examination: Tone: There is normal tone in the UE/LE Abnormal movements: no rest tremor.  No postural tremor.  Has no intention tremor on the R but has mild to mod on the L.  This has been stable the last few visits.  She has  trouble with Archimedes spirals.  She has slight trouble getting the pen to the paper on the right, but once she does, the spirals are drawn fairly well.  She has most trouble on the left.       Gait/station:  ambulates well in the hall with good arm swing b/l.    I have reviewed and interpreted the following labs independently   Chemistry      Component Value Date/Time   NA 141 08/07/2023 1523   K 5.0 08/07/2023 1523   CL 103 08/07/2023 1523   CO2 28 08/07/2023 1523   BUN 22 08/07/2023 1523   CREATININE 0.62 08/07/2023 1523      Component Value Date/Time   CALCIUM 9.7 08/07/2023 1523   ALKPHOS 68 02/11/2023 1000   AST 14 08/07/2023 1523   ALT 16 08/07/2023 1523   BILITOT 0.2 08/07/2023 1523      Lab Results  Component Value Date   WBC 7.2 08/07/2023   HGB 13.9 08/07/2023   HCT 41.2 08/07/2023   MCV 89.4 08/07/2023   PLT 302 08/07/2023   Lab Results  Component Value Date   TSH 2.95 08/07/2023     Chemistry      Component Value Date/Time   NA 141 08/07/2023 1523   K 5.0 08/07/2023 1523   CL 103 08/07/2023 1523   CO2 28 08/07/2023 1523   BUN 22 08/07/2023 1523   CREATININE 0.62 08/07/2023 1523      Component Value Date/Time   CALCIUM 9.7 08/07/2023 1523   ALKPHOS 68 02/11/2023 1000   AST 14 08/07/2023 1523   ALT 16 08/07/2023 1523   BILITOT 0.2 08/07/2023 1523     Total time spent on today's visit was 38 minutes, including both face-to-face time and nonface-to-face time.  Time included that spent on review of records (prior notes available to me/labs/imaging if pertinent), discussing treatment and goals, answering patient's questions and coordinating care.   Cc:  Sherlene Shams, MD

## 2023-08-08 LAB — CBC WITH DIFFERENTIAL/PLATELET
Absolute Lymphocytes: 1562 {cells}/uL (ref 850–3900)
Absolute Monocytes: 439 {cells}/uL (ref 200–950)
Basophils Absolute: 43 {cells}/uL (ref 0–200)
Basophils Relative: 0.6 %
Eosinophils Absolute: 151 {cells}/uL (ref 15–500)
Eosinophils Relative: 2.1 %
HCT: 41.2 % (ref 35.0–45.0)
Hemoglobin: 13.9 g/dL (ref 11.7–15.5)
MCH: 30.2 pg (ref 27.0–33.0)
MCHC: 33.7 g/dL (ref 32.0–36.0)
MCV: 89.4 fL (ref 80.0–100.0)
MPV: 9 fL (ref 7.5–12.5)
Monocytes Relative: 6.1 %
Neutro Abs: 5004 {cells}/uL (ref 1500–7800)
Neutrophils Relative %: 69.5 %
Platelets: 302 10*3/uL (ref 140–400)
RBC: 4.61 10*6/uL (ref 3.80–5.10)
RDW: 12.9 % (ref 11.0–15.0)
Total Lymphocyte: 21.7 %
WBC: 7.2 10*3/uL (ref 3.8–10.8)

## 2023-08-08 LAB — HEMOGLOBIN A1C
Hgb A1c MFr Bld: 5.9 %{Hb} — ABNORMAL HIGH (ref <5.7)
Mean Plasma Glucose: 123 mg/dL
eAG (mmol/L): 6.8 mmol/L

## 2023-08-08 LAB — URINALYSIS, ROUTINE W REFLEX MICROSCOPIC
Bilirubin Urine: NEGATIVE
Glucose, UA: NEGATIVE
Hgb urine dipstick: NEGATIVE
Ketones, ur: NEGATIVE
Leukocytes,Ua: NEGATIVE
Nitrite: NEGATIVE
Protein, ur: NEGATIVE
Specific Gravity, Urine: 1.017 (ref 1.001–1.035)
pH: 6 (ref 5.0–8.0)

## 2023-08-08 LAB — COMPREHENSIVE METABOLIC PANEL
AG Ratio: 1.8 (calc) (ref 1.0–2.5)
ALT: 16 U/L (ref 6–29)
AST: 14 U/L (ref 10–35)
Albumin: 4.4 g/dL (ref 3.6–5.1)
Alkaline phosphatase (APISO): 56 U/L (ref 37–153)
BUN: 22 mg/dL (ref 7–25)
CO2: 28 mmol/L (ref 20–32)
Calcium: 9.7 mg/dL (ref 8.6–10.4)
Chloride: 103 mmol/L (ref 98–110)
Creat: 0.62 mg/dL (ref 0.60–1.00)
Globulin: 2.4 g/dL (ref 1.9–3.7)
Glucose, Bld: 93 mg/dL (ref 65–99)
Potassium: 5 mmol/L (ref 3.5–5.3)
Sodium: 141 mmol/L (ref 135–146)
Total Bilirubin: 0.2 mg/dL (ref 0.2–1.2)
Total Protein: 6.8 g/dL (ref 6.1–8.1)
eGFR: 93 mL/min/{1.73_m2} (ref >=60)

## 2023-08-08 LAB — LIPID PANEL W/REFLEX DIRECT LDL
Cholesterol: 202 mg/dL — ABNORMAL HIGH (ref <200)
HDL: 49 mg/dL — ABNORMAL LOW (ref >=50)
LDL Cholesterol (Calc): 121 mg/dL — ABNORMAL HIGH
Non-HDL Cholesterol (Calc): 153 mg/dL — ABNORMAL HIGH (ref <130)
Total CHOL/HDL Ratio: 4.1 (calc) (ref <5.0)
Triglycerides: 199 mg/dL — ABNORMAL HIGH (ref <150)

## 2023-08-08 LAB — TSH: TSH: 2.95 m[IU]/L (ref 0.40–4.50)

## 2023-08-09 DIAGNOSIS — Z01818 Encounter for other preprocedural examination: Secondary | ICD-10-CM | POA: Insufficient documentation

## 2023-08-09 NOTE — Assessment & Plan Note (Signed)
 Patient  is considered to be at low risk  For perioperative complications  Based on today's exam and history.  Baseline lytes,  hgb and ekg done. I have ordered and reviewed a 12 lead EKG and find that there are no acute changes and patient is in sinus rhythm.     Lab Results  Component Value Date   CREATININE 0.62 08/07/2023   Lab Results  Component Value Date   NA 141 08/07/2023   K 5.0 08/07/2023   CL 103 08/07/2023   CO2 28 08/07/2023   Lab Results  Component Value Date   WBC 7.2 08/07/2023   HGB 13.9 08/07/2023   HCT 41.2 08/07/2023   MCV 89.4 08/07/2023   PLT 302 08/07/2023  '

## 2023-08-09 NOTE — Assessment & Plan Note (Signed)
 Now on alendronate after 5 years of EVista.  Alendronate prescribed by Dr Orlie Dakin for progression of T scores. BMD in spine has improved from 2022 to 2023.  She has requested estrogen therapy

## 2023-08-10 DIAGNOSIS — K5909 Other constipation: Secondary | ICD-10-CM | POA: Insufficient documentation

## 2023-08-10 NOTE — Assessment & Plan Note (Signed)
Patient  is considered to be at low risk  For perioperative complications  Based on today's exam and history.  Baseline lytes,  hgb and ekg done.  

## 2023-08-10 NOTE — Assessment & Plan Note (Signed)
 She was diagnosed with invasive lobular carcinoma of the right breast in 2016 and was treated with simple mastectomy and letrozole until 2021.  Annual films have shown no recurrence.  Her reasons for wanting estrogen therapy were not quality of life issues,  more preventive issues , which I have opined are not worth the risk given that there are more more acceptable and safer ways to address both issues

## 2023-08-10 NOTE — Assessment & Plan Note (Signed)
 Trial of amitiza initiated with low starting dose

## 2023-08-11 ENCOUNTER — Encounter: Payer: Self-pay | Admitting: Neurology

## 2023-08-11 ENCOUNTER — Ambulatory Visit (INDEPENDENT_AMBULATORY_CARE_PROVIDER_SITE_OTHER): Payer: Medicare Other | Admitting: Neurology

## 2023-08-11 VITALS — BP 122/80 | HR 74 | Ht 67.0 in | Wt 164.9 lb

## 2023-08-11 DIAGNOSIS — M25551 Pain in right hip: Secondary | ICD-10-CM

## 2023-08-11 DIAGNOSIS — G250 Essential tremor: Secondary | ICD-10-CM

## 2023-08-11 NOTE — Patient Instructions (Signed)
Good to see you!  The physicians and staff at [facility] are committed to providing excellent care. You may receive a survey requesting feedback about your experience at our office. We strive to receive "very good" responses to the survey questions. If you feel that your experience would prevent you from giving the office a "very good " response, please contact our office to try to remedy the situation. We may be reached at [phone]. Thank you for taking the time out of your busy day to complete the survey.

## 2023-08-12 ENCOUNTER — Ambulatory Visit: Payer: Medicare Other | Admitting: Internal Medicine

## 2023-08-19 ENCOUNTER — Ambulatory Visit: Payer: Medicare Other | Admitting: Internal Medicine

## 2023-08-25 ENCOUNTER — Other Ambulatory Visit: Payer: Self-pay | Admitting: Oncology

## 2023-08-25 ENCOUNTER — Other Ambulatory Visit: Payer: Self-pay | Admitting: Neurology

## 2023-08-25 DIAGNOSIS — C50411 Malignant neoplasm of upper-outer quadrant of right female breast: Secondary | ICD-10-CM

## 2023-08-25 DIAGNOSIS — G250 Essential tremor: Secondary | ICD-10-CM

## 2023-08-25 DIAGNOSIS — M810 Age-related osteoporosis without current pathological fracture: Secondary | ICD-10-CM

## 2023-08-25 DIAGNOSIS — Z1231 Encounter for screening mammogram for malignant neoplasm of breast: Secondary | ICD-10-CM

## 2023-09-23 ENCOUNTER — Other Ambulatory Visit: Payer: Self-pay | Admitting: Oncology

## 2023-10-05 HISTORY — PX: TOTAL HIP ARTHROPLASTY: SHX124

## 2023-10-06 ENCOUNTER — Encounter (INDEPENDENT_AMBULATORY_CARE_PROVIDER_SITE_OTHER): Payer: Self-pay

## 2023-11-09 ENCOUNTER — Other Ambulatory Visit: Payer: Self-pay | Admitting: Internal Medicine

## 2023-11-09 ENCOUNTER — Other Ambulatory Visit: Payer: Self-pay | Admitting: Neurology

## 2023-11-09 DIAGNOSIS — G250 Essential tremor: Secondary | ICD-10-CM

## 2023-11-10 ENCOUNTER — Ambulatory Visit

## 2023-11-25 ENCOUNTER — Ambulatory Visit
Admission: RE | Admit: 2023-11-25 | Discharge: 2023-11-25 | Disposition: A | Discharge status: Home / Self Care | Source: Ambulatory Visit | Attending: Oncology | Admitting: Oncology

## 2023-11-25 DIAGNOSIS — Z1231 Encounter for screening mammogram for malignant neoplasm of breast: Secondary | ICD-10-CM | POA: Diagnosis present

## 2023-11-25 DIAGNOSIS — M810 Age-related osteoporosis without current pathological fracture: Secondary | ICD-10-CM | POA: Insufficient documentation

## 2023-11-25 DIAGNOSIS — Z170 Estrogen receptor positive status [ER+]: Secondary | ICD-10-CM | POA: Diagnosis present

## 2023-11-25 DIAGNOSIS — C50411 Malignant neoplasm of upper-outer quadrant of right female breast: Secondary | ICD-10-CM | POA: Diagnosis present

## 2023-12-10 ENCOUNTER — Telehealth: Payer: Medicare Other | Admitting: Oncology

## 2023-12-17 ENCOUNTER — Telehealth: Payer: Self-pay

## 2023-12-17 ENCOUNTER — Inpatient Hospital Stay: Attending: Oncology | Admitting: Oncology

## 2023-12-17 DIAGNOSIS — Z170 Estrogen receptor positive status [ER+]: Secondary | ICD-10-CM

## 2023-12-17 DIAGNOSIS — M810 Age-related osteoporosis without current pathological fracture: Secondary | ICD-10-CM

## 2023-12-17 DIAGNOSIS — C50411 Malignant neoplasm of upper-outer quadrant of right female breast: Secondary | ICD-10-CM

## 2023-12-17 DIAGNOSIS — Z1231 Encounter for screening mammogram for malignant neoplasm of breast: Secondary | ICD-10-CM | POA: Diagnosis not present

## 2023-12-17 NOTE — Telephone Encounter (Signed)
 Tried to call patient in regards to her MyChart visit with Dr. Georgina today at 2:45 pm. No answer

## 2023-12-17 NOTE — Progress Notes (Unsigned)
 [facility]  [phone]) [phone] Fax:(336) [phone]  ID: Tiffany Chang OB: [birth year]  MR#: [id number]  RDW#:[id number]  Patient Care Team: Marylynn Verneita CROME, MD as PCP - General (Internal Medicine) Jacobo Evalene PARAS, MD as Consulting Physician (Oncology)  I connected with [name] on 12/18/23 at  2:45 PM EDT by video enabled telemedicine visit and verified that I am speaking with the correct person using two identifiers.   I discussed the limitations, risks, security and privacy concerns of performing an evaluation and management service by telemedicine and the availability of in-person appointments. I also discussed with the patient that there may be a patient responsible charge related to this service. The patient expressed understanding and agreed to proceed.   Other persons participating in the visit and their role in the encounter: Patient, MD.  Patient's location: Home. Provider's location: Clinic.  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  INTERVAL HISTORY: Patient agreed to video-assisted telemedicine visit for her routine yearly evaluation and discussion of her mammogram and bone mineral density results.  She recently had her first grandson as well as a hip replacement.  She continues to feel well and remains asymptomatic. She has no new neurologic complaints.  She denies any recent fevers or illnesses.  She has a good appetite and denies weight loss.  She denies any chest pain, shortness of breath, cough, or hemoptysis.  She denies any nausea, vomiting, constipation, or diarrhea. She has no urinary complaints.  Patient offers no specific complaints today.  REVIEW OF SYSTEMS:   Review of Systems  Constitutional: Negative.  Negative for fever, malaise/fatigue and weight loss.  Respiratory: Negative.  Negative for cough and shortness of breath.   Cardiovascular:  Negative.  Negative for chest pain and leg swelling.  Gastrointestinal: Negative.  Negative for abdominal pain.  Genitourinary: Negative.  Negative for dysuria.  Musculoskeletal: Negative.  Negative for back pain.  Skin: Negative.  Negative for rash.  Neurological: Negative.  Negative for dizziness, tremors, sensory change, focal weakness, weakness and headaches.  Psychiatric/Behavioral: Negative.  The patient is not nervous/anxious.     As per HPI. Otherwise, a complete review of systems is negative.  PAST MEDICAL HISTORY: Past Medical History:  Diagnosis Date   Breast cancer (HCC) 2016   ILC - RT MASTECTOMY   Hashimoto's thyroiditis    History of arthroscopy of left knee 1985   Hyperlipidemia    mild, with prior statin intolerance   Hypothyroid    Osteoporosis    prior Evista use x 5 yrs , 2007   Spinal stenosis    mild   Syncope, vasovagal    3 episodes   Tremor    Viral meningitis    78 years old    PAST SURGICAL HISTORY: Past Surgical History:  Procedure Laterality Date   CATARACT EXTRACTION W/PHACO Left 09/30/2017   Procedure: CATARACT EXTRACTION PHACO AND INTRAOCULAR LENS PLACEMENT (IOC);  Surgeon: Jaye Fallow, MD;  Location: [facility];  Service: Ophthalmology;  Laterality: Left;  US  00:40 AP% 15.7 CDE 6.38 Fluid pack lot # [id number]   CATARACT EXTRACTION W/PHACO Right 06/07/2019   Procedure: CATARACT EXTRACTION PHACO AND INTRAOCULAR LENS PLACEMENT (IOC) RIGHT 4.62 00:33.5;  Surgeon: Jaye Fallow, MD;  Location: [facility];  Service: Ophthalmology;  Laterality: Right;   CESAREAN SECTION     CHOLECYSTECTOMY  2006   COLONOSCOPY WITH PROPOFOL  N/A 09/26/2019   Procedure: COLONOSCOPY  WITH BIOPSY;  Surgeon: Jinny Carmine, MD;  Location: [facility];  Service: Endoscopy;  Laterality: N/A;  priority 3   FOOT SURGERY     plantar fascitis, Dr. Lilli   KNEE ARTHROSCOPY  1985   right   MASTECTOMY Right 2016   IL[facility]   POLYPECTOMY N/A 09/26/2019    Procedure: POLYPECTOMY;  Surgeon: Jinny Carmine, MD;  Location: [facility];  Service: Endoscopy;  Laterality: N/A;   SENTINEL NODE BIOPSY Right 10/10/2014   Procedure: SENTINEL NODE BIOPSY;  Surgeon: Larinda Unknown Sharps, MD;  Location: [facility];  Service: General;  Laterality: Right;   SIMPLE MASTECTOMY WITH AXILLARY SENTINEL NODE BIOPSY Right 10/10/2014   Procedure: SIMPLE MASTECTOMY;  Surgeon: Larinda Unknown Sharps, MD;  Location: [facility];  Service: General;  Laterality: Right;   TONSILLECTOMY      FAMILY HISTORY Family History  Problem Relation Age of Onset   Osteoporosis Mother    Aortic aneurysm Father    Cancer Sister 72       Breast   Breast cancer Sister 80   Cancer Maternal Grandfather        breast   Breast cancer Maternal Grandmother 68       ADVANCED DIRECTIVES:    HEALTH MAINTENANCE: Social History   Tobacco Use   Smoking status: Never   Smokeless tobacco: Never  Vaping Use   Vaping status: Never Used  Substance Use Topics   Alcohol use: Yes    Alcohol/week: 3.0 standard drinks of alcohol    Types: 3 Standard drinks or equivalent per week    Comment: social, 2 times per week   Drug use: No     Colonoscopy:  PAP:  Bone density:  Lipid panel:  Allergies  Allergen Reactions   Statins Other (See Comments)     Leg cramps in high doses   Bee Venom Hives    Current Outpatient Medications  Medication Sig Dispense Refill   alendronate  (FOSAMAX ) 70 MG tablet TAKE 1 TABLET (70 MG TOTAL) BY MOUTH ONCE A WEEK. TAKE WITH A FULL GLASS OF WATER  ON AN EMPTY STOMACH. 12 tablet 3   amLODipine  (NORVASC ) 2.5 MG tablet TAKE 1 TABLET BY MOUTH EVERY DAY 90 tablet 1   Cholecalciferol (VITAMIN D3) 50 MCG (2000 UT) TABS Take 1 tablet by mouth daily.      fluticasone  (FLONASE ) 50 MCG/ACT nasal spray Place 2 sprays into both nostrils daily. For allergic rhinitis 16 g 6   levothyroxine  (SYNTHROID ) 88 MCG tablet TAKE 1 TABLET BY MOUTH EVERY DAY 90 tablet 1    lubiprostone  (AMITIZA ) 8 MCG capsule Take 1 capsule (8 mcg total) by mouth 2 (two) times daily with a meal. 180 capsule 0   naproxen sodium (ALEVE) 220 MG tablet Take 220 mg by mouth 2 (two) times daily as needed (pain).     primidone  (MYSOLINE ) 250 MG tablet TAKE 1/2 TABLET BY MOUTH EVERY MORNING AND AT BEDTIME 90 tablet 0   propranolol  (INDERAL ) 10 MG tablet Take 1 tablet (10 mg total) by mouth daily. 90 tablet 0   rosuvastatin  (CRESTOR ) 5 MG tablet TAKE 1 TABLET (5 MG TOTAL) BY MOUTH DAILY. 90 tablet 2   tranexamic acid (LYSTEDA) 650 MG TABS tablet      No current facility-administered medications for this visit.    OBJECTIVE: There were no vitals filed for this visit.    There is no height or weight on file to calculate BMI.    ECOG FS:0 - Asymptomatic  General: Well-developed, well-nourished, no acute distress. HEENT: Normocephalic. Neuro: Alert, answering all questions appropriately. Cranial nerves grossly intact. Psych: Normal affect.  LAB RESULTS:  Lab Results  Component Value Date   NA 141 08/07/2023   K 5.0 08/07/2023   CL 103 08/07/2023   CO2 28 08/07/2023   GLUCOSE 93 08/07/2023   BUN 22 08/07/2023   CREATININE 0.62 08/07/2023   CALCIUM  9.7 08/07/2023   PROT 6.8 08/07/2023   ALBUMIN 4.2 02/11/2023   AST 14 08/07/2023   ALT 16 08/07/2023   ALKPHOS 68 02/11/2023   BILITOT 0.2 08/07/2023   GFRNONAA >60 10/03/2014   GFRAA >60 10/03/2014    Lab Results  Component Value Date   WBC 7.2 08/07/2023   NEUTROABS 5,004 08/07/2023   HGB 13.9 08/07/2023   HCT 41.2 08/07/2023   MCV 89.4 08/07/2023   PLT 302 08/07/2023     STUDIES: MM 3D SCREENING MAMMOGRAM UNILATERAL LEFT BREAST Result Date: 11/30/2023 CLINICAL DATA:  Screening. EXAM: DIGITAL SCREENING UNILATERAL LEFT MAMMOGRAM WITH CAD AND TOMOSYNTHESIS TECHNIQUE: Left screening digital craniocaudal and mediolateral oblique mammograms were obtained. Left screening digital breast tomosynthesis was performed. The  images were evaluated with computer-aided detection. COMPARISON:  Previous exam(s). ACR Breast Density Category c: The breasts are heterogeneously dense, which may obscure small masses. FINDINGS: There are no findings suspicious for malignancy. IMPRESSION: No mammographic evidence of malignancy. A result letter of this screening mammogram will be mailed directly to the patient. RECOMMENDATION: Screening mammogram in one year. (Code:SM-B-01Y) BI-RADS CATEGORY  1: Negative. Electronically Signed   By: Alm Parkins M.D.   On: 11/30/2023 12:31   DG BONE DENSITY (DXA) Result Date: 11/25/2023 EXAM: DUAL X-RAY ABSORPTIOMETRY (DXA) FOR BONE MINERAL DENSITY 11/25/2023 2:35 pm CLINICAL DATA:  78 year old Female Postmenopausal. Screening for osteoporosis Patient is or has been on bone building therapies. TECHNIQUE: An axial (e.g., hips, spine) and/or appendicular (e.g., radius) exam was performed, as appropriate, using GE Secretary/administrator at [facility]. Images are obtained for bone mineral density measurement and are not obtained for diagnostic purposes. [id number] Exclusions: L4 due to degenerative changes, right hip due to replacement COMPARISON:  11/24/2022 FINDINGS: Scan quality: Good. LUMBAR SPINE (L1-L3): BMD (in g/cm2): 1.155 T-score: -0.2 Z-score: 1.5 Rate of change from previous exam: 4.9 % LEFT FEMORAL NECK: BMD (in g/cm2): 0.868 T-score: -1.2 Z-score: 0.7 LEFT TOTAL HIP: BMD (in g/cm2): 0.934 T-score: -0.6 Z-score: 1.2 Rate of change from previous exam: 2.5 % FRAX 10-YEAR PROBABILITY OF FRACTURE: FRAX not reported as the patient is receiving bone building therapy. IMPRESSION: Osteopenia based on BMD. Fracture risk is unknown due to history of bone building therapy. RECOMMENDATIONS: 1. All patients should optimize calcium  and vitamin D  intake. 2. Consider FDA-approved medical therapies in postmenopausal women and men aged 22 years and older, based on the following: - A hip or vertebral  (clinical or morphometric) fracture - T-score less than or equal to -2.5 and secondary causes have been excluded. - Low bone mass (T-score between -1.0 and -2.5) and a 10-year probability of a hip fracture greater than or equal to 3% or a 10-year probability of a major osteoporosis-related fracture greater than or equal to 20% based on the [place] -adapted WHO algorithm. - Clinician judgment and/or patient preferences may indicate treatment for people with 10-year fracture probabilities above or below these levels 3. Patients with diagnosis of osteoporosis or at high risk for fracture should have regular bone mineral density tests. For patients eligible for  Medicare, routine testing is allowed once every 2 years. The testing frequency can be increased to one year for patients who have rapidly progressing disease, those who are receiving or discontinuing medical therapy to restore bone mass, or have additional risk factors. Electronically Signed   By: Dina  Arceo M.D.   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 Given the fact that her malignancy was lobular in nature, she did not require chemotherapy.  Patient completed 5 years of letrozole  in approximately June 2023 and discontinued treatment.  Her most recent left breast screening mammogram on November 25, 2023 was reported as BI-RADS 1.  Repeat in July 2026.  Return to clinic in 1 year with video-assisted telemedicine visit at which point patient can be discharged from clinic.   Osteopenia: Patient's most recent bone mineral density on November 25, 2023 reported significant improvement.  Patient no longer has osteoporosis.   Her highest T-score was reported -1.2.  She has been instructed to discontinue Fosamax , but continue calcium  and vitamin D  supplementation.  No further intervention is needed.  Repeat bone mineral density along with mammogram in July 2026.   Essential tremor: Patient does not complain of this today.  Significantly improved with intracranial procedure completed at [facility].  I provided 20 minutes of face-to-face video visit time during this encounter which included chart review, counseling, and coordination of care as documented above.    Patient expressed understanding and was in agreement with this plan. She also understands that She can call clinic at any time with any questions, concerns, or complaints.    Evalene JINNY Reusing, MD   12/17/2023 2:44 PM

## 2024-01-06 ENCOUNTER — Other Ambulatory Visit: Payer: Self-pay | Admitting: Internal Medicine

## 2024-01-06 DIAGNOSIS — E039 Hypothyroidism, unspecified: Secondary | ICD-10-CM

## 2024-01-09 ENCOUNTER — Other Ambulatory Visit: Payer: Self-pay | Admitting: Neurology

## 2024-01-09 DIAGNOSIS — G250 Essential tremor: Secondary | ICD-10-CM

## 2024-01-19 ENCOUNTER — Encounter: Payer: Self-pay | Admitting: Neurology

## 2024-01-19 DIAGNOSIS — G250 Essential tremor: Secondary | ICD-10-CM

## 2024-01-19 MED ORDER — PROPRANOLOL HCL 10 MG PO TABS: 10.0000 mg | ORAL_TABLET | Freq: Two times a day (BID) | ORAL | 2 refills | Status: AC

## 2024-02-10 ENCOUNTER — Ambulatory Visit: Admitting: Internal Medicine

## 2024-02-16 ENCOUNTER — Encounter: Payer: Self-pay | Admitting: Internal Medicine

## 2024-02-16 ENCOUNTER — Ambulatory Visit: Admitting: Internal Medicine

## 2024-02-16 VITALS — BP 118/70 | HR 71 | Ht 67.0 in | Wt 168.0 lb

## 2024-02-16 DIAGNOSIS — Z96641 Presence of right artificial hip joint: Secondary | ICD-10-CM

## 2024-02-16 DIAGNOSIS — E785 Hyperlipidemia, unspecified: Secondary | ICD-10-CM | POA: Diagnosis not present

## 2024-02-16 DIAGNOSIS — K5909 Other constipation: Secondary | ICD-10-CM

## 2024-02-16 DIAGNOSIS — E039 Hypothyroidism, unspecified: Secondary | ICD-10-CM | POA: Diagnosis not present

## 2024-02-16 DIAGNOSIS — R7303 Prediabetes: Secondary | ICD-10-CM | POA: Diagnosis not present

## 2024-02-16 DIAGNOSIS — M810 Age-related osteoporosis without current pathological fracture: Secondary | ICD-10-CM

## 2024-02-16 DIAGNOSIS — I10 Essential (primary) hypertension: Secondary | ICD-10-CM | POA: Diagnosis not present

## 2024-02-16 DIAGNOSIS — D126 Benign neoplasm of colon, unspecified: Secondary | ICD-10-CM

## 2024-02-16 LAB — LIPID PANEL
Cholesterol: 193 mg/dL (ref 0–200)
HDL: 45 mg/dL (ref >39.00)
LDL Cholesterol: 113 mg/dL — ABNORMAL HIGH (ref 0–99)
NonHDL: 148.05
Total CHOL/HDL Ratio: 4
Triglycerides: 177 mg/dL — ABNORMAL HIGH (ref 0.0–149.0)
VLDL: 35.4 mg/dL (ref 0.0–40.0)

## 2024-02-16 LAB — COMPREHENSIVE METABOLIC PANEL WITH GFR
ALT: 14 U/L (ref 0–35)
AST: 15 U/L (ref 0–37)
Albumin: 4.3 g/dL (ref 3.5–5.2)
Alkaline Phosphatase: 74 U/L (ref 39–117)
BUN: 18 mg/dL (ref 6–23)
CO2: 33 meq/L — ABNORMAL HIGH (ref 19–32)
Calcium: 9.7 mg/dL (ref 8.4–10.5)
Chloride: 101 meq/L (ref 96–112)
Creatinine, Ser: 0.6 mg/dL (ref 0.40–1.20)
GFR: 87.42 mL/min (ref >60.00)
Glucose, Bld: 94 mg/dL (ref 70–99)
Potassium: 4.1 meq/L (ref 3.5–5.1)
Sodium: 139 meq/L (ref 135–145)
Total Bilirubin: 0.3 mg/dL (ref 0.2–1.2)
Total Protein: 7.2 g/dL (ref 6.0–8.3)

## 2024-02-16 LAB — LDL CHOLESTEROL, DIRECT: Direct LDL: 129 mg/dL

## 2024-02-16 LAB — MICROALBUMIN / CREATININE URINE RATIO
Creatinine,U: 87.5 mg/dL
Microalb Creat Ratio: 11.6 mg/g (ref 0.0–30.0)
Microalb, Ur: 1 mg/dL (ref 0.0–1.9)

## 2024-02-16 LAB — TSH: TSH: 4.2 u[IU]/mL (ref 0.35–5.50)

## 2024-02-16 LAB — HEMOGLOBIN A1C: Hgb A1c MFr Bld: 6.3 % (ref 4.6–6.5)

## 2024-02-16 MED ORDER — AMLODIPINE BESYLATE 2.5 MG PO TABS: 2.5000 mg | ORAL_TABLET | Freq: Every day | ORAL | 1 refills | Status: AC

## 2024-02-16 NOTE — Patient Instructions (Signed)
 Good to see  you!  Glad you and Darina are clearly thriving!  I'll refill the thryoid and Crestor  after reviewing your labs today   You are due  for colonoscopy May 2026  I recommend staggering your flu and COVID vaccinations by 2 weeks

## 2024-02-16 NOTE — Progress Notes (Signed)
 Subjective:  Patient ID: Tiffany Chang, female    DOB: Aug 02, 1945  Age: 78 y.o. MRN: [id number]  CC: The primary encounter diagnosis was Essential hypertension. 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   HPI Tiffany Chang presents for  Chief Complaint  Patient presents with   Medical Management of Chronic Issues    6 month follow up    1) s/p right hip replacement May 19.  Had a lot of post operative pain (posterior approach)  was off of opioids by Day 8 . Feels that her recovery was slower than anticipated because she has conservative  . Had  PT for the last 2 months which made significant  impact on recovery, PT completed 2 weeks ago. She is walking at least 1.5 miles daily and joining  gym ,. Want to lose the weight she gained.  Hip  Feels odd but not painful  getting out of chair . Reviewed July Orthopedics note:  , I provided a referral to formal physical therapy, 2x per week for 6 weeks. I encouraged the patient to perform daily home exercises learned from physical therapy. The patient has been instructed to call if symptoms persist or worsen. Follow up PRN.   2) osteopenia  by last DEXA  : alendronate  stopped by oncologist due to oimporvement in BMD . Repeat DEXA  planned next year,  year 10 of surviiving BRCA   3) grandson born 8 months ago after Lauraine undergoing 18 months of IVF.  Mother and baby both healthy   4) Constipation:  improved with Randy's regimen of colace bid, avoidance of NSAIDs,  and increased water  intake.   Tiffany Chang and Tiffany Chang have placed themselves on waiting lists of several transitional retirement communities  with plans to transition in 3-4 years.    Outpatient Medications Prior to Visit  Medication Sig Dispense Refill   acetaminophen   (TYLENOL ) 500 MG tablet Take 500 mg by mouth every 6 (six) hours as needed.     Cholecalciferol (VITAMIN D3) 50 MCG (2000 UT) TABS Take 1 tablet by mouth daily.      levothyroxine  (SYNTHROID ) 88 MCG tablet TAKE 1 TABLET BY MOUTH EVERY DAY 90 tablet 1   naproxen sodium (ALEVE) 220 MG tablet Take 220 mg by mouth 2 (two) times daily as needed (pain).     primidone  (MYSOLINE ) 250 MG tablet TAKE 1/2 TABLET BY MOUTH EVERY MORNING AND AT BEDTIME 90 tablet 0   propranolol  (INDERAL ) 10 MG tablet Take 1 tablet (10 mg total) by mouth 2 (two) times daily. 180 tablet 2   rosuvastatin  (CRESTOR ) 5 MG tablet TAKE 1 TABLET (5 MG TOTAL) BY MOUTH DAILY. 90 tablet 2   amLODipine  (NORVASC ) 2.5 MG tablet TAKE 1 TABLET BY MOUTH EVERY DAY 90 tablet 1   fluticasone  (FLONASE ) 50 MCG/ACT nasal spray Place 2 sprays into both nostrils daily. For allergic rhinitis 16 g 6   lubiprostone  (AMITIZA ) 8 MCG capsule Take 1 capsule (8 mcg total) by mouth 2 (two) times daily with a meal. 180 capsule 0   tranexamic acid (LYSTEDA) 650 MG TABS tablet      No facility-administered medications prior to visit.    Review of Systems;  Patient denies headache, fevers, malaise, unintentional weight loss, skin rash, eye pain, sinus congestion and sinus pain, sore throat, dysphagia,  hemoptysis , cough, dyspnea,  wheezing, chest pain, palpitations, orthopnea, edema, abdominal pain, nausea, melena, diarrhea, constipation, flank pain, dysuria, hematuria, urinary  Frequency, nocturia, numbness, tingling, seizures,  Focal weakness, Loss of consciousness,  Tremor, insomnia, depression, anxiety, and suicidal ideation.      Objective:  BP 118/70   Pulse 71   Ht 5' 7 (1.702 m)   Wt 168 lb (76.2 kg)   SpO2 98%   BMI 26.31 kg/m   BP Readings from Last 3 Encounters:  02/16/24 118/70  08/11/23 122/80  08/07/23 120/70    Wt Readings from Last 3 Encounters:  02/16/24 168 lb (76.2 kg)  08/11/23 164 lb 14.4 oz (74.8 kg)  08/07/23 167 lb 12.8  oz (76.1 kg)    Physical Exam Vitals reviewed.  Constitutional:      General: She is not in acute distress.    Appearance: Normal appearance. She is normal weight. She is not ill-appearing, toxic-appearing or diaphoretic.  HENT:     Head: Normocephalic.  Eyes:     General: No scleral icterus.       Right eye: No discharge.        Left eye: No discharge.     Conjunctiva/sclera: Conjunctivae normal.  Cardiovascular:     Rate and Rhythm: Normal rate and regular rhythm.     Heart sounds: Normal heart sounds.  Pulmonary:     Effort: Pulmonary effort is normal. No respiratory distress.     Breath sounds: Normal breath sounds.  Musculoskeletal:        General: Normal range of motion.  Skin:    General: Skin is warm and dry.  Neurological:     General: No focal deficit present.     Mental Status: She is alert and oriented to person, place, and time. Mental status is at baseline.  Psychiatric:        Mood and Affect: Mood normal.        Behavior: Behavior normal.        Thought Content: Thought content normal.        Judgment: Judgment normal.     Lab Results  Component Value Date   HGBA1C 5.9 (H) 08/07/2023   HGBA1C 6.0 02/11/2023   HGBA1C 6.1 08/13/2022    Lab Results  Component Value Date   CREATININE 0.62 08/07/2023   CREATININE 0.63 02/11/2023   CREATININE 0.61 08/13/2022    Lab Results  Component Value Date   WBC 7.2 08/07/2023   HGB 13.9 08/07/2023   HCT 41.2 08/07/2023   PLT 302 08/07/2023   GLUCOSE 93 08/07/2023   CHOL 202 (H) 08/07/2023   TRIG 199 (H) 08/07/2023   HDL 49 (L) 08/07/2023   LDLDIRECT 109.0 08/13/2022   LDLCALC 121 (H) 08/07/2023   ALT 16 08/07/2023   AST 14 08/07/2023   NA 141 08/07/2023   K 5.0 08/07/2023   CL 103 08/07/2023   CREATININE 0.62 08/07/2023   BUN 22 08/07/2023   CO2 28 08/07/2023   TSH 2.95 08/07/2023   HGBA1C 5.9 (H) 08/07/2023    MM 3D SCREENING MAMMOGRAM UNILATERAL LEFT BREAST Result Date: 11/30/2023 CLINICAL  DATA:  Screening. EXAM: DIGITAL SCREENING UNILATERAL LEFT MAMMOGRAM WITH CAD AND TOMOSYNTHESIS TECHNIQUE: Left screening digital craniocaudal and mediolateral oblique mammograms were obtained. Left screening digital breast tomosynthesis was performed. The images were evaluated with computer-aided detection. COMPARISON:  Previous exam(s). ACR Breast Density Category c: The breasts are heterogeneously dense, which may obscure small masses. FINDINGS: There are no findings suspicious for malignancy. IMPRESSION:  No mammographic evidence of malignancy. A result letter of this screening mammogram will be mailed directly to the patient. RECOMMENDATION: Screening mammogram in one year. (Code:SM-B-01Y) BI-RADS CATEGORY  1: Negative. Electronically Signed   By: Alm Parkins M.D.   On: 11/30/2023 12:31   DG BONE DENSITY (DXA) Result Date: 11/25/2023 EXAM: DUAL X-RAY ABSORPTIOMETRY (DXA) FOR BONE MINERAL DENSITY 11/25/2023 2:35 pm CLINICAL DATA:  78 year old Female Postmenopausal. Screening for osteoporosis Patient is or has been on bone building therapies. TECHNIQUE: An axial (e.g., hips, spine) and/or appendicular (e.g., radius) exam was performed, as appropriate, using GE Secretary/administrator at [facility]. Images are obtained for bone mineral density measurement and are not obtained for diagnostic purposes. [id number] Exclusions: L4 due to degenerative changes, right hip due to replacement COMPARISON:  11/24/2022 FINDINGS: Scan quality: Good. LUMBAR SPINE (L1-L3): BMD (in g/cm2): 1.155 T-score: -0.2 Z-score: 1.5 Rate of change from previous exam: 4.9 % LEFT FEMORAL NECK: BMD (in g/cm2): 0.868 T-score: -1.2 Z-score: 0.7 LEFT TOTAL HIP: BMD (in g/cm2): 0.934 T-score: -0.6 Z-score: 1.2 Rate of change from previous exam: 2.5 % FRAX 10-YEAR PROBABILITY OF FRACTURE: FRAX not reported as the patient is receiving bone building therapy. IMPRESSION: Osteopenia based on BMD. Fracture risk is unknown due to  history of bone building therapy. RECOMMENDATIONS: 1. All patients should optimize calcium  and vitamin D  intake. 2. Consider FDA-approved medical therapies in postmenopausal women and men aged 59 years and older, based on the following: - A hip or vertebral (clinical or morphometric) fracture - T-score less than or equal to -2.5 and secondary causes have been excluded. - Low bone mass (T-score between -1.0 and -2.5) and a 10-year probability of a hip fracture greater than or equal to 3% or a 10-year probability of a major osteoporosis-related fracture greater than or equal to 20% based on the [place] -adapted WHO algorithm. - Clinician judgment and/or patient preferences may indicate treatment for people with 10-year fracture probabilities above or below these levels 3. Patients with diagnosis of osteoporosis or at high risk for fracture should have regular bone mineral density tests. For patients eligible for Medicare, routine testing is allowed once every 2 years. The testing frequency can be increased to one year for patients who have rapidly progressing disease, those who are receiving or discontinuing medical therapy to restore bone mass, or have additional risk factors. Electronically Signed   By: Dina  Arceo M.D.   On: 11/25/2023 16:33    Assessment & Plan:  .Essential hypertension Assessment & Plan: Well controlled on minimal dose of amlodipine  . Renal function  due , no changes today.   Orders: -     Comprehensive metabolic panel with GFR -     Microalbumin / creatinine urine ratio  Acquired hypothyroidism Assessment & Plan: TSH has normalized on  88 mcg . Repeat level due   Lab Results  Component Value Date   TSH 2.95 08/07/2023     Orders: -     TSH  Hyperlipidemia, unspecified hyperlipidemia type -     Lipid panel -     LDL cholesterol, direct  Prediabetes -     Comprehensive metabolic panel with GFR -     Hemoglobin A1c  Osteoporosis of forearm without pathological  fracture Assessment & Plan: No longer on alendronate  , started after 5 years of EVista.  prescribed by Dr Jacobo for progression of T scores. BMD in spine has improved from 2022 to 2025  She has requested estrogen  therapy   Chronic constipation Assessment & Plan: Now managed effectively with increased water  intake and twice daily use of colace    Tubular adenoma of colon Assessment & Plan: She is due in May 2026 and plans to have it done in [place].     S/P hip replacement, right Assessment & Plan: Done in [place] in May 2025.  Recovery has been slower than patient anticipated due to her taking a conservative approach,  but she is pain free and has completed PT   Other orders -     amLODIPine  Besylate; Take 1 tablet (2.5 mg total) by mouth daily.  Dispense: 90 tablet; Refill: 1   I personally spent a total of 40 minutes in the care of the patient today including preparing to see the patient, getting/reviewing separately obtained history, performing a medically appropriate exam/evaluation, counseling and educating, placing orders, documenting clinical information in the EHR, and independently interpreting results.  Follow-up: Return in about 6 months (around 08/15/2024) for physical.   Verneita LITTIE Kettering, MD

## 2024-02-16 NOTE — Assessment & Plan Note (Signed)
 No longer on alendronate  , started after 5 years of EVista.  prescribed by Dr Jacobo for progression of T scores. BMD in spine has improved from 2022 to 2025  She has requested estrogen therapy

## 2024-02-16 NOTE — Assessment & Plan Note (Signed)
 Well controlled on minimal dose of amlodipine  . Renal function  due , no changes today.

## 2024-02-16 NOTE — Assessment & Plan Note (Signed)
 TSH has normalized on  88 mcg . Repeat level due   Lab Results  Component Value Date   TSH 2.95 08/07/2023

## 2024-02-16 NOTE — Assessment & Plan Note (Signed)
 She is due in May 2026 and plans to have it done in [place].

## 2024-02-16 NOTE — Assessment & Plan Note (Signed)
 Done in [place] in May 2025.  Recovery has been slower than patient anticipated due to her taking a conservative approach,  but she is pain free and has completed PT

## 2024-02-16 NOTE — Assessment & Plan Note (Signed)
 Now managed effectively with increased water  intake and twice daily use of colace

## 2024-02-17 ENCOUNTER — Ambulatory Visit: Payer: Self-pay | Admitting: Internal Medicine

## 2024-02-29 ENCOUNTER — Other Ambulatory Visit: Payer: Self-pay | Admitting: Neurology

## 2024-02-29 DIAGNOSIS — G250 Essential tremor: Secondary | ICD-10-CM

## 2024-03-18 ENCOUNTER — Other Ambulatory Visit: Payer: Self-pay | Admitting: Internal Medicine

## 2024-04-20 ENCOUNTER — Ambulatory Visit (INDEPENDENT_AMBULATORY_CARE_PROVIDER_SITE_OTHER): Admitting: *Deleted

## 2024-04-20 VITALS — Ht 67.0 in | Wt 163.0 lb

## 2024-04-20 DIAGNOSIS — Z0000 Encounter for general adult medical examination without abnormal findings: Secondary | ICD-10-CM | POA: Diagnosis not present

## 2024-04-20 NOTE — Progress Notes (Signed)
 Chief Complaint  Patient presents with   Medicare Wellness     Subjective:   Tiffany Chang is a 78 y.o. female who presents for a Medicare Annual Wellness Visit.  Visit info / Clinical Intake: Medicare Wellness Visit Type:: Subsequent Annual Wellness Visit Persons participating in visit and providing information:: patient Medicare Wellness Visit Mode:: Telephone If telephone:: video declined Since this visit was completed virtually, some vitals may be partially provided or unavailable. Missing vitals are due to the limitations of the virtual format.: Unable to obtain vitals - no equipment If Telephone or Video please confirm:: I connected with patient using audio/video enable telemedicine. I verified patient identity with two identifiers, discussed telehealth limitations, and patient agreed to proceed. Patient Location:: Home Provider Location:: Office/Home Interpreter Needed?: No Pre-visit prep was completed: yes AWV questionnaire completed by patient prior to visit?: yes Date:: 04/19/24 Living arrangements:: lives with spouse/significant other Patient's Overall Health Status Rating: very good Typical amount of pain: none Does pain affect daily life?: no Are you currently prescribed opioids?: no  Dietary Habits and Nutritional Risks How many meals a day?: 3 Eats fruit and vegetables daily?: yes Most meals are obtained by: preparing own meals In the last 2 weeks, have you had any of the following?: none Diabetic:: no  Functional Status Activities of Daily Living (to include ambulation/medication): Independent Ambulation: Independent Medication Administration: Independent Home Management (perform basic housework or laundry): Independent Manage your own finances?: yes Primary transportation is: driving Concerns about vision?: no *vision screening is required for WTM* Concerns about hearing?: no  Fall Screening Falls in the past year?: 0 Number of falls in past year:  0 Was there an injury with Fall?: 0 Fall Risk Category Calculator: 0 Patient Fall Risk Level: Low Fall Risk  Fall Risk Patient at Risk for Falls Due to: No Fall Risks Fall risk Follow up: Falls evaluation completed; Falls prevention discussed  Home and Transportation Safety: All rugs have non-skid backing?: yes All stairs or steps have railings?: yes Grab bars in the bathtub or shower?: yes Have non-skid surface in bathtub or shower?: yes Good home lighting?: yes Regular seat belt use?: yes Hospital stays in the last year:: no  Cognitive Assessment Difficulty concentrating, remembering, or making decisions? : no Will 6CIT or Mini Cog be Completed: yes What year is it?: 0 points What month is it?: 0 points Give patient an address phrase to remember (5 components): [address] [place] About what time is it?: 0 points Count backwards from 20 to 1: 0 points Say the months of the year in reverse: 0 points Repeat the address phrase from earlier: 0 points 6 CIT Score: 0 points  Advance Directives (For Healthcare) Does Patient Have a Medical Advance Directive?: Yes Does patient want to make changes to medical advance directive?: No - Patient declined Type of Advance Directive: Healthcare Power of [place]; Living will Copy of Healthcare Power of Attorney in Chart?: No - copy requested Copy of Living Will in Chart?: No - copy requested  Reviewed/Updated  Reviewed/Updated: Reviewed All (Medical, Surgical, Family, Medications, Allergies, Care Teams, Patient Goals)    Allergies (verified) Statins and Bee venom   Current Medications (verified) Outpatient Encounter Medications as of 04/20/2024  Medication Sig   acetaminophen  (TYLENOL ) 500 MG tablet Take 500 mg by mouth every 6 (six) hours as needed.   amLODipine  (NORVASC ) 2.5 MG tablet Take 1 tablet (2.5 mg total) by mouth daily.   Calcium  Carbonate (CALTRATE 600 PO) Take  by mouth daily.   Cholecalciferol (VITAMIN D3) 50  MCG (2000 UT) TABS Take 1 tablet by mouth daily.    Docusate Sodium (COLACE PO) Take by mouth 2 (two) times daily.   levothyroxine  (SYNTHROID ) 88 MCG tablet TAKE 1 TABLET BY MOUTH EVERY DAY   naproxen sodium (ALEVE) 220 MG tablet Take 220 mg by mouth 2 (two) times daily as needed (pain).   primidone  (MYSOLINE ) 250 MG tablet TAKE 1/2 TABLET BY MOUTH EVERY MORNING AND AT BEDTIME   propranolol  (INDERAL ) 10 MG tablet Take 1 tablet (10 mg total) by mouth 2 (two) times daily.   rosuvastatin  (CRESTOR ) 5 MG tablet TAKE 1 TABLET (5 MG TOTAL) BY MOUTH DAILY.   No facility-administered encounter medications on file as of 04/20/2024.    History: Past Medical History:  Diagnosis Date   Arthritis    Breast cancer (HCC) 2016   ILC - RT MASTECTOMY   Hashimoto's thyroiditis    History of arthroscopy of left knee 1985   Hyperlipidemia    mild, with prior statin intolerance   Hypothyroid    Osteoporosis    prior Evista use x 5 yrs , 2007   Spinal stenosis    mild   Syncope, vasovagal    3 episodes   Tremor    Viral meningitis    78 years old   Past Surgical History:  Procedure Laterality Date   BREAST SURGERY     CATARACT EXTRACTION W/PHACO Left 09/30/2017   Procedure: CATARACT EXTRACTION PHACO AND INTRAOCULAR LENS PLACEMENT (IOC);  Surgeon: Jaye Fallow, MD;  Location: [facility];  Service: Ophthalmology;  Laterality: Left;  US  00:40 AP% 15.7 CDE 6.38 Fluid pack lot # [id number]   CATARACT EXTRACTION W/PHACO Right 06/07/2019   Procedure: CATARACT EXTRACTION PHACO AND INTRAOCULAR LENS PLACEMENT (IOC) RIGHT 4.62 00:33.5;  Surgeon: Jaye Fallow, MD;  Location: [facility];  Service: Ophthalmology;  Laterality: Right;   CESAREAN SECTION     CHOLECYSTECTOMY  05/19/2004   COLONOSCOPY WITH PROPOFOL  N/A 09/26/2019   Procedure: COLONOSCOPY WITH BIOPSY;  Surgeon: Jinny Carmine, MD;  Location: [facility];  Service: Endoscopy;  Laterality: N/A;  priority 3   FOOT SURGERY      plantar fascitis, Dr. Lilli   KNEE ARTHROSCOPY  05/20/1983   right   MASTECTOMY Right 05/19/2014   ILC   POLYPECTOMY N/A 09/26/2019   Procedure: POLYPECTOMY;  Surgeon: Jinny Carmine, MD;  Location: [facility];  Service: Endoscopy;  Laterality: N/A;   SENTINEL NODE BIOPSY Right 10/10/2014   Procedure: SENTINEL NODE BIOPSY;  Surgeon: Larinda Unknown Sharps, MD;  Location: [facility];  Service: General;  Laterality: Right;   SIMPLE MASTECTOMY WITH AXILLARY SENTINEL NODE BIOPSY Right 10/10/2014   Procedure: SIMPLE MASTECTOMY;  Surgeon: Larinda Unknown Sharps, MD;  Location: [facility];  Service: General;  Laterality: Right;   TONSILLECTOMY     TOTAL HIP ARTHROPLASTY Right 10/05/2023   Family History  Problem Relation Age of Onset   Osteoporosis Mother    Hearing loss Mother    Hypertension Mother    Aortic aneurysm Father    COPD Sister    Cancer Sister 75       Breast   Breast cancer Sister 27   Cancer Maternal Grandfather        breast   Breast cancer Maternal Grandmother 62   Cancer Sister    Mental illness Sister    Social History   Occupational History    Employer: the [employer]  [employer]    Comment: exercise physiologist  Tobacco Use   Smoking status: Never   Smokeless tobacco: Never  Vaping Use   Vaping status: Never Used  Substance and Sexual Activity   Alcohol use: Yes    Alcohol/week: 3.0 standard drinks of alcohol    Types: 3 Standard drinks or equivalent per week    Comment: social, 2 times per week   Drug use: No   Sexual activity: Not Currently   Tobacco Counseling Counseling given: Not Answered  SDOH Screenings   Food Insecurity: No Food Insecurity (04/19/2024)  Housing: Low Risk  (04/19/2024)  Transportation Needs: No Transportation Needs (04/19/2024)  Utilities: Not At Risk (04/20/2024)  Alcohol Screen: Low Risk  (04/19/2024)  Depression (PHQ2-9): Low Risk  (04/20/2024)  Financial Resource Strain: Low Risk  (04/19/2024)  Physical  Activity: Sufficiently Active (04/19/2024)  Social Connections: Socially Integrated (04/19/2024)  Stress: No Stress Concern Present (04/19/2024)  Tobacco Use: Low Risk  (04/20/2024)  Health Literacy: Adequate Health Literacy (04/20/2024)   See flowsheets for full screening details  Depression Screen PHQ 2 & 9 Depression Scale- Over the past 2 weeks, how often have you been bothered by any of the following problems? Little interest or pleasure in doing things: 0 Feeling down, depressed, or hopeless (PHQ Adolescent also includes...irritable): 0 PHQ-2 Total Score: 0 Trouble falling or staying asleep, or sleeping too much: 0 Feeling tired or having little energy: 0 Poor appetite or overeating (PHQ Adolescent also includes...weight loss): 0 Feeling bad about yourself - or that you are a failure or have let yourself or your family down: 0 Trouble concentrating on things, such as reading the newspaper or watching television (PHQ Adolescent also includes...like school work): 0 Moving or speaking so slowly that other people could have noticed. Or the opposite - being so fidgety or restless that you have been moving around a lot more than usual: 0 Thoughts that you would be better off dead, or of hurting yourself in some way: 0 PHQ-9 Total Score: 0 If you checked off any problems, how difficult have these problems made it for you to do your work, take care of things at home, or get along with other people?: Not difficult at all     Goals Addressed             This Visit's Progress    Patient Stated       Wants to continue to walk and exercise             Objective:    Today's Vitals   04/20/24 0808  Weight: 163 lb (73.9 kg)  Height: 5' 7 (1.702 m)   Body mass index is 25.53 kg/m.  Hearing/Vision screen Hearing Screening - Comments:: No issues Vision Screening - Comments:: Readers, [facility], Up to date Immunizations and Health Maintenance Health Maintenance  Topic Date  Due   Influenza Vaccine  08/16/2024 (Originally 12/18/2023)   COVID-19 Vaccine (8 - Pfizer risk 2025-26 season) 09/05/2024   Colonoscopy  09/25/2024   Mammogram  11/24/2024   Medicare Annual Wellness (AWV)  04/20/2025   DTaP/Tdap/Td (3 - Td or Tdap) 05/27/2033   Pneumococcal Vaccine: 50+ Years  Completed   Bone Density Scan  Completed   Hepatitis C Screening  Completed   Zoster Vaccines- Shingrix  Completed   Meningococcal B Vaccine  Aged Out        Assessment/Plan:  This is a routine wellness examination for Gauri.  Patient Care Team:  Marylynn Verneita CROME, MD as PCP - General (Internal Medicine) Jacobo Evalene PARAS, MD as Consulting Physician (Oncology) Tat, Asberry RAMAN, DO as Consulting Physician (Neurology)  I have personally reviewed and noted the following in the patient's chart:   Medical and social history Use of alcohol, tobacco or illicit drugs  Current medications and supplements including opioid prescriptions. Functional ability and status Nutritional status Physical activity Advanced directives List of other physicians Hospitalizations, surgeries, and ER visits in previous 12 months Vitals Screenings to include cognitive, depression, and falls Referrals and appointments  No orders of the defined types were placed in this encounter.  In addition, I have reviewed and discussed with patient certain preventive protocols, quality metrics, and best practice recommendations. A written personalized care plan for preventive services as well as general preventive health recommendations were provided to patient.   Angeline Fredericks, LPN   [id number]   Return in 1 year (on 04/20/2025).  After Visit Summary: (MyChart) Due to this being a telephonic visit, the after visit summary with patients personalized plan was offered to patient via MyChart   Nurse Notes: Discussed the need to keep vaccines up to date.

## 2024-04-20 NOTE — Patient Instructions (Signed)
 Tiffany Chang,  Thank you for taking the time for your Medicare Wellness Visit. I appreciate your continued commitment to your health goals. Please review the care plan we discussed, and feel free to reach out if I can assist you further.  Please note that Annual Wellness Visits do not include a physical exam. Some assessments may be limited, especially if the visit was conducted virtually. If needed, we may recommend an in-person follow-up with your provider.  Ongoing Care Seeing your primary care provider every 3 to 6 months helps us  monitor your health and provide consistent, personalized care.  Remember to keep vaccines up to date.  Referrals If a referral was made during today's visit and you haven't received any updates within two weeks, please contact the referred provider directly to check on the status.  Recommended Screenings:  Health Maintenance  Topic Date Due   COVID-19 Vaccine (8 - Pfizer risk 2025-26 season) 09/05/2024   Colon Cancer Screening  09/25/2024   Breast Cancer Screening  11/24/2024   Medicare Annual Wellness Visit  04/20/2025   DTaP/Tdap/Td vaccine (3 - Td or Tdap) 05/27/2033   Pneumococcal Vaccine for age over 11  Completed   Flu Shot  Completed   Osteoporosis screening with Bone Density Scan  Completed   Hepatitis C Screening  Completed   Zoster (Shingles) Vaccine  Completed   Meningitis B Vaccine  Aged Out       04/20/2024    8:14 AM  Advanced Directives  Does Patient Have a Medical Advance Directive? Yes  Type of Estate Agent of [place];Living will  Does patient want to make changes to medical advance directive? No - Patient declined  Copy of Healthcare Power of Attorney in Chart? No - copy requested    Vision: Annual vision screenings are recommended for early detection of glaucoma, cataracts, and diabetic retinopathy. These exams can also reveal signs of chronic conditions such as diabetes and high blood pressure.  Dental:  Annual dental screenings help detect early signs of oral cancer, gum disease, and other conditions linked to overall health, including heart disease and diabetes.  Please see the attached documents for additional preventive care recommendations.

## 2024-05-29 ENCOUNTER — Other Ambulatory Visit: Payer: Self-pay | Admitting: Neurology

## 2024-05-29 DIAGNOSIS — G250 Essential tremor: Secondary | ICD-10-CM

## 2024-08-11 ENCOUNTER — Ambulatory Visit: Admitting: Neurology

## 2024-08-18 ENCOUNTER — Ambulatory Visit: Admitting: Internal Medicine

## 2024-12-15 ENCOUNTER — Telehealth: Admitting: Oncology

## 2025-04-25 ENCOUNTER — Ambulatory Visit
